# Patient Record
Sex: Female | Born: 1940
Health system: Southern US, Community
[De-identification: ages and names within clinical notes are randomized; demographics above are authoritative.]

## PROBLEM LIST (undated history)

## (undated) DIAGNOSIS — K56609 Unspecified intestinal obstruction, unspecified as to partial versus complete obstruction: Secondary | ICD-10-CM

## (undated) DIAGNOSIS — R51 Headache: Secondary | ICD-10-CM

## (undated) DIAGNOSIS — M545 Low back pain, unspecified: Secondary | ICD-10-CM

## (undated) DIAGNOSIS — E785 Hyperlipidemia, unspecified: Secondary | ICD-10-CM

## (undated) DIAGNOSIS — G8929 Other chronic pain: Secondary | ICD-10-CM

## (undated) DIAGNOSIS — K219 Gastro-esophageal reflux disease without esophagitis: Secondary | ICD-10-CM

## (undated) DIAGNOSIS — M1990 Unspecified osteoarthritis, unspecified site: Secondary | ICD-10-CM

## (undated) DIAGNOSIS — I10 Essential (primary) hypertension: Secondary | ICD-10-CM

## (undated) HISTORY — DX: Gastro-esophageal reflux disease without esophagitis: K21.9

## (undated) HISTORY — PX: INGUINAL HERNIA REPAIR: SUR1180

## (undated) HISTORY — DX: Hyperlipidemia, unspecified: E78.5

## (undated) HISTORY — DX: Essential (primary) hypertension: I10

---

## 1979-06-08 HISTORY — PX: APPENDECTOMY: SHX54

## 1979-06-08 HISTORY — PX: ABDOMINAL HYSTERECTOMY: SHX81

## 1998-10-07 HISTORY — PX: BILATERAL OOPHORECTOMY: SHX1221

## 1999-07-31 ENCOUNTER — Ambulatory Visit: Admission: RE | Admit: 1999-07-31 | Discharge: 1999-07-31 | Payer: Self-pay | Admitting: Gynecology

## 1999-08-01 ENCOUNTER — Inpatient Hospital Stay (HOSPITAL_COMMUNITY): Admission: RE | Admit: 1999-08-01 | Discharge: 1999-08-03 | Payer: Self-pay | Admitting: Gynecology

## 1999-08-01 ENCOUNTER — Encounter (INDEPENDENT_AMBULATORY_CARE_PROVIDER_SITE_OTHER): Payer: Self-pay

## 1999-08-04 ENCOUNTER — Inpatient Hospital Stay (HOSPITAL_COMMUNITY): Admission: EM | Admit: 1999-08-04 | Discharge: 1999-08-07 | Payer: Self-pay | Admitting: Gynecology

## 1999-08-04 ENCOUNTER — Encounter: Payer: Self-pay | Admitting: Gynecology

## 1999-12-03 ENCOUNTER — Emergency Department (HOSPITAL_COMMUNITY): Admission: EM | Admit: 1999-12-03 | Discharge: 1999-12-03 | Payer: Self-pay | Admitting: Internal Medicine

## 2003-10-09 ENCOUNTER — Inpatient Hospital Stay (HOSPITAL_COMMUNITY): Admission: EM | Admit: 2003-10-09 | Discharge: 2003-10-16 | Payer: Self-pay | Admitting: Emergency Medicine

## 2003-10-09 ENCOUNTER — Encounter (INDEPENDENT_AMBULATORY_CARE_PROVIDER_SITE_OTHER): Payer: Self-pay | Admitting: Specialist

## 2003-10-09 HISTORY — PX: COLON SURGERY: SHX602

## 2005-05-15 ENCOUNTER — Ambulatory Visit: Payer: Self-pay | Admitting: Cardiology

## 2005-09-15 ENCOUNTER — Emergency Department (HOSPITAL_COMMUNITY): Admission: EM | Admit: 2005-09-15 | Discharge: 2005-09-16 | Payer: Self-pay | Admitting: Emergency Medicine

## 2005-10-18 ENCOUNTER — Inpatient Hospital Stay (HOSPITAL_COMMUNITY): Admission: EM | Admit: 2005-10-18 | Discharge: 2005-10-23 | Payer: Self-pay | Admitting: Emergency Medicine

## 2008-10-24 ENCOUNTER — Inpatient Hospital Stay (HOSPITAL_COMMUNITY): Admission: EM | Admit: 2008-10-24 | Discharge: 2008-11-02 | Payer: Self-pay | Admitting: Emergency Medicine

## 2008-10-26 ENCOUNTER — Ambulatory Visit: Payer: Self-pay | Admitting: Internal Medicine

## 2010-04-15 ENCOUNTER — Inpatient Hospital Stay (HOSPITAL_COMMUNITY): Admission: EM | Admit: 2010-04-15 | Discharge: 2010-04-21 | Payer: Self-pay | Admitting: Emergency Medicine

## 2010-12-22 LAB — BASIC METABOLIC PANEL
BUN: 9 mg/dL (ref 6–23)
Chloride: 104 mEq/L (ref 96–112)
Creatinine, Ser: 0.82 mg/dL (ref 0.4–1.2)
GFR calc Af Amer: >60 mL/min (ref >60)
Sodium: 141 mEq/L (ref 135–145)

## 2010-12-23 LAB — COMPREHENSIVE METABOLIC PANEL
ALT: 16 U/L (ref 0–35)
ALT: 23 U/L (ref 0–35)
AST: 16 U/L (ref 0–37)
AST: 20 U/L (ref 0–37)
AST: 27 U/L (ref 0–37)
Albumin: 2.6 g/dL — ABNORMAL LOW (ref 3.5–5.2)
Albumin: 3.9 g/dL (ref 3.5–5.2)
Alkaline Phosphatase: 56 U/L (ref 39–117)
Alkaline Phosphatase: 65 U/L (ref 39–117)
Alkaline Phosphatase: 81 U/L (ref 39–117)
CO2: 28 mEq/L (ref 19–32)
CO2: 29 mEq/L (ref 19–32)
Calcium: 8.6 mg/dL (ref 8.4–10.5)
Chloride: 108 mEq/L (ref 96–112)
Creatinine, Ser: 0.69 mg/dL (ref 0.4–1.2)
Creatinine, Ser: 0.95 mg/dL (ref 0.4–1.2)
GFR calc Af Amer: >60 mL/min (ref >60)
GFR calc Af Amer: >60 mL/min (ref >60)
GFR calc non Af Amer: >60 mL/min (ref >60)
GFR calc non Af Amer: >60 mL/min (ref >60)
Potassium: 3.1 mEq/L — ABNORMAL LOW (ref 3.5–5.1)
Potassium: 3.9 mEq/L (ref 3.5–5.1)
Sodium: 141 mEq/L (ref 135–145)
Sodium: 142 mEq/L (ref 135–145)
Total Bilirubin: 0.4 mg/dL (ref 0.3–1.2)
Total Bilirubin: 0.4 mg/dL (ref 0.3–1.2)
Total Bilirubin: 0.5 mg/dL (ref 0.3–1.2)
Total Protein: 6.3 g/dL (ref 6.0–8.3)

## 2010-12-23 LAB — LIPASE, BLOOD: Lipase: 26 U/L (ref 11–59)

## 2010-12-23 LAB — URINALYSIS, ROUTINE W REFLEX MICROSCOPIC
Hgb urine dipstick: NEGATIVE
Ketones, ur: NEGATIVE mg/dL
Nitrite: NEGATIVE
Specific Gravity, Urine: 1.024 (ref 1.005–1.030)
pH: 6 (ref 5.0–8.0)

## 2010-12-23 LAB — CBC
HCT: 36.4 % (ref 36.0–46.0)
HCT: 36.8 % (ref 36.0–46.0)
Hemoglobin: 12.4 g/dL (ref 12.0–15.0)
Hemoglobin: 9.8 g/dL — ABNORMAL LOW (ref 12.0–15.0)
MCH: 30.5 pg (ref 26.0–34.0)
MCHC: 33.8 g/dL (ref 30.0–36.0)
MCHC: 34.2 g/dL (ref 30.0–36.0)
MCV: 89.2 fL (ref 78.0–100.0)
Platelets: 150 10*3/uL (ref 150–400)
Platelets: 169 10*3/uL (ref 150–400)
RBC: 3.27 MIL/uL — ABNORMAL LOW (ref 3.87–5.11)
RBC: 4.07 MIL/uL (ref 3.87–5.11)
RDW: 13.6 % (ref 11.5–15.5)
WBC: 6.9 10*3/uL (ref 4.0–10.5)

## 2010-12-23 LAB — BASIC METABOLIC PANEL
BUN: 11 mg/dL (ref 6–23)
Creatinine, Ser: 0.68 mg/dL (ref 0.4–1.2)
GFR calc non Af Amer: >60 mL/min (ref >60)
Glucose, Bld: 99 mg/dL (ref 70–99)
Potassium: 3.3 mEq/L — ABNORMAL LOW (ref 3.5–5.1)

## 2010-12-23 LAB — DIFFERENTIAL
Basophils Absolute: 0 10*3/uL (ref 0.0–0.1)
Eosinophils Relative: 0 % (ref 0–5)
Neutrophils Relative %: 90 % — ABNORMAL HIGH (ref 43–77)

## 2011-01-21 LAB — PHOSPHORUS: Phosphorus: 1.6 mg/dL — ABNORMAL LOW (ref 2.3–4.6)

## 2011-01-21 LAB — BASIC METABOLIC PANEL
BUN: 13 mg/dL (ref 6–23)
BUN: 2 mg/dL — ABNORMAL LOW (ref 6–23)
BUN: 5 mg/dL — ABNORMAL LOW (ref 6–23)
CO2: 24 mEq/L (ref 19–32)
CO2: 27 mEq/L (ref 19–32)
CO2: 29 mEq/L (ref 19–32)
CO2: 31 mEq/L (ref 19–32)
Calcium: 7.9 mg/dL — ABNORMAL LOW (ref 8.4–10.5)
Calcium: 8.1 mg/dL — ABNORMAL LOW (ref 8.4–10.5)
Calcium: 8.3 mg/dL — ABNORMAL LOW (ref 8.4–10.5)
Chloride: 102 mEq/L (ref 96–112)
Chloride: 106 mEq/L (ref 96–112)
Chloride: 99 mEq/L (ref 96–112)
Creatinine, Ser: 0.68 mg/dL (ref 0.4–1.2)
GFR calc Af Amer: >60 mL/min (ref >60)
GFR calc Af Amer: >60 mL/min (ref >60)
GFR calc Af Amer: >60 mL/min (ref >60)
GFR calc non Af Amer: >60 mL/min (ref >60)
GFR calc non Af Amer: >60 mL/min (ref >60)
GFR calc non Af Amer: >60 mL/min (ref >60)
Glucose, Bld: 115 mg/dL — ABNORMAL HIGH (ref 70–99)
Glucose, Bld: 116 mg/dL — ABNORMAL HIGH (ref 70–99)
Glucose, Bld: 123 mg/dL — ABNORMAL HIGH (ref 70–99)
Glucose, Bld: 126 mg/dL — ABNORMAL HIGH (ref 70–99)
Potassium: 3.2 mEq/L — ABNORMAL LOW (ref 3.5–5.1)
Potassium: 3.3 mEq/L — ABNORMAL LOW (ref 3.5–5.1)
Potassium: 4.1 mEq/L (ref 3.5–5.1)
Sodium: 134 mEq/L — ABNORMAL LOW (ref 135–145)
Sodium: 136 mEq/L (ref 135–145)
Sodium: 137 mEq/L (ref 135–145)
Sodium: 139 mEq/L (ref 135–145)

## 2011-01-21 LAB — URINALYSIS, ROUTINE W REFLEX MICROSCOPIC
Bilirubin Urine: NEGATIVE
Glucose, UA: NEGATIVE mg/dL
Hgb urine dipstick: NEGATIVE
Hgb urine dipstick: NEGATIVE
Ketones, ur: NEGATIVE mg/dL
Protein, ur: NEGATIVE mg/dL
Specific Gravity, Urine: 1.025 (ref 1.005–1.030)
Specific Gravity, Urine: 1.031 — ABNORMAL HIGH (ref 1.005–1.030)
Urobilinogen, UA: 0.2 mg/dL (ref 0.0–1.0)
pH: 7 (ref 5.0–8.0)

## 2011-01-21 LAB — COMPREHENSIVE METABOLIC PANEL
ALT: 20 U/L (ref 0–35)
AST: 25 U/L (ref 0–37)
CO2: 26 mEq/L (ref 19–32)
CO2: 27 mEq/L (ref 19–32)
Calcium: 9.1 mg/dL (ref 8.4–10.5)
Calcium: 9.9 mg/dL (ref 8.4–10.5)
Chloride: 102 mEq/L (ref 96–112)
Creatinine, Ser: 0.76 mg/dL (ref 0.4–1.2)
GFR calc Af Amer: >60 mL/min (ref >60)
GFR calc non Af Amer: 50 mL/min — ABNORMAL LOW (ref >60)
GFR calc non Af Amer: >60 mL/min (ref >60)
Glucose, Bld: 142 mg/dL — ABNORMAL HIGH (ref 70–99)
Sodium: 139 mEq/L (ref 135–145)

## 2011-01-21 LAB — CBC
HCT: 31.3 % — ABNORMAL LOW (ref 36.0–46.0)
HCT: 31.8 % — ABNORMAL LOW (ref 36.0–46.0)
HCT: 32 % — ABNORMAL LOW (ref 36.0–46.0)
Hemoglobin: 10.1 g/dL — ABNORMAL LOW (ref 12.0–15.0)
Hemoglobin: 10.4 g/dL — ABNORMAL LOW (ref 12.0–15.0)
Hemoglobin: 10.8 g/dL — ABNORMAL LOW (ref 12.0–15.0)
Hemoglobin: 10.9 g/dL — ABNORMAL LOW (ref 12.0–15.0)
Hemoglobin: 12.8 g/dL (ref 12.0–15.0)
MCHC: 33.1 g/dL (ref 30.0–36.0)
MCHC: 33.2 g/dL (ref 30.0–36.0)
MCHC: 33.4 g/dL (ref 30.0–36.0)
MCHC: 33.8 g/dL (ref 30.0–36.0)
MCV: 86.8 fL (ref 78.0–100.0)
MCV: 88.1 fL (ref 78.0–100.0)
MCV: 88.4 fL (ref 78.0–100.0)
MCV: 89.1 fL (ref 78.0–100.0)
Platelets: 182 10*3/uL (ref 150–400)
Platelets: 258 10*3/uL (ref 150–400)
RBC: 3.54 MIL/uL — ABNORMAL LOW (ref 3.87–5.11)
RBC: 3.63 MIL/uL — ABNORMAL LOW (ref 3.87–5.11)
RBC: 3.98 MIL/uL (ref 3.87–5.11)
RBC: 4.32 MIL/uL (ref 3.87–5.11)
RDW: 13.1 % (ref 11.5–15.5)
RDW: 13.3 % (ref 11.5–15.5)
RDW: 13.3 % (ref 11.5–15.5)
RDW: 13.5 % (ref 11.5–15.5)
WBC: 7.2 10*3/uL (ref 4.0–10.5)
WBC: 8 10*3/uL (ref 4.0–10.5)

## 2011-01-21 LAB — DIFFERENTIAL
Lymphocytes Relative: 10 % — ABNORMAL LOW (ref 12–46)
Lymphs Abs: 1.1 10*3/uL (ref 0.7–4.0)
Neutro Abs: 10 10*3/uL — ABNORMAL HIGH (ref 1.7–7.7)
Neutrophils Relative %: 87 % — ABNORMAL HIGH (ref 43–77)

## 2011-01-21 LAB — HEPATIC FUNCTION PANEL
Alkaline Phosphatase: 52 U/L (ref 39–117)
Indirect Bilirubin: 0.3 mg/dL (ref 0.3–0.9)
Total Bilirubin: 0.5 mg/dL (ref 0.3–1.2)

## 2011-01-21 LAB — GASTRIC OCCULT BLOOD (1-CARD TO LAB): Occult Blood, Gastric: POSITIVE — AB

## 2011-01-21 LAB — MAGNESIUM: Magnesium: 1.9 mg/dL (ref 1.5–2.5)

## 2011-01-21 LAB — HEMOGLOBIN AND HEMATOCRIT, BLOOD: HCT: 30.6 % — ABNORMAL LOW (ref 36.0–46.0)

## 2011-01-21 LAB — LIPASE, BLOOD: Lipase: 20 U/L (ref 11–59)

## 2011-02-19 NOTE — Consult Note (Signed)
NAMEZULEYKA, Stacey [name] NO.:  [id number]   MEDICAL RECORD NO.:  [id number]          PATIENT TYPE:  INP   LOCATION:  5151                         FACILITY:  [facility]   PHYSICIAN:  Adolph Pollack, M.D.DATE OF BIRTH:  1941/01/11   DATE OF CONSULTATION:  10/26/2008  DATE OF DISCHARGE:                                 CONSULTATION   REQUESTING PHYSICIAN:  Monte Fantasia, MD   CONSULTING SURGEON:  Adolph Pollack, MD   REASON FOR CONSULTATION:  Partial small bowel obstruction.   HISTORY OF PRESENT ILLNESS:  Stacey [name] is a 70 year old white female  who has a history of hypertension as well as an incarcerated ventral  hernia with small bowel obstruction that was repaired via exploratory  laparotomy in 2007.  Since this surgery, she has not had any problems  with her abdomen until this past Sunday.  After eating dinner, she got  very bloated and uncomfortable.  As a night progressed, the patient had  worsening nausea and vomiting.  On Monday morning, the patient presented  to [facility] Emergency Department and at that time, a CT of the abdomen and  pelvis was obtained, which showed mild small bowel ileus versus mild  partial small bowel obstruction.  At this time, the patient was admitted  and an NG tube was placed.  Repeat abdominal x-rays were obtained the  following day, which showed stable changes in the patient's abdomen.  At  this time secondary to the patient's partial small bowel obstruction, we  were consulted.   REVIEW OF SYSTEMS:  Please see HPI.  Otherwise all other systems are  negative.   PAST MEDICAL HISTORY:  Significant for:  1. Small bowel obstruction, status post closure of lower abdominal      wound ventral hernia with a hernia patch by Dr. Ezzard Standing.  2. Hypertension.   PAST SURGICAL HISTORY:  1. Status post closure of lower abdominal ventral hernia in two layers      with a subfascial placement of medium Ventralex Bard hernia patch      and  then primary closure of abdominal fascia.  2. Laparotomy with extensive lysis of adhesions, small bowel      resection, and repair of incisional hernia by Dr. Johna Sheriff.  3. Status post hysterectomy.   SOCIAL HISTORY:  The patient denies alcohol.  However, reports half a  pack of tobacco use daily.   ALLERGIES:  1. CEPHALOSPORINS.  2. DEMEROL.  3. LEVOFLOXACIN.   MEDICATIONS:  The patient does not take any home medications.   PHYSICAL EXAMINATION:  GENERAL:  Stacey [name] is a 70 year old white  female who is currently lying in bed in no acute distress.  VITAL SIGNS:  Temperature 98.5, pulse 86, respirations 18, and blood  pressure is 113/69.  HEENT:  Head is normocephalic and atraumatic.  Sclerae noninjected.  Pupils are equal, round, and reactive to light.  Ears and nose without  any obvious masses or lesions.  No rhinorrhea.  The patient does have an  NG tube in her nose with bilious output.  Otherwise, her mouth is pink  and  moist and throat shows no exudate.  HEART:  Regular rate and rhythm.  Normal S1 and S2.  No murmurs,  gallops, or rubs are noted, +2 carotid, radial, and pedal pulses  bilaterally.  LUNGS:  Clear to auscultation bilaterally with no wheezes, rhonchi, or  rales noted.  RESPIRATORY:  Effort is nonlabored.  ABDOMEN:  Soft, mildly distended with no bowel sounds.  The patient is  tender along her midline incision and somewhat in the right lower  quadrant.  Otherwise, the patient does have a midline incision from her  prior surgeries.  MUSCULOSKELETAL:  All four extremities are symmetrical with no cyanosis,  clubbing, or edema.  PSYCHIATRIC:  The patient is alert and oriented x3 with an appropriate  affect.   LABORATORY DATA:  White blood cell count is 7500, hemoglobin is 10.8,  hematocrit 32, platelet count is 154,000.  Sodium 136, potassium 3.2,  glucose 129, BUN 23, creatinine 0.85.   DIAGNOSTICS:  CT scan of the abdomen and pelvis reveals mild small  bowel  ileus or partial obstruction and minimal free peritoneal fluid.  Acute  abdominal series on October 25, 2008, shows new small left pleural  effusion with persistent bibasilar atelectasis as well as relatively  gasless abdomen, shows scattered short air-fluid levels with a bowel gas  pattern, which has not markedly changed from the day prior.   IMPRESSION:  1. Partial small bowel obstruction versus ileus.  2. Hypertension.  3. Hypokalemia.   PLAN:  At this time, we agree with conservative management and the  patient continuing with an NG tube as she is not passing any flatus or  having bowel movements at this time.  At this time, the patient does not  appear septic or does not appear to have any need for emergent or urgent  surgical intervention.  Many times an NG tube as well as bowel rest and  conservative management will correct this problem.  I agree with further  x-ray of the abdomen tonight and we will continue to follow this patient  along with you and make further recommendations at that time.      Letha Cape, PA      Adolph Pollack, M.D.  Electronically Signed    KEO/MEDQ  D:  10/26/2008  T:  10/27/2008  Job:  [id number]   cc:   Monte Fantasia, MD

## 2011-02-19 NOTE — Discharge Summary (Signed)
Stacey [name], Stacey [name] NO.:  [id number]   MEDICAL RECORD NO.:  [id number]          PATIENT TYPE:  INP   LOCATION:  5152                         FACILITY:  [facility]   PHYSICIAN:  Monte Fantasia, MD  DATE OF BIRTH:  1941/08/26   DATE OF ADMISSION:  10/24/2008  DATE OF DISCHARGE:                               DISCHARGE SUMMARY   PRIMARY CARE PHYSICIAN:  Sandria Bales. Ezzard Standing, MD   INTERIM DIAGNOSES:  1. Partial small bowel obstruction.  2. Hypokalemia, which is resolved.  3. Hypertension.  4. Hemoccult positive stools.   Discharge medications would be dictated finally at the time of final  discharge.   COURSE DURING THE HOSPITAL STAY:  The patient is a 70 year old Caucasian  lady with history of hypertension and history of incarcerated ventral  hernia with small bowel obstruction, repaired with exploratory  laparotomy in 2007, was admitted on October 24, 2008 with complaints of  abdominal pain.  On admission, abdominal CT scan showed small bowel  obstruction with no evidence of hernia or incarceration.  The patient  was evaluated by Surgery and followed closely by Surgery for the same,  was put on conservative line of management with NG tube.  Repeated  abdominal x-rays were obtained and followed very closely and gradually  started on clear liquids, which she tolerated well.  The patient was  advanced to soft to full diet today and has moved her bowels today.  The  patient denies any abdominal complaints at present and if the patient  tolerates regular diet today, would be possible discharge in a.m.   The patient was also found to be hypokalemic and hyperphosphatemic  during the course of admission.  Potassium and phosphorus both were  repleted for the same.  Also GI evaluation was called in for Hemoccult  positive stools, was recommended to give PPI b.i.d. as an outpatient,  colonoscopy for routine screening.  The Hemoccult positive stools were  possibly secondary  to the small bowel obstruction and to monitor H and H  closely.  The patient did not have any further drops in H and H and  there were no further GI recommendations.   RADIOLOGICAL STUDIES DONE DURING THE STAY IN THE HOSPITAL:  1. Abdominal x-ray done on October 24, 2008.  Impression,      nonobstructive bowel gas pattern.  2. CT scan of the abdomen done on October 24, 2008.  Impression, mild      small bowel ileus with partial obstruction, minimal free peritoneal      fluid.  3. CT of the pelvis.  Impression, mild small bowel ileus, a partial      obstruction, small cystocele.  4. Abdominal x-ray done on October 25, 2008.  Impression, new small      left pleural effusion with persistent bibasilar atelectasis.      Relatively gasless abdomen shows only scattered fluid levels, bowel      gas pattern not markedly changed.  5. Abdominal x-ray done on October 26, 2008.  Impression, questionable      thickening of the splenic flexure of the colon.  6. Abdominal x-ray done on October 28, 2008.  Impression, NG tube      removed, small bowel obstruction gas pattern worsened.  7. Abdominal x-ray done on October 29, 2008.  Impression, nonspecific      bowel gas pattern.  There is gas in the small and large bowel at      least one dilated small bowel loop identified.  Findings not      suggestive for a high-grade obstruction, but there may be residual      partial obstruction or ileus, bibasilar lung opacification      concerning for areas of consolidation and pleural fluid.  8. Abdominal x-ray done on October 31, 2008.  Impression, improvement      of small bowel distention.   Labs done on November 01, 2008; total WBC 6.8, hemoglobin 10.9,  hematocrit 31.8, platelets of 258.  Sodium 139, potassium 4.1, chloride  106, bicarb 26, glucose 126, BUN 2, creatinine 0.7, calcium 8.9,  magnesium 1.9, phosphorus 2.4.   ASSESSMENT AND PLAN:  The patient's small bowel obstruction is gradually  resolving  as been advanced with her to soft to regular referral diet  today.  As per surgery recommendations, the patient denies of any  abdominal cramping at present, also abdominal x-rays have improved.  We  will follow up with as per surgery recommendations and may be possible  discharge in a.m.  We will continue with Protonix 40 mg p.o. b.i.d.      Monte Fantasia, MD  Electronically Signed     MP/MEDQ  D:  11/01/2008  T:  11/02/2008  Job:  [id number]

## 2011-02-19 NOTE — H&P (Signed)
Stacey [name], Stacey [name] NO.:  [id number]   MEDICAL RECORD NO.:  [id number]          PATIENT TYPE:  INP   LOCATION:  5151                         FACILITY:  [facility]   PHYSICIAN:  Renee Ramus, MD       DATE OF BIRTH:  Jul 16, 1941   DATE OF ADMISSION:  10/24/2008  DATE OF DISCHARGE:                              HISTORY & PHYSICAL   PRIMARY CARE PHYSICIAN:  Sandria Bales. Ezzard Standing, MD   HISTORY OF PRESENT ILLNESS:  The patient is a 70 year old female with  abdominal pain.  The patient does have a previous history of small-bowel  obstruction secondary to incarcerated hernia.  She is status post  surgical repair for this.  The patient did present with acute nausea,  vomiting, and abdominal pain consistent with a previous symptoms.  The  patient had abdominal CT scan that showed a mild small bowel obstruction  with no evidence of hernia or incarceration.  The patient denies chest  pain, PND, orthopnea, fevers, chills, or night sweats.  The patient now  has an NG tube placed and is resting comfortably.   PAST MEDICAL HISTORY:  1. Small bowel obstruction status post reduction of ventral hernia.  2. Hypertension.   SOCIAL HISTORY:  The patient denies alcohol, but reports one-half pack  per day tobacco usage.   FAMILY HISTORY:  Not available.   REVIEW OF SYSTEMS:  All other comprehensive review of systems are  negative.   Medications currently, the patient has allergies to CEPHALOSPORINS and  DEMEROL.  Per her chart, the patient has allergies to LEVOFLOXACIN, but  takes no home medications.   PHYSICAL EXAMINATION:  GENERAL:  This is a well-developed, well-  nourished white female currently in no apparent distress.  VITAL SIGNS:  Blood pressure 125/87, heart rate 90, respiratory rate 18,  temperature 97.  HEENT:  No jugular venous distention or lymphadenopathy.  Oropharynx is  clear.  Mucous membranes pink and moist.  TMs clear bilaterally.  Pupils  equal and reactive to  light and accommodation.  Extraocular muscles  appear to be intact.  CARDIOVASCULAR:  Regular rate and rhythm, albeit tachy but no murmurs,  rubs, or gallops.  PULMONARY:  Lungs clear to auscultation bilaterally.  ABDOMEN:  Soft, but no appreciable bowel sounds.  She has no rebound.  She has no guarding.  EXTREMITIES:  No clubbing, cyanosis, or edema.  She has good peripheral  pulses, dorsalis pedis and radial arteries.  She is able to move all  extremities.  NEUROLOGIC:  Cranial nerves II-XII are grossly intact.  She has no focal  neurological deficit.   STUDIES:  Abdominal CT scan showing mild small bowel ileus.   LABORATORY DATA:  White count 11.5, H&H 12.8 and 38.5, MCV 89, platelets  190.  Sodium 135, potassium 4.2, chloride 98, bicarb 27, BUN 15,  creatinine 0.76, glucose 142, lipase of 20.  UA showing no evidence of  urinary tract infection, but elevated specific gravity of 1.025.  Occult  blood noted on stool sample.   ASSESSMENT AND PLAN:  1. Small-bowel obstruction.  The patient will be made  n.p.o.  She will      be given pain meds.  She has nasogastric tube placed.  We will      reevaluate in a.m.  We will defer surgical consult at this time      since we are hopeful that this will resolve spontaneously.  2. Tobacco history.  The patient declines patch.  3. Fecal occult blood positive.  The patient has no evidence of anemia      and I do not believe this represents acute gastrointestinal bleed.      This may be secondary to ischemia from the bowel obstruction.  We      will continue to follow and defer treatment at this time.  We will      place the patient on IV Protonix; however, and rehydrate with IV      fluids.   DISPOSITION:  Hopeful for discharge in 2-3 days.  The patient is full  code.  H&P was constructed by reviewing past medical history, conferring  with emergency medical room physician, and reviewing the emergency  medical record.   TIME SPENT:  One  hour.      Renee Ramus, MD  Electronically Signed     JF/MEDQ  D:  10/24/2008  T:  10/25/2008  Job:  [id number]   cc:   Sandria Bales. Ezzard Standing, M.D.

## 2011-02-19 NOTE — Discharge Summary (Signed)
Stacey [name], Stacey [name] NO.:  [id number]   MEDICAL RECORD NO.:  [id number]          PATIENT TYPE:  INP   LOCATION:  5152                         FACILITY:  [facility]   PHYSICIAN:  Marcellus Scott, MD     DATE OF BIRTH:  1940-10-23   DATE OF ADMISSION:  10/24/2008  DATE OF DISCHARGE:  11/02/2008                               DISCHARGE SUMMARY   ADDENDUM:   PRIMARY MEDICAL DOCTOR:  [facility].   This is an addendum to the interim discharge summary that was done by  Dr. Kathryne Hitch on November 01, 2008 and will update events since   DISCHARGE DIAGNOSES:  1. Partial small-bowel obstruction - resolved.  2. Hypertension - controlled off medications.  3. Normocytic anemia and fecal occult blood testing positive.  For      outpatient GI workup.  4. Hypokalemia and hypophosphatemia - resolved.   DISCHARGE MEDICATIONS:  1. Enteric-coated aspirin 81 mg p.o. daily.  2. Prilosec over-the-counter 20 mg p.o. b.i.d.   PROCEDURES SINCE YESTERDAY:  None.   PERTINENT LABORATORIES SINCE YESTERDAY:  None.   Basic metabolic panel on November 01, 2008 unremarkable.  BUN 2,  creatinine 0.71.  CBCs on November 01, 2008 with hemoglobin 10.9,  hematocrit 31.8, white blood cells 6.8, platelets 258.   HOSPITAL COURSE AND PATIENT DISPOSITION:  The patient today indicates  that she feels fine.  She is tolerating an advanced diet.  She has no  nausea, vomiting or abdominal pain.  She has had a small bowel movement  today.  Surgery has evaluated her and cleared her for discharge home to  follow up with her primary care physician.  The patient indicates she  takes a baby aspirin daily for a ? heart condition/palpitations.  The  patient is stable at this  time to discharge home.  She indicates that she would rather buy the  over-the-counter Prilosec rather than fill her Protonix secondary to  expense.  She has been advised to call Dr. Marvell Fuller office for an  appointment  for outpatient GI evaluation.  She is also to call her  primary medical doctor to follow up within a week or two with repeat  CBCs.      Marcellus Scott, MD  Electronically Signed     AH/MEDQ  D:  11/02/2008  T:  11/02/2008  Job:  [id number]   cc:   Ernestina Penna, M.D.  Iva Boop, MD,FACG  Sandria Bales. Ezzard Standing, M.D.

## 2011-02-22 NOTE — Discharge Summary (Signed)
NAME:  Stacey [name], Stacey [name]                      ACCOUNT NO.:  [id number]   MEDICAL RECORD NO.:  [id number]                   PATIENT TYPE:  INP   LOCATION:  5705                                 FACILITY:  [facility]   PHYSICIAN:  Sharlet Salina T. Hoxworth, M.D.          DATE OF BIRTH:  11-Jan-1941   DATE OF ADMISSION:  10/09/2003  DATE OF DISCHARGE:  10/16/2003                                 DISCHARGE SUMMARY   DISCHARGE DIAGNOSES:  Incarcerated incisional hernia with small-bowel  obstruction.   SURGICAL PROCEDURE:  Repair of incisional hernia with lysis of adhesions and  small bowel resection on October 09, 2003.   HISTORY OF PRESENT ILLNESS:  Stacey [name] is a 70 year old white female  with an 18-hour history of mid-abdominal pain and nausea and vomiting, with  the latter starting after arrival in the emergency room.  She has a several-  month history of intermittent abdominal pain and bloating following meals.   PAST MEDICAL HISTORY:  Surgery includes TAH/BSO.  Medical history  significant for hypertension.   MEDICATIONS:  1. Zestril daily.  2. Lasix 40 mg daily.   ALLERGIES:  She is allergic to CEPHALOSPORIN, which she says cause nausea.   SOCIAL HISTORY/FAMILY HISTORY/REVIEW OF SYSTEMS:  See detailed H&P.   PHYSICAL EXAM:  VITAL SIGNS:  She is afebrile, vital signs all within normal  limits.  ABDOMEN:  Pertinent findings were limited to the abdomen which showed mild  distention.  There is a healed low midline incision.  There is tenderness  along the incision but no definite palpable mass.  Bowel sounds are  hypoactive.  There is mild diffuse tenderness.   LABORATORY:  White count elevated at 13,900. hemoglobin 12.  Electrolytes  all within normal limits.  Glucose 146.  Lipase 30.   CT scan of the abdomen and pelvis was obtained through the emergency room,  which shows apparent loop of small bowel incarcerated throughout the low  midline incision with small-bowel obstruction  and fairly marked thickening  in the more proximal small bowel.   HOSPITAL COURSE:  The patient was felt to have small-bowel obstruction  secondary to incarcerated incisional hernia and was taken immediately to the  operating room after administering IV antibiotics.  She underwent repair of  incarcerated hernia with a short-segment small bowel resection and lysis of  adhesions.  Postoperative course was relatively smooth.  She had some  decreased urine output and tachycardia in the first postoperative day that  responded to fluids.  White count was 16,000 immediately postoperatively.  By the third postop day, she had active bowel sounds and white count was  decreased to 13,000.  Her NG tube was discontinued.  She had some mild  nausea on the fourth postoperative day and remained n.p.o.  By the fifth  postoperative day, her nausea had resolved and she was started on clear  liquids.  She tolerated this well and was able to be advanced to a  regular  diet.  Her bowels did move prior to discharge.  The abdomen was soft and  nontender.  Wound was healing well.  She was discharged home on October 16, 2003.   DISCHARGE MEDICATIONS:  Discharge medications were the same as admission  plus Vicodin for pain.   FOLLOWUP:  Followup is to be in my office in 1 to 2 weeks.                                                Lorne Skeens. Hoxworth, M.D.    Stacey [name]  D:  11/23/2003  T:  11/24/2003  Job:  [id number]

## 2011-02-22 NOTE — Op Note (Signed)
[name], [name] NO.:  [id number]   MEDICAL RECORD NO.:  [id number]          PATIENT TYPE:  INP   LOCATION:  5703                         FACILITY:  [facility]   PHYSICIAN:  Sandria Bales. Ezzard Standing, M.D.  DATE OF BIRTH:  [birth year]   DATE OF PROCEDURE:  10/19/2005  DATE OF DISCHARGE:                                 OPERATIVE REPORT   PREOPERATIVE DIAGNOSIS:  Incarcerated lower abdominal wall ventral hernia.   POSTOPERATIVE DIAGNOSIS:  Incarcerated lower abdominal wall ventral hernia  with a loop of small bowel, approximately a 4 cm defect.   PROCEDURE:  Closure of lower abdominal wall ventral hernia in two layers  with a subfascial placement of a medium Ventralex Bard hernia patch (2.5  inches x 2.5 inches), and then primary closure of the abdominal fascia.   SURGEON:  Sandria Bales. Ezzard Standing, M.D.   FIRST ASSISTANT:  None.   ANESTHESIA:  General endotracheal with approximately 30 mL of 0.25%  Marcaine.   COMPLICATIONS:  None.   NEED FOR PROCEDURE:  Stacey [name] is a 70 year old white female, who actually  presented to Dr. Toy Care in January of 2005, with a lower abdominal wall  hernia with incarcerated bowel, and required a small bowel resection by Dr.  Toy Care.  She then represented last night with a partial bowel obstruction  and another incarcerated lower abdominal wall hernia.  She now comes to the  operating room for repair of the hernia.   I discussed the operation with the patient and I reviewed the potential  complications of the procedure.  Particularly, complications include, but  are not limited to bleeding, infection, and the possibility of more bowel  resection and the risk of anothe hernia occuring.   OPERATIVE NOTE:  With the patient in a supine position.  She was given 1  gram of Ancef at the beginning of the procedure.  The abdomen was prepped  with Betadine solution after being shaved and sterilely draped.   A lump was immediately  palpable in  the suprapubic area in the midline.  It  was palpable, maybe 4 to 5 cm in diameter.  I cut down on top of this lump.  She had at least one loop of small bowel trapped in this hernia.  I reduced  the hernia back into the abdominal cavity.  I finger dissected some of the  adhesions down in the low pelvis, but certainly, did not do a generalized  enterolysis of adhesions, but I thought that I had taken care of the  obstructed bowel well.   I freed up around the subfascial space, by 4 to 5 cm in all directions, and  ended up using the medium Ventralex Bard hernia patch.  This was 2.5 x 2.5  inches in diameter.  The hernia defect proved to be about 4 cm in diameter.  So, I placed the mesh in.  I sewed it through the abdominal wall using a #1  Novofil suture in four quadrants and I closed the fascia in a longitudinal  fashion.  There seemed to be less tension closing the fascia longitudinally  than trying to close it transversely.  I closed this with interrupted #1  Novofil sutures.   She tolerated the procedure well.  I do not think I got into the intestines.  Again, I do not do a generalized enterolysis, but did try to do an  enterolysis locally of the adhesions around where this hernia and bowel  protruded out.  I placed the Ventralex and closed it primarily and closed  the subcutaneous tissues with 3-0 Vicryl sutures, the skin with a skin gun,  and sterilely dressed.  Sponge and needle count were correct at the end of  the case.   The patient tolerated the procedure well.  She was transferred to the  recovery room in good condition.      Sandria Bales. Ezzard Standing, M.D.  Electronically Signed     DHN/MEDQ  D:  10/19/2005  T:  10/21/2005  Job:  [id number]   cc:   Colon Flattery, D.O.  He has moved to [place].  This information was given by  his former office.

## 2011-02-22 NOTE — Discharge Summary (Signed)
[name], [name] NO.:  [id number]   MEDICAL RECORD NO.:  [id number]          PATIENT TYPE:  INP   LOCATION:  5703                         FACILITY:  [facility]   PHYSICIAN:  Sandria Bales. Ezzard Standing, M.D.  DATE OF BIRTH:  1941-01-19   DATE OF ADMISSION:  10/18/2005  DATE OF DISCHARGE:  10/23/2005                                 DISCHARGE SUMMARY   DISCHARGING PHYSICIAN:  Currie Paris, M.D.   PRIMARY CARE PHYSICIAN:  None.   CHIEF COMPLAINT AND REASON FOR ADMISSION:  Stacey [name] is a 70 year old  female patient with prior history of hypertension who had a 3-4 month  history of abdominal pain.  She has a prior history of small bowel resection  and abdominal wall hernia repair in 01/05 by Dr. Dillard Essex.  She initially  presented to the ER on 09/16/05 with complaints of abdominal pain and  nothing acute was noted.   On initial exam, the patient was afebrile.  BP was 136/66.  Her abdomen  demonstrated a lower abdominal wall hernia.  There was a question on  clinical exam if this was incarcerated.  Her white count was 14,200.  CT of  the abdomen and pelvis was obtained.  This was reviewed per Dr. Ezzard Standing and  Dr. Lyman Bishop.  The small bowel was noted to be protruding through the lower  abdominal wall hernia with a questionable partial obstruction.   The patient was subsequently admitted with the following diagnoses:  1.  Partial small bowel obstruction with ventral hernia.  2.  Hypertension.  3.  Social issues relating to poor finances, patient is unemployed and      husband has Alzheimer's disease and he is in a nursing home.   HOSPITAL COURSE:  1.  Small bowel obstruction.  Incarcerated lower abdominal wall ventral      hernia.  The patient was taken to the OR on 10/19/05 where she was found      to have an incarcerated lower abdominal wall ventral hernia.  That of      course showed defect.  She subsequently underwent closure of the      abdominal wall hernia with  a medium sized ventralex patch.  She      tolerated the procedure well.  Sent to the floor for recovery.   On postoperative day 1,  the patient was up walking without any complaints,  no flatus.  She was contained on NPO status.   By postoperative day 2 on January 15, she was complaining of hunger.  She  was having flatus again.  She was started on a clear liquid diet, advanced  to full liquids later in the day.  She tolerated.   The patient was noted to have soft lower extremity edema.  The patient  states she has had this in the past but because she is recent abdominal  surgery, ultrasound was obtained and there was no evidence of DVT,  superficial thrombus or Baker's cyst in the leg.   By 10/22/05, the patient was tolerating her diet of full liquids.  She was  advanced to  a soft diet without any trouble.  Abdominal incision were clean  dry and intact.  Staples were intact.  No drainage, no redness.  The patient  was using Vicodin for pain with good pain relief.   The patient carries a diagnosis of hypertension and was on Lisinopril prior  to admission.  The patient's systolic blood pressure has been in the low  90's since admission and Lisinopril has been placed on hold.  This was  continued after discharge.  The patient tells me she no longer has a primary  care physician and has difficulty with finances.  I have recommended that  she follow up in attempt to find a primary care physician. The rest of her  family lives in [place].  She may eventually require a living situation  in [place] with or near her family.  May need to follow up with social  services in [place] regarding any sliding scale rate or frequent  availability.   FINAL DISCHARGE DIAGNOSES:  1.  Status post ventral hernia repair with median ventralex patch secondary      to incarcerated lower abdominal wall hernia.  2.  Hypertension.  Controlled on meds.  3.  Poor social situation.   DISCHARGE  MEDICATIONS:  1.  Vicodin 5/500.  Take one pill every 4-6 hours as needed for pain.  2.  Zestril.  Hold due to low blood pressure.  3.  Aspirin 81 mg daily.   Return to work.  The patient to be out of work at least 6 weeks from date of  initial surgery.  This will be determined by Dr. Ezzard Standing after follow up.   ACTIVITIES:  Increase activity slowly.  May shower.  No lifting greater than  10 pounds for the next 4-6 weeks.   WOUND CARE:  The patient is to return to Dr. Allene Pyo office on Monday,  January 22, to have the staples removed.  The RN will follow with the actual  time of appointment.  We recommend that she also have a blood pressure  checked at the same time since we are holding the Zestril for low blood  pressure.   She has been instructed to attempt to find a primary care physician to  manage her nonsurgical problems. She is destitute and has no insurance, so I  am not sure how successful she will be.      Allison L. Rennis Harding, N.P.      Sandria Bales. Ezzard Standing, M.D.  Electronically Signed    ALE/MEDQ  D:  10/23/2005  T:  10/23/2005  Job:  [id number]   cc:   Sandria Bales. Ezzard Standing, M.D.  [address]  [place]  [place] [address]

## 2011-02-22 NOTE — Op Note (Signed)
NAME:  Stacey [name], Stacey [name]                      ACCOUNT NO.:  [id number]   MEDICAL RECORD NO.:  [id number]                   PATIENT TYPE:  INP   LOCATION:  5705                                 FACILITY:  [facility]   PHYSICIAN:  Sharlet Salina T. Hoxworth, M.D.          DATE OF BIRTH:  23-Feb-1941   DATE OF PROCEDURE:  10/09/2003  DATE OF DISCHARGE:                                 OPERATIVE REPORT   PREOPERATIVE DIAGNOSIS:  Incarcerated incisional hernia with small bowel  obstruction.   POSTOPERATIVE DIAGNOSIS:  Incarcerated incisional hernia with small bowel  obstruction.   PROCEDURE:  Laparotomy with extensive lysis of adhesions, small bowel  resection and repair of incisional hernia.   SURGEON:  Lorne Skeens. Hoxworth, M.D.   ASSISTANT:  Leonie Man, M.D.   ANESTHESIA:  General.   INDICATIONS:  Stacey [name] is a 70 year old white female who presents with  acute abdominal pain, nausea and vomiting.  She has been having episodic  distention and pain for some months.  She has a history of a previous  hysterectomy and then bilateral salpingo-oophorectomy through a low midline  incision.  CT scan has shown evidence of small bowel obstruction and what  appears to be a single loop of small bowel trapped in an incisional hernia  in the lower midline.  Laparotomy has been recommended and accepted. The  nature of the procedure, indications, risks of bleeding and infection,  cardiorespiratory complications were discussed and understood  preoperatively.  She is now brought to the operating room for this  procedure.   DESCRIPTION OF PROCEDURE:  The patient was brought to the operating room and  placed in the supine position on the operating table.  A general  endotracheal anesthesia was induced.  The nasogastric tube had been placed.  Foley catheter was placed. She was received preoperative antibiotics.  The  abdomen was widely sterilely prepped and draped.  The previous low midline  incision was  used and dissection carried down to the subcutaneous tissue in  the midline fascia using cautery.  I initially did not encounter a hernia.  The peritoneum was opened.  There were extensive adhesions of small bowel  and omentum to the anterior abdominal wall that were carefully taken down.  Adhesions were fairly wide spread into the upper abdomen and lateral  abdomen.  I was then able to completely free up the anterior abdominal wall  at this point of feeling the most inferior edge of the incision almost to  the suprapubic area.  There was a loop of small bowel that was trapped up in  a small incisional hernia.  This was completely free and reduced all the  bowel proximal.  This was dilated and there was some decompressed distal  bowel.  There were, however, extensive inner loop adhesions throughout the  small bowel more proximally down to the base of the mesentery.  These were  all completely lysed from the bowel, traced down to  the ileocecal valve and  all adhesions lysed.  At this point, we noted that there was about a 7 cm  area of terminal ileum that had been denuded of mesentery during the  dissection.  It was not obviously nonviable but I felt likely would be  ischemic, and I elected to resect this.  This short segment of terminal  ileum was resected by completing the division of the mesentery between  clamps and tying with 3-0 silk ties. A functional end-to-end anastomosis was  then created with the GI stapler and the common enterotomies closed and the  specimen removed with a single firing of the TA-60 stapler.  The mesenteric  defect was closed with interrupted silks.  Following this, the abdomen was  thoroughly irrigated with saline.  Completed hemostasis was assured and the  small bowel run from the ligament of Treitz to the ileocecal valve one more  time, and it was all completely free.  The viscera was returned to the  anatomic position.  A piece of Seprafilm mesh was then left  over the omentum  and small bowel anteriorly.  The fascia was then closed with running #1 PDS.  The hernia defect in the extreme inferior aspect of the incision was quite  small, and this was closed by freeing the fascial edges from the  subcutaneous tissue, closing with the PDS with an additional couple of  interrupted 0 Prolene sutures which did provide good closure under no  tension.  The subcutaneous tissue was then irrigated.  Skin closed with  staples.  Sponge, needle and instrument counts were correct.  Dry sterile  dressings were applied, and the patient taken to recovery in good condition.                                               Lorne Skeens. Hoxworth, M.D.    Tory Emerald  D:  10/09/2003  T:  10/09/2003  Job:  [id number]

## 2011-02-22 NOTE — H&P (Signed)
Stacey, [name] NO.:  [id number]   MEDICAL RECORD NO.:  [id number]          PATIENT TYPE:  EMS   LOCATION:  MAJO                         FACILITY:  [facility]   PHYSICIAN:  Sandria Bales. Ezzard Standing, M.D.  DATE OF BIRTH:  [birth year]   DATE OF ADMISSION:  10/18/2005  DATE OF DISCHARGE:                                HISTORY & PHYSICAL   HISTORY OF PRESENT ILLNESS:  Stacey [name] is a 70 year old white female who  identifies herself as a patient of Dr. Colon Flattery. However, Dr. Dewaine Conger  left for [place], [place], in the summer of 2006, so she really has no  identified medical doctor. She has seen Dr. Lewayne Bunting in the past who has  had helped her get some medicines, but she has only seen him on like a once  a year basis. She has had a three to four month history of vague abdominal  pain. She actually came to the emergency room on September 16, 2005, but was  sent home after nothing was really identified. Significantly, she had an  incarcerated incisional hernia with small bowel obstruction repaired by Dr.  Wilfrid Lund in January 2002 and that required both a repair of incisional  hernia and a small bowel resection.   Over the last 24 hours she has had increasing abdominal pain, some nausea  and vomiting, and came to the emergency room.   She denies any history of peptic ulcer disease, liver disease, pancreatic  disease, or colon disease. She has never had either an upper endoscopy or  colonoscopy. She kind of has a sad story.  She sounds like she is fairly  destitute. She cannot afford medical care and she has never worked. Her  husband is in a nursing home and has Alzheimer's and maybe Parkinson  disease. She is accompanied by one of her sons, Hedda Crumbley, who is at her  bedside.   PAST MEDICAL HISTORY:  She denies any allergies, but Dr. Odie Sera mentioned  something about a cephalosporin allergy.   CURRENT MEDICATIONS:  Zestril for hypertension.   REVIEW OF SYSTEMS:   NEUROLOGIC:  No history of seizure, loss of  consciousness.  PULMONARY:  No pneumonia or tuberculosis.  CARDIAC: Hypertension that she has had for maybe five years. She has had no  heart disease, heart catheterizations or chest pain.  GASTROINTESTINAL: See history of present illness.  UROLOGIC: History of kidney infections. Again, she has her son, Lorin Picket, at  her bedside.   PRIOR OPERATIONS:  She had a hysterectomy in 1983 and oophorectomy in 2000;  these were both done by Dr. Nicholas Lose, both for benign reasons. Dr. Odie Sera  performed small bowel resection.   PHYSICAL EXAMINATION:  VITAL SIGNS: Temperature 97.1, blood pressure 136/66,  pulse 98, respirations 18, saturations 97%.  GENERAL: She is a well-nourished white female, alert and cooperative.  HEENT: Unremarkable.  NECK: Supple. No masses or thyromegaly.  LUNGS: Clear to auscultation.  HEART: Regular rate and rhythm without murmurs or rubs.  BREASTS: I did not examine her breasts. She has never had a mammogram, and  she really needs to  get that updated.  One of the limits to the examine her is in the hall, I am unable to do this  with her son.  ABDOMEN: Maybe very mildly distended. She actually has positive node  tenderness. She does have a hernia in the lower part of the incision. I am  sure how well I can reduce her hernia, but it is certainly not tender. No  masses or guarding bilaterally.  EXTREMITIES: She has good strength in all four extremities.  NEUROLOGIC: Grossly intact.   White blood cell count 14,200.  I reviewed her CT scan with Dr. Hulan Saas which shows a  hernia in her  lower abdominal incision. So it looks like a partial bowel obstruction.   IMPRESSION:  1.  Partial bowel obstruction secondary to ventral hernia. The patient has      had these symptoms for months. She has been to the ER only on one other      occasion. Probably is better to admit her and then hernia repaired as an      elective basis. I  have talked about going ahead and having it repaired      tomorrow. Since I am on emergency call, if I get tied up and cannot do      it, we will just keep her here until we get it repaired. She understands      the indications  and potential  risks of hernia repair. The potential      risks include but not limited to bleeding, infection, nerve injury,      possible recurrence of hernia.  2.  Hypertension.  3.  The patient is destitute. Again, her husband is in a nursing home with      Alzheimer disease, she is unemployed, and has very little income.      Sandria Bales. Ezzard Standing, M.D.  Electronically Signed     DHN/MEDQ  D:  10/18/2005  T:  10/18/2005  Job:  [id number]   cc:   Colon Flattery, D.O.  He has moved to [place].  This information was given by  his former office.   Learta Codding, M.D. [facility]  [address]  [address]  [place]  [place] [address]   Gretta Cool, M.D.  Fax: [phone]

## 2011-02-22 NOTE — H&P (Signed)
NAME:  Stacey [name]                      ACCOUNT NO.:  [id number]   MEDICAL RECORD NO.:  [id number]                   PATIENT TYPE:  EMS   LOCATION:  MAJO                                 FACILITY:  [facility]   PHYSICIAN:  Sharlet Salina T. Hoxworth, M.D.          DATE OF BIRTH:  [birth year]   DATE OF ADMISSION:  10/09/2003  DATE OF DISCHARGE:                                HISTORY & PHYSICAL   CHIEF COMPLAINT:  Abdominal pain, nausea, and vomiting.   HISTORY OF PRESENT ILLNESS:  Stacey [name] is a 70 year old white female  who about 18 hours prior to this admission developed the onset of crampy  midabdominal pain.  This pain worsened and she presented to the [facility]  Emergency Room.  Since arriving at the hospital, she has had several  episodes of nausea and vomiting.  The patient states that over the last six  months or so she has had numerous episodes of abdominal pain and bloating  following meals that will then gradually improve.  No fever or chills.  No  GI symptoms.  Bowel movements have been normal.   PAST SURGICAL HISTORY:  Significant for hysterectomy and then TAH and BSO,  both through a low midline incision.   PAST MEDICAL HISTORY:  She is treated for hypertension.   MEDICATIONS:  1. Zestril one daily.  2. Lasix 40 mg daily.   ALLERGIES:  She is allergic to a cephalosporin antibiotic.  I am not sure  which one.   SOCIAL HISTORY:  She is married.  She does not smoke cigarettes or drink  alcohol.   FAMILY HISTORY:  Noncontributory.   REVIEW OF SYSTEMS:  GENERAL:  No fever, chills, or weight change.  RESPIRATORY:  No shortness of breath, cough, or wheezing.  CARDIAC:  No  palpitations, chest pain, or history of heart disease.  ENDOCRINE:  No  diabetes or thyroid problems.  HEMATOLOGIC:  No history of bleeding or  abnormal blood clots.   PHYSICAL EXAMINATION:  VITAL SIGNS:  The temperature is 97.3 degrees, pulse  77 and regular, respirations 19, and blood pressure  109/67.  GENERAL APPEARANCE:  She is a well-developed, well-nourished, white female  who appears uncomfortable.  SKIN:  Warm and dry without rash or infection.  HEENT:  No palpable mass or thyromegaly.  Sclerae nonicteric.  Nares and  oropharynx clear.  LUNGS:  Clear to auscultation without increased work of breathing.  CARDIAC:  Regular rate and rhythm without murmurs.  No JVD.  There is 1+  ankle edema.  Peripheral pulses intact.  ABDOMEN:  Mild distention.  Healed low midline incision.  There is quite a  bit of tenderness along the midline incision, but I cannot feel any mass  here.  The abdomen is mildly distended.  Bowel sounds are hypoactive.  There  is mild diffuse tenderness.  No palpable masses or hepatosplenomegaly.  EXTREMITIES:  No joint swelling or deformity.  NEUROLOGIC:  She is alert  and fully oriented.  Motor and sensory exams are  grossly normal.   LABORATORY DATA:  White count elevated at 13,900, hemoglobin 12.  Urinalysis  negative.  Electrolytes and LFTs all within normal limits.  Glucose 146.  Lipase 30.   CT scan of the abdomen and pelvis reviewed.  This shows an apparent loop of  small bowel incarcerated through her low midline incision with small bowel  obstruction and fairly marked thickening of the more proximal small bowel.   ASSESSMENT AND PLAN:  Small bowel obstruction secondary to incarcerated  incisional hernia.  This has been symptomatic for some months.  I have  recommended proceeding to the operating room emergently for repair.  She may  require small bowel resection.  She is being given preoperative antibiotics.                                                Lorne Skeens. Hoxworth, M.D.    Tory Emerald  D:  10/09/2003  T:  10/09/2003  Job:  [id number]

## 2011-09-24 ENCOUNTER — Emergency Department (HOSPITAL_COMMUNITY): Payer: Medicare Other

## 2011-09-24 ENCOUNTER — Encounter: Payer: Self-pay | Admitting: *Deleted

## 2011-09-24 ENCOUNTER — Inpatient Hospital Stay (HOSPITAL_COMMUNITY)
Admission: EM | Admit: 2011-09-24 | Discharge: 2011-09-29 | DRG: 390 | Disposition: A | Discharge status: Home / Self Care | Payer: Medicare Other | Attending: Internal Medicine | Admitting: Internal Medicine

## 2011-09-24 DIAGNOSIS — K219 Gastro-esophageal reflux disease without esophagitis: Secondary | ICD-10-CM | POA: Diagnosis present

## 2011-09-24 DIAGNOSIS — K56609 Unspecified intestinal obstruction, unspecified as to partial versus complete obstruction: Secondary | ICD-10-CM

## 2011-09-24 DIAGNOSIS — Z79899 Other long term (current) drug therapy: Secondary | ICD-10-CM

## 2011-09-24 DIAGNOSIS — K56600 Partial intestinal obstruction, unspecified as to cause: Secondary | ICD-10-CM

## 2011-09-24 DIAGNOSIS — E876 Hypokalemia: Secondary | ICD-10-CM | POA: Diagnosis present

## 2011-09-24 DIAGNOSIS — I10 Essential (primary) hypertension: Secondary | ICD-10-CM | POA: Diagnosis present

## 2011-09-24 HISTORY — DX: Unspecified intestinal obstruction, unspecified as to partial versus complete obstruction: K56.609

## 2011-09-24 HISTORY — DX: Unspecified osteoarthritis, unspecified site: M19.90

## 2011-09-24 LAB — URINE MICROSCOPIC-ADD ON

## 2011-09-24 LAB — BASIC METABOLIC PANEL
BUN: 11 mg/dL (ref 6–23)
Chloride: 100 mEq/L (ref 96–112)
Creatinine, Ser: 0.65 mg/dL (ref 0.50–1.10)
GFR calc Af Amer: >90 mL/min (ref >90)
GFR calc non Af Amer: 89 mL/min — ABNORMAL LOW (ref >90)

## 2011-09-24 LAB — DIFFERENTIAL
Basophils Absolute: 0 10*3/uL (ref 0.0–0.1)
Basophils Relative: 0 % (ref 0–1)
Eosinophils Absolute: 0 10*3/uL (ref 0.0–0.7)
Monocytes Absolute: 0.3 10*3/uL (ref 0.1–1.0)
Monocytes Relative: 3 % (ref 3–12)
Neutro Abs: 9.7 10*3/uL — ABNORMAL HIGH (ref 1.7–7.7)
Neutrophils Relative %: 84 % — ABNORMAL HIGH (ref 43–77)

## 2011-09-24 LAB — CBC
HCT: 35.4 % — ABNORMAL LOW (ref 36.0–46.0)
Hemoglobin: 11.5 g/dL — ABNORMAL LOW (ref 12.0–15.0)
MCH: 29.2 pg (ref 26.0–34.0)
MCHC: 32.5 g/dL (ref 30.0–36.0)
RDW: 13.9 % (ref 11.5–15.5)

## 2011-09-24 LAB — URINALYSIS, ROUTINE W REFLEX MICROSCOPIC
Bilirubin Urine: NEGATIVE
Ketones, ur: NEGATIVE mg/dL
Leukocytes, UA: NEGATIVE
Nitrite: NEGATIVE
Protein, ur: NEGATIVE mg/dL
Urobilinogen, UA: 0.2 mg/dL (ref 0.0–1.0)

## 2011-09-24 LAB — LACTIC ACID, PLASMA: Lactic Acid, Venous: 3.2 mmol/L — ABNORMAL HIGH (ref 0.5–2.2)

## 2011-09-24 LAB — OCCULT BLOOD, POC DEVICE: Fecal Occult Bld: NEGATIVE

## 2011-09-24 LAB — MAGNESIUM: Magnesium: 1.8 mg/dL (ref 1.5–2.5)

## 2011-09-24 MED ORDER — ACETAMINOPHEN 325 MG PO TABS
650.0000 mg | ORAL_TABLET | Freq: Four times a day (QID) | ORAL | Status: DC | PRN
  Administered 2011-09-28 (×2): 650 mg via ORAL
  Filled 2011-09-24: qty 1
  Filled 2011-09-24: qty 2

## 2011-09-24 MED ORDER — ACETAMINOPHEN 650 MG RE SUPP: 650.0000 mg | Freq: Four times a day (QID) | RECTAL | Status: DC | PRN

## 2011-09-24 MED ORDER — MORPHINE SULFATE 4 MG/ML IJ SOLN
4.0000 mg | Freq: Once | INTRAMUSCULAR | Status: AC
  Administered 2011-09-24: 4 mg via INTRAVENOUS
  Filled 2011-09-24: qty 1

## 2011-09-24 MED ORDER — HYDROMORPHONE HCL PF 1 MG/ML IJ SOLN
1.0000 mg | INTRAMUSCULAR | Status: DC | PRN
  Administered 2011-09-24: 1 mg via INTRAVENOUS
  Filled 2011-09-24: qty 1

## 2011-09-24 MED ORDER — ONDANSETRON HCL 4 MG/2ML IJ SOLN
4.0000 mg | Freq: Once | INTRAMUSCULAR | Status: AC
  Administered 2011-09-24: 4 mg via INTRAVENOUS
  Filled 2011-09-24: qty 2

## 2011-09-24 MED ORDER — POTASSIUM CHLORIDE IN NACL 20-0.9 MEQ/L-% IV SOLN
INTRAVENOUS | Status: DC
  Administered 2011-09-24 – 2011-09-28 (×9): via INTRAVENOUS
  Filled 2011-09-24 (×12): qty 1000

## 2011-09-24 MED ORDER — ONDANSETRON HCL 4 MG/2ML IJ SOLN: 4.0000 mg | Freq: Four times a day (QID) | INTRAMUSCULAR | Status: DC | PRN

## 2011-09-24 MED ORDER — ONDANSETRON HCL 4 MG/2ML IJ SOLN
4.0000 mg | Freq: Three times a day (TID) | INTRAMUSCULAR | Status: DC | PRN
  Administered 2011-09-24: 4 mg via INTRAVENOUS
  Filled 2011-09-24: qty 2

## 2011-09-24 MED ORDER — SODIUM CHLORIDE 0.9 % IV SOLN
INTRAVENOUS | Status: DC
  Administered 2011-09-24: 12:00:00 via INTRAVENOUS

## 2011-09-24 MED ORDER — IOHEXOL 300 MG/ML  SOLN
80.0000 mL | Freq: Once | INTRAMUSCULAR | Status: AC | PRN
  Administered 2011-09-24: 80 mL via INTRAVENOUS

## 2011-09-24 MED ORDER — SODIUM CHLORIDE 0.9 % IV SOLN: INTRAVENOUS | Status: DC

## 2011-09-24 MED ORDER — ENOXAPARIN SODIUM 40 MG/0.4ML ~~LOC~~ SOLN
40.0000 mg | SUBCUTANEOUS | Status: DC
  Administered 2011-09-24 – 2011-09-28 (×4): 40 mg via SUBCUTANEOUS
  Filled 2011-09-24 (×7): qty 0.4

## 2011-09-24 MED ORDER — ONDANSETRON HCL 4 MG PO TABS: 4.0000 mg | ORAL_TABLET | Freq: Four times a day (QID) | ORAL | Status: DC | PRN

## 2011-09-24 MED ORDER — HYDROMORPHONE HCL PF 1 MG/ML IJ SOLN
1.0000 mg | INTRAMUSCULAR | Status: DC | PRN
  Administered 2011-09-24 – 2011-09-26 (×7): 1 mg via INTRAVENOUS
  Filled 2011-09-24 (×7): qty 1

## 2011-09-24 NOTE — ED Notes (Signed)
Pt. Transferred to admit. Bed via stretcher with tech, Pt. Alert and oriented, NAD noted

## 2011-09-24 NOTE — ED Provider Notes (Signed)
2:05 PM  I performed a history and physical examination of Stacey [name] and discussed her management with Fayrene Helper PA.  I agree with the history, physical, assessment, and plan of care, with the following exceptions: None  I have evaluated the patient face-to-face. The patient presents with abdominal pain and nausea but no vomiting with a history of small bowel obstructions stating her symptoms feel similar to small bowel obstruction. She reports pain at the epigastrium and the right lower cautery and. On examination, the patient is awake and alert, oriented appropriately, in no apparent distress drinking contrast for her CT scan. Examination of her abdomen reveals hyperactive bowel sounds but normal bowel sounds without any tinkling or squealing sounds to suggest bowel obstruction. She is tender to palpation at the right lower quadrant but her abdomen is soft with no guarding or rebound tenderness. Her lungs are clear to auscultation in all fields and she has normal heart sounds.  I was present for the following procedures: None Time Spent in Critical Care of the patient: None Time spent in discussions with the patient and family: 5 minutes  Stacey [name]    Stacey Bonier, MD 09/24/11 1407

## 2011-09-24 NOTE — ED Notes (Signed)
Family at bedside. 

## 2011-09-24 NOTE — ED Notes (Signed)
`  [id number]

## 2011-09-24 NOTE — ED Provider Notes (Signed)
History    this is a 70 year old female with a history of small bowel obstruction is presenting to the ED with chief complaints of abdominal pain. Patient states to woke up this morning with an acute onset of abdominal pain. Pain is described pain as throbbing, and gnawing involving affecting her right lower quadrant radiating across the abdomen. Pain is unimproved with movement. She denies fever, headache, nausea, vomiting, diarrhea, chest pain, shortness of breath, back pain, dysuria, vaginal discharge.  She did had a small bowel movement this a.m. she denies seeing blood or black stool.  Patient states her pain feels similar to a prior small bowel obstruction. She rates her pain is 10 out of 10. She denies any recent strenuous exercise or recent trauma. She denies recreational drug use or alcohol use. She did have prior abdominal surgery.  CSN: [id number] Arrival date & time: 09/24/2011 11:42 AM   First MD Initiated Contact with Patient 09/24/11 1205      Chief Complaint  Patient presents with  . Abdominal Pain  . Nausea    (Consider location/radiation/quality/duration/timing/severity/associated sxs/prior treatment) HPI  Past Medical History  Diagnosis Date  . Bowel obstruction   . Arthritis     Past Surgical History  Procedure Date  . Partial hysterectomy   . Oophorectomy   . Hernia repair     No family history on file.  History  Substance Use Topics  . Smoking status: Never Smoker   . Smokeless tobacco: Not on file  . Alcohol Use: No    OB History    Grav Para Term Preterm Abortions TAB SAB Ect Mult Living                  Review of Systems  Allergies  Review of patient's allergies indicates no known allergies.  Home Medications   Current Outpatient Rx  Name Route Sig Dispense Refill  . ACETAMINOPHEN 500 MG PO TABS Oral Take 1,000 mg by mouth every 6 (six) hours as needed. For pain      . ADULT MULTIVITAMIN W/MINERALS CH Oral Take 1 tablet by mouth daily.         BP 155/91  Pulse 96  Temp(Src) 96.9 F (36.1 C) (Oral)  Resp 18  SpO2 98%  Physical Exam  Nursing note and vitals reviewed. Constitutional: She appears well-developed and well-nourished. No distress.       Awake, alert, nontoxic appearance  HENT:  Head: Normocephalic and atraumatic.  Eyes: Right eye exhibits no discharge. Left eye exhibits no discharge. No scleral icterus.  Neck: Neck supple.  Cardiovascular: Normal rate and regular rhythm.   Pulmonary/Chest: Effort normal. No respiratory distress. She exhibits no tenderness.  Abdominal: Soft. There is tenderness in the right lower quadrant, periumbilical area and suprapubic area. There is no rigidity, no rebound, no guarding, no tenderness at McBurney's point and negative Murphy's sign.    Musculoskeletal: She exhibits no tenderness.       Baseline ROM, no obvious new focal weakness  Neurological:       Mental status and motor strength appears baseline for patient and situation  Skin: No rash noted.  Psychiatric: She has a normal mood and affect.    ED Course  Procedures (including critical care time)  Labs Reviewed - No data to display No results found.   No diagnosis found.    MDM  Pt has prior SBO presenting with acute abd pain.  Pain felt similar to prior SBO.  Pt has stable VS,  and no obvious evidence of acute abdomen.  Due to her history, will obtain abdominal CT for further evaluation.  Discussed with my attending, who agrees with plan, and will see pt.     3:55 PM abd ct shows partial SBO with dilatation.  WBC 11.5, Neu 84 with lactic acid 3.2.  Will call for admission.    4:07 PM Consult with [facility], who agrees to admit pt to Team 3, Medsurg bed to Dr. Darletta Moll.  Pt currently in NAD, VSS.    Fayrene Helper, PA 09/24/11 [phone]

## 2011-09-24 NOTE — H&P (Signed)
JENINE KRISHER MRN: [id number] DOB/AGE: 04/04/1941 70 y.o. Primary Care Physician:No primary provider on file. Admit date: 09/24/2011  PRIMARY CARE PHYSICIAN:  [facility] in   [place], [place].   Chief Complaint: Nausea vomiting   HPI: 70 year old female with a history of small bowel obstruction is presenting to the ED with chief complaints of abdominal pain. Patient states to woke up this morning with an acute onset of abdominal pain. Pain is described pain as throbbing, and gnawing involving affecting her right lower quadrant radiating across the abdomen. Pain is unimproved with movement. She has not had much nausea or vomiting.The patient has a history of   small bowel resection and 2005 where she had an incarcerated ventral   hernia which required a small-bowel extortion which required exploratory   laparotomy to remove adhesions. In a similar episode in July of 2011 that was treated conservatively. She denies fever, headache, nausea, vomiting, diarrhea, chest pain, shortness of breath, back pain, dysuria, vaginal discharge. She did had a small bowel movement this a.m. she denies seeing blood or black stool. Patient states her pain feels similar to a prior small bowel obstruction. She rates her pain is 10 out of 10. She denies any recent strenuous exercise or recent trauma. She denies recreational drug use or alcohol use. She did have prior abdominal surgery.   Past Medical History  Diagnosis Date  . Bowel obstruction   . Arthritis    1. High-grade small bowel obstruction resolved.   2. Hypertension.   3. History of small bowel obstructions in the past status post lysis       of adhesions and exploratory laparotomy.   4. Status post repair of hernia.   5. Gastroesophageal reflux disease.   6. Status post hysterectomy.   7. Status post small-bowel resection in 2005.   8. Status post exploratory laparotomy 2007 for incarcerated ventral       hernia and  a small bowel obstruction.   Past Surgical History  Procedure Date  . Partial hysterectomy   . Oophorectomy   . Hernia repair     Prior to Admission medications   Medication Sig Start Date End Date Taking? Authorizing Provider  acetaminophen (TYLENOL) 500 MG tablet Take 1,000 mg by mouth every 6 (six) hours as needed. For pain     Yes Historical Provider, MD  Multiple Vitamin (MULITIVITAMIN WITH MINERALS) TABS Take 1 tablet by mouth daily.     Yes Historical Provider, MD    Allergies: No Known Allergies  No family history on file.  Social History:  reports that she has never smoked. She does not have any smokeless tobacco history on file. She reports that she does not drink alcohol or use illicit drugs. Family history reviewed and found to be noncontributory        ROS: A 14 point review of systems was done as documented in history of present illness PHYSICAL EXAM: Blood pressure 124/78, pulse 91, temperature 97.7 F (36.5 C), temperature source Oral, resp. rate 17, SpO2 98.00%. Constitutional: She appears well-developed and well-nourished. No distress.  Awake, alert, nontoxic appearance  HENT:  Head: Normocephalic and atraumatic.  Eyes: Right eye exhibits no discharge. Left eye exhibits no discharge. No scleral icterus.  Neck: Neck supple.  Cardiovascular: Normal rate and regular rhythm.  Pulmonary/Chest: Effort normal. No respiratory distress. She exhibits no tenderness.  Abdominal: Soft. There is tenderness in the right lower quadrant, periumbilical area and suprapubic area. There is no rigidity,  no rebound, no guarding, no tenderness at McBurney's point and negative Murphy's sign. Musculoskeletal: She exhibits no tenderness.  Baseline ROM, no obvious new focal weakness  Neurological:  Mental status and motor strength appears baseline for patient and situation  Skin: No rash noted.  Psychiatric: She has a normal mood and affect.    No results found for this or any  previous visit (from the past 240 hour(s)).   Results for orders placed during the hospital encounter of 09/24/11 (from the past 48 hour(s))  CBC     Status: Abnormal   Collection Time   09/24/11 12:30 PM      Component Value Range Comment   WBC 11.5 (*) 4.0 - 10.5 (K/uL)    RBC 3.94  3.87 - 5.11 (MIL/uL)    Hemoglobin 11.5 (*) 12.0 - 15.0 (g/dL)    HCT 16.1 (*) 09.6 - 46.0 (%)    MCV 89.8  78.0 - 100.0 (fL)    MCH 29.2  26.0 - 34.0 (pg)    MCHC 32.5  30.0 - 36.0 (g/dL)    RDW 04.5  40.9 - 81.1 (%)    Platelets 201  150 - 400 (K/uL)   DIFFERENTIAL     Status: Abnormal   Collection Time   09/24/11 12:30 PM      Component Value Range Comment   Neutrophils Relative 84 (*) 43 - 77 (%)    Neutro Abs 9.7 (*) 1.7 - 7.7 (K/uL)    Lymphocytes Relative 13  12 - 46 (%)    Lymphs Abs 1.4  0.7 - 4.0 (K/uL)    Monocytes Relative 3  3 - 12 (%)    Monocytes Absolute 0.3  0.1 - 1.0 (K/uL)    Eosinophils Relative 0  0 - 5 (%)    Eosinophils Absolute 0.0  0.0 - 0.7 (K/uL)    Basophils Relative 0  0 - 1 (%)    Basophils Absolute 0.0  0.0 - 0.1 (K/uL)   BASIC METABOLIC PANEL     Status: Abnormal   Collection Time   09/24/11 12:30 PM      Component Value Range Comment   Sodium 137  135 - 145 (mEq/L)    Potassium 3.4 (*) 3.5 - 5.1 (mEq/L)    Chloride 100  96 - 112 (mEq/L)    CO2 20  19 - 32 (mEq/L)    Glucose, Bld 115 (*) 70 - 99 (mg/dL)    BUN 11  6 - 23 (mg/dL)    Creatinine, Ser 9.14  0.50 - 1.10 (mg/dL)    Calcium 9.2  8.4 - 10.5 (mg/dL)    GFR calc non Af Amer 89 (*) >90 (mL/min)    GFR calc Af Amer >90  >90 (mL/min)   LACTIC ACID, PLASMA     Status: Abnormal   Collection Time   09/24/11 12:30 PM      Component Value Range Comment   Lactic Acid, Venous 3.2 (*) 0.5 - 2.2 (mmol/L)   OCCULT BLOOD, POC DEVICE     Status: Normal   Collection Time   09/24/11  1:13 PM      Component Value Range Comment   Fecal Occult Bld NEGATIVE     URINALYSIS, ROUTINE W REFLEX MICROSCOPIC     Status:  Abnormal   Collection Time   09/24/11  2:11 PM      Component Value Range Comment   Color, Urine YELLOW  YELLOW     APPearance CLEAR  CLEAR  Specific Gravity, Urine 1.022  1.005 - 1.030     pH 5.5  5.0 - 8.0     Glucose, UA NEGATIVE  NEGATIVE (mg/dL)    Hgb urine dipstick TRACE (*) NEGATIVE     Bilirubin Urine NEGATIVE  NEGATIVE     Ketones, ur NEGATIVE  NEGATIVE (mg/dL)    Protein, ur NEGATIVE  NEGATIVE (mg/dL)    Urobilinogen, UA 0.2  0.0 - 1.0 (mg/dL)    Nitrite NEGATIVE  NEGATIVE     Leukocytes, UA NEGATIVE  NEGATIVE    URINE MICROSCOPIC-ADD ON     Status: Normal   Collection Time   09/24/11  2:11 PM      Component Value Range Comment   Squamous Epithelial / LPF RARE  RARE     RBC / HPF 0-2  <3 (RBC/hpf)    Bacteria, UA RARE  RARE      Ct Abdomen Pelvis W Contrast  09/24/2011  *RADIOLOGY REPORT*     IMPRESSION: Evidence of partial small bowel obstruction with dilatation primarily of the ileum which can be followed to the level of a small bowel surgical anastomosis close to the terminal ileum.  Original Report Authenticated By: Reola Calkins, M.D.   Dg Abd Acute W/chest  09/24/2011  *RADIOLOGY REPORT*  Clinical Data: Abdominal pain.  Nausea.  ACUTE ABDOMEN SERIES (ABDOMEN 2 VIEW & CHEST 1 VIEW)  Comparison: Radiographs dated 04/18/2010 and 10/18/2005  Findings: Heart and lungs are within normal limits.  No free air in the abdomen.  Bowel gas pattern is normal.  No acute osseous abnormality.  IMPRESSION: Benign-appearing abdomen and chest.  Original Report Authenticated By: Gwynn Burly, M.D.    Impression: #1 Small bowel obstruction CC S. has been consulted I have offered an NG tube, however the patient is hesitant to have him placed at this time.  #2 Gastroesophageal reflux disease and new PPI   #3 Hypertension controlled, will use   hydralazine if needed  #4 Hypokalemia replete with IV fluids            Ritu Gagliardo 09/24/2011, 6:46 PM

## 2011-09-24 NOTE — ED Notes (Signed)
Pt drinking CT contrast 

## 2011-09-24 NOTE — ED Notes (Signed)
Pt ambulated to and from restroom. 

## 2011-09-24 NOTE — Consult Note (Signed)
Reason for Consult:SBO Referring Physician: [name] is an 70 y.o. female.  HPI: The patient presents with a 1 day history of diffuse abdominal pain.  Crampy in nature.  7/10 IN NATURE. BM today.  No vomiting.  Some nausea.  Started at 5 am today. Asked to see patient for abdominal pain at the request of DR ABROL.  Past Medical History  Diagnosis Date  . Bowel obstruction   . Arthritis     Past Surgical History  Procedure Date  . Partial hysterectomy   . Oophorectomy   . Hernia repair     No family history on file.  Social History:  reports that she has never smoked. She does not have any smokeless tobacco history on file. She reports that she does not drink alcohol or use illicit drugs.  Allergies: No Known Allergies  Medications: I have reviewed the patient's current medications. No current facility-administered medications on file prior to encounter.   No current outpatient prescriptions on file prior to encounter.   Scheduled Meds:   . sodium chloride   Intravenous STAT  .  morphine injection  4 mg Intravenous Once  .  morphine injection  4 mg Intravenous Once  . ondansetron  4 mg Intravenous Once   Continuous Infusions:   . sodium chloride 125 mL/hr at 09/24/11 1228   PRN Meds:.HYDROmorphone (DILAUDID) injection, iohexol, ondansetron (ZOFRAN) IV Results for orders placed during the hospital encounter of 09/24/11 (from the past 48 hour(s))  CBC     Status: Abnormal   Collection Time   09/24/11 12:30 PM      Component Value Range Comment   WBC 11.5 (*) 4.0 - 10.5 (K/uL)    RBC 3.94  3.87 - 5.11 (MIL/uL)    Hemoglobin 11.5 (*) 12.0 - 15.0 (g/dL)    HCT 45.4 (*) 09.8 - 46.0 (%)    MCV 89.8  78.0 - 100.0 (fL)    MCH 29.2  26.0 - 34.0 (pg)    MCHC 32.5  30.0 - 36.0 (g/dL)    RDW 11.9  14.7 - 82.9 (%)    Platelets 201  150 - 400 (K/uL)   DIFFERENTIAL     Status: Abnormal   Collection Time   09/24/11 12:30 PM      Component Value Range Comment   Neutrophils Relative 84 (*) 43 - 77 (%)    Neutro Abs 9.7 (*) 1.7 - 7.7 (K/uL)    Lymphocytes Relative 13  12 - 46 (%)    Lymphs Abs 1.4  0.7 - 4.0 (K/uL)    Monocytes Relative 3  3 - 12 (%)    Monocytes Absolute 0.3  0.1 - 1.0 (K/uL)    Eosinophils Relative 0  0 - 5 (%)    Eosinophils Absolute 0.0  0.0 - 0.7 (K/uL)    Basophils Relative 0  0 - 1 (%)    Basophils Absolute 0.0  0.0 - 0.1 (K/uL)   BASIC METABOLIC PANEL     Status: Abnormal   Collection Time   09/24/11 12:30 PM      Component Value Range Comment   Sodium 137  135 - 145 (mEq/L)    Potassium 3.4 (*) 3.5 - 5.1 (mEq/L)    Chloride 100  96 - 112 (mEq/L)    CO2 20  19 - 32 (mEq/L)    Glucose, Bld 115 (*) 70 - 99 (mg/dL)    BUN 11  6 - 23 (mg/dL)  Creatinine, Ser 0.65  0.50 - 1.10 (mg/dL)    Calcium 9.2  8.4 - 10.5 (mg/dL)    GFR calc non Af Amer 89 (*) >90 (mL/min)    GFR calc Af Amer >90  >90 (mL/min)   LACTIC ACID, PLASMA     Status: Abnormal   Collection Time   09/24/11 12:30 PM      Component Value Range Comment   Lactic Acid, Venous 3.2 (*) 0.5 - 2.2 (mmol/L)   OCCULT BLOOD, POC DEVICE     Status: Normal   Collection Time   09/24/11  1:13 PM      Component Value Range Comment   Fecal Occult Bld NEGATIVE     URINALYSIS, ROUTINE W REFLEX MICROSCOPIC     Status: Abnormal   Collection Time   09/24/11  2:11 PM      Component Value Range Comment   Color, Urine YELLOW  YELLOW     APPearance CLEAR  CLEAR     Specific Gravity, Urine 1.022  1.005 - 1.030     pH 5.5  5.0 - 8.0     Glucose, UA NEGATIVE  NEGATIVE (mg/dL)    Hgb urine dipstick TRACE (*) NEGATIVE     Bilirubin Urine NEGATIVE  NEGATIVE     Ketones, ur NEGATIVE  NEGATIVE (mg/dL)    Protein, ur NEGATIVE  NEGATIVE (mg/dL)    Urobilinogen, UA 0.2  0.0 - 1.0 (mg/dL)    Nitrite NEGATIVE  NEGATIVE     Leukocytes, UA NEGATIVE  NEGATIVE    URINE MICROSCOPIC-ADD ON     Status: Normal   Collection Time   09/24/11  2:11 PM      Component Value Range Comment    Squamous Epithelial / LPF RARE  RARE     RBC / HPF 0-2  <3 (RBC/hpf)    Bacteria, UA RARE  RARE      Ct Abdomen Pelvis W Contrast  09/24/2011  *RADIOLOGY REPORT*  Clinical Data: Severe abdominal pain and history of small bowel obstruction.  CT ABDOMEN AND PELVIS WITH CONTRAST  Technique:  Multidetector CT imaging of the abdomen and pelvis was performed following the standard protocol during bolus administration of intravenous contrast.  Contrast: 80mL OMNIPAQUE IOHEXOL 300 MG/ML IV SOLN  Comparison: 04/15/2010  Findings: There is evidence of a small bowel obstruction with moderate dilatation of loops of ileum in the pelvis measuring up to roughly 3.3 cm in maximal caliber. Dilated loops contain fecalized contents.  Small bowel dilatation can be followed up to a transition point at the level of the prior partial small bowel resection and visible anastomosis in the posterior midline pelvis at the level of the upper sacrum appreciated on axial images #56- 62. This anastomosis is fairly close to the terminal ileum.  There is no evidence of bowel perforation or abscess.  Mild mesenteric edema noted.  Proximal small bowel loops are fairly normal in caliber and contains ingested oral contrast.  The colon is of normal caliber and contains scattered fecal material and air.  The liver, gallbladder, pancreas, spleen, adrenal glands and kidneys are unremarkable.  Small hiatal hernia present.  No other hernias identified.  The bladder is unremarkable.  Bony structures are unremarkable.  IMPRESSION: Evidence of partial small bowel obstruction with dilatation primarily of the ileum which can be followed to the level of a small bowel surgical anastomosis close to the terminal ileum.  Original Report Authenticated By: Reola Calkins, M.D.   Dg Abd Acute  W/chest  09/24/2011  *RADIOLOGY REPORT*  Clinical Data: Abdominal pain.  Nausea.  ACUTE ABDOMEN SERIES (ABDOMEN 2 VIEW & CHEST 1 VIEW)  Comparison: Radiographs dated  04/18/2010 and 10/18/2005  Findings: Heart and lungs are within normal limits.  No free air in the abdomen.  Bowel gas pattern is normal.  No acute osseous abnormality.  IMPRESSION: Benign-appearing abdomen and chest.  Original Report Authenticated By: Gwynn Burly, M.D.    Review of Systems  Constitutional: Negative.   HENT: Negative.   Eyes: Negative.   Respiratory: Negative.   Cardiovascular: Negative.   Gastrointestinal: Positive for nausea and abdominal pain. Negative for vomiting, diarrhea and constipation.  Genitourinary: Negative.   Musculoskeletal: Negative.   Skin: Negative.   Neurological: Negative.   Endo/Heme/Allergies: Negative.   Psychiatric/Behavioral: Negative.    Blood pressure 124/78, pulse 79, temperature 97.7 F (36.5 C), temperature source Oral, resp. rate 17, SpO2 98.00%. Physical Exam  Constitutional: She is oriented to person, place, and time. She appears well-developed and well-nourished.  HENT:  Head: Normocephalic and atraumatic.  Eyes: EOM are normal. Pupils are equal, round, and reactive to light.  Neck: Normal range of motion. Neck supple.  Cardiovascular: Normal rate and regular rhythm.   No murmur heard. Respiratory: Effort normal and breath sounds normal. No respiratory distress. She has no wheezes.  GI: Soft. Bowel sounds are normal. She exhibits no distension and no mass. There is tenderness. There is no rebound and no guarding.  Musculoskeletal: Normal range of motion.  Neurological: She is alert and oriented to person, place, and time.  Skin: Skin is warm and dry.  Psychiatric: She has a normal mood and affect. Her behavior is normal. Judgment and thought content normal.    Assessment/Plan: pSBO  Continue NPO. NG if she vomits. Plan abd X ray in AM.  Otilio Groleau A. 09/24/2011, 6:59 PM

## 2011-09-24 NOTE — ED Notes (Signed)
Patient transported to CT 

## 2011-09-24 NOTE — ED Notes (Signed)
Patient has hx of bowel obstructions.  She developed onset of abd pain and nausea today.  Last bm reported to be today  But small.

## 2011-09-24 NOTE — ED Notes (Signed)
MD at bedside. 

## 2011-09-25 ENCOUNTER — Encounter (HOSPITAL_COMMUNITY): Payer: Self-pay | Admitting: *Deleted

## 2011-09-25 ENCOUNTER — Inpatient Hospital Stay (HOSPITAL_COMMUNITY): Payer: Medicare Other

## 2011-09-25 LAB — COMPREHENSIVE METABOLIC PANEL
BUN: 12 mg/dL (ref 6–23)
CO2: 29 mEq/L (ref 19–32)
Calcium: 8.5 mg/dL (ref 8.4–10.5)
Chloride: 104 mEq/L (ref 96–112)
Creatinine, Ser: 0.75 mg/dL (ref 0.50–1.10)
GFR calc Af Amer: >90 mL/min (ref >90)
GFR calc non Af Amer: 84 mL/min — ABNORMAL LOW (ref >90)
Total Bilirubin: 0.2 mg/dL — ABNORMAL LOW (ref 0.3–1.2)

## 2011-09-25 LAB — URINE CULTURE
Colony Count: NO GROWTH
Culture  Setup Time: 201212182258
Culture: NO GROWTH

## 2011-09-25 LAB — CBC
Hemoglobin: 10.9 g/dL — ABNORMAL LOW (ref 12.0–15.0)
Platelets: 211 10*3/uL (ref 150–400)
RBC: 3.83 MIL/uL — ABNORMAL LOW (ref 3.87–5.11)
WBC: 11.5 10*3/uL — ABNORMAL HIGH (ref 4.0–10.5)

## 2011-09-25 LAB — TSH: TSH: 1.126 u[IU]/mL (ref 0.350–4.500)

## 2011-09-25 MED ORDER — WHITE PETROLATUM GEL
Status: AC
  Administered 2011-09-25: 21:00:00
  Filled 2011-09-25: qty 5

## 2011-09-25 NOTE — Progress Notes (Signed)
Patient  Informed of plan for a nasogastric tube placement,patient states" the Doctor said if I keep on vomiting,then they will put the tube,I don't think I need it now." Will check again. Tamara Monteith [employer]

## 2011-09-25 NOTE — Progress Notes (Signed)
Subjective: Pt feels ok. Some better. No flatus ot BM, but no further N/V. She would prefer to keep NG out, I explained benefits of bowel decompression/rest. She denies pain  Objective: Vital signs in last 24 hours: Temp:  [96.9 F (36.1 C)-99.4 F (37.4 C)] 99.4 F (37.4 C) (12/19 0427) Pulse Rate:  [79-96] 80  (12/19 0427) Resp:  [17-20] 18  (12/19 0427) BP: (93-155)/(63-91) 93/63 mmHg (12/19 0427) SpO2:  [92 %-99 %] 92 % (12/19 0427) Weight:  [86.592 kg (190 lb 14.4 oz)] 190 lb 14.4 oz (86.592 kg) (12/18 2110) Last BM Date: 09/24/11  Intake/Output this shift:    Physical Exam: BP 93/63  Pulse 80  Temp(Src) 99.4 F (37.4 C) (Oral)  Resp 18  Ht 5\' 3"  (1.6 m)  Wt 86.592 kg (190 lb 14.4 oz)  BMI 33.82 kg/m2  SpO2 92% Abdomen: soft, ND, active BS, NT, no masses  Labs: CBC  Basename 09/25/11 0550 09/24/11 1230  WBC 11.5* 11.5*  HGB 10.9* 11.5*  HCT 35.1* 35.4*  PLT 211 201   BMET  Basename 09/24/11 1230  NA 137  K 3.4*  CL 100  CO2 20  GLUCOSE 115*  BUN 11  CREATININE 0.65  CALCIUM 9.2   LFT No results found for this basename: PROT,ALBUMIN,AST,ALT,ALKPHOS,BILITOT,BILIDIR,IBILI,LIPASE in the last 72 hours PT/INR No results found for this basename: LABPROT:2,INR:2 in the last 72 hours ABG No results found for this basename: PHART:2,PCO2:2,PO2:2,HCO3:2 in the last 72 hours  Studies/Results: Ct Abdomen Pelvis W Contrast  09/24/2011  *RADIOLOGY REPORT*  Clinical Data: Severe abdominal pain and history of small bowel obstruction.  CT ABDOMEN AND PELVIS WITH CONTRAST  Technique:  Multidetector CT imaging of the abdomen and pelvis was performed following the standard protocol during bolus administration of intravenous contrast.  Contrast: 80mL OMNIPAQUE IOHEXOL 300 MG/ML IV SOLN  Comparison: 04/15/2010  Findings: There is evidence of a small bowel obstruction with moderate dilatation of loops of ileum in the pelvis measuring up to roughly 3.3 cm in maximal  caliber. Dilated loops contain fecalized contents.  Small bowel dilatation can be followed up to a transition point at the level of the prior partial small bowel resection and visible anastomosis in the posterior midline pelvis at the level of the upper sacrum appreciated on axial images #56- 62. This anastomosis is fairly close to the terminal ileum.  There is no evidence of bowel perforation or abscess.  Mild mesenteric edema noted.  Proximal small bowel loops are fairly normal in caliber and contains ingested oral contrast.  The colon is of normal caliber and contains scattered fecal material and air.  The liver, gallbladder, pancreas, spleen, adrenal glands and kidneys are unremarkable.  Small hiatal hernia present.  No other hernias identified.  The bladder is unremarkable.  Bony structures are unremarkable.  IMPRESSION: Evidence of partial small bowel obstruction with dilatation primarily of the ileum which can be followed to the level of a small bowel surgical anastomosis close to the terminal ileum.  Original Report Authenticated By: Reola Calkins, M.D.   Dg Abd Acute W/chest  09/24/2011  *RADIOLOGY REPORT*  Clinical Data: Abdominal pain.  Nausea.  ACUTE ABDOMEN SERIES (ABDOMEN 2 VIEW & CHEST 1 VIEW)  Comparison: Radiographs dated 04/18/2010 and 10/18/2005  Findings: Heart and lungs are within normal limits.  No free air in the abdomen.  Bowel gas pattern is normal.  No acute osseous abnormality.  IMPRESSION: Benign-appearing abdomen and chest.  Original Report Authenticated By: Gwynn Burly,  M.D.    Assessment: Principal Problem:  *Gastroesophageal reflux disease Active Problems:  Small bowel obstruction  Hypertension  Hypokalemia  Plan: Await x-rays this am, not done yet. NG if x-rays worse or recurrent nausea. Encouraged OOB/walking  LOS: 1 day    Stacey [name] 09/25/2011

## 2011-09-25 NOTE — Progress Notes (Signed)
Patient still refusing to have nasogastric tube be placed,no complaints of nausea/vomiting. Stacey [name] 09/25/2011

## 2011-09-25 NOTE — Progress Notes (Signed)
09/25/2011 Stacey [name] [facility] Case Management Note [phone]  Utilization review completed.  

## 2011-09-25 NOTE — Progress Notes (Signed)
Subjective: No vomiting overnight, still has abdominal discomfort, no passage of flatus or stool.  Objective: Vital signs in last 24 hours: Temp:  [97.1 F (36.2 C)-99.4 F (37.4 C)] 97.7 F (36.5 C) (12/19 1000) Pulse Rate:  [79-92] 86  (12/19 1000) Resp:  [17-20] 20  (12/19 1000) BP: (93-137)/(63-78) 120/72 mmHg (12/19 1000) SpO2:  [92 %-99 %] 94 % (12/19 1000) Weight:  [86.592 kg (190 lb 14.4 oz)] 190 lb 14.4 oz (86.592 kg) (12/18 2110) Weight change:  Last BM Date: 09/24/11  Intake/Output from previous day: 12/18 0701 - 12/19 0700 In: 850 [I.V.:850] Out: 226 [Urine:225; Stool:1] Total I/O In: 0  Out: 1 [Urine:1]   Physical Exam: General: Comfortable, alert, communicative, fully oriented, not short of breath at rest.  HEENT:  Mild clinical pallor, no jaundice, no conjunctival injection or discharge. Hydration is fair. NECK:  Supple, JVP not seen, no carotid bruits, no palpable lymphadenopathy, no palpable goiter. CHEST:  Clinically clear to auscultation, no wheezes, no crackles. HEART:  Sounds 1 and 2 heard, normal, regular, no murmurs. ABDOMEN:  Full, soft, no scars, non-tender, no palpable organomegaly, no palpable masses, normal bowel sounds. GENITALIA:  Not examined. LOWER EXTREMITIES:  No pitting edema, palpable peripheral pulses. MUSCULOSKELETAL SYSTEM:  Generalized osteoarthritic changes, otherwise, normal. CENTRAL NERVOUS SYSTEM:  No focal neurologic deficit on gross examination.  Lab Results:  Basename 09/25/11 0550 09/24/11 1230  WBC 11.5* 11.5*  HGB 10.9* 11.5*  HCT 35.1* 35.4*  PLT 211 201    Basename 09/25/11 0550 09/24/11 1230  NA 140 137  K 3.9 3.4*  CL 104 100  CO2 29 20  GLUCOSE 123* 115*  BUN 12 11  CREATININE 0.75 0.65  CALCIUM 8.5 9.2   No results found for this or any previous visit (from the past 240 hour(s)).   Studies/Results: Ct Abdomen Pelvis W Contrast  09/24/2011  *RADIOLOGY REPORT*  Clinical Data: Severe abdominal pain and  history of small bowel obstruction.  CT ABDOMEN AND PELVIS WITH CONTRAST  Technique:  Multidetector CT imaging of the abdomen and pelvis was performed following the standard protocol during bolus administration of intravenous contrast.  Contrast: 80mL OMNIPAQUE IOHEXOL 300 MG/ML IV SOLN  Comparison: 04/15/2010  Findings: There is evidence of a small bowel obstruction with moderate dilatation of loops of ileum in the pelvis measuring up to roughly 3.3 cm in maximal caliber. Dilated loops contain fecalized contents.  Small bowel dilatation can be followed up to a transition point at the level of the prior partial small bowel resection and visible anastomosis in the posterior midline pelvis at the level of the upper sacrum appreciated on axial images #56- 62. This anastomosis is fairly close to the terminal ileum.  There is no evidence of bowel perforation or abscess.  Mild mesenteric edema noted.  Proximal small bowel loops are fairly normal in caliber and contains ingested oral contrast.  The colon is of normal caliber and contains scattered fecal material and air.  The liver, gallbladder, pancreas, spleen, adrenal glands and kidneys are unremarkable.  Small hiatal hernia present.  No other hernias identified.  The bladder is unremarkable.  Bony structures are unremarkable.  IMPRESSION: Evidence of partial small bowel obstruction with dilatation primarily of the ileum which can be followed to the level of a small bowel surgical anastomosis close to the terminal ileum.  Original Report Authenticated By: Reola Calkins, M.D.   Acute Abdominal Series  09/25/2011  *RADIOLOGY REPORT*  Clinical Data: Mid to lower right  sided abdominal pain, some nausea and vomiting  ACUTE ABDOMEN SERIES (ABDOMEN 2 VIEW & CHEST 1 VIEW)  Comparison: CT abdomen pelvis of 09/24/2011 and chest with acute abdomen of 09/24/2011  Findings: There is mild bibasilar linear atelectasis present.  No focal infiltrate or effusion is seen.  The  heart is mildly enlarged and stable.  There are dilated small bowel loops with differential air-fluid levels consistent with partial small bowel obstruction.  No free air is seen.  No distention of the colon is noted.  Contrast is noted in the urinary bladder from recent CT of the abdomen and pelvis.  No bony abnormality is seen other than diffuse osteopenia.  IMPRESSION:  1.  Partial small bowel obstruction.  No free air. 2.  Mild bibasilar atelectasis.  Original Report Authenticated By: Juline Patch, M.D.   Dg Abd Acute W/chest  09/24/2011  *RADIOLOGY REPORT*  Clinical Data: Abdominal pain.  Nausea.  ACUTE ABDOMEN SERIES (ABDOMEN 2 VIEW & CHEST 1 VIEW)  Comparison: Radiographs dated 04/18/2010 and 10/18/2005  Findings: Heart and lungs are within normal limits.  No free air in the abdomen.  Bowel gas pattern is normal.  No acute osseous abnormality.  IMPRESSION: Benign-appearing abdomen and chest.  Original Report Authenticated By: Gwynn Burly, M.D.    Medications: Scheduled Meds:   . enoxaparin  40 mg Subcutaneous Q24H  .  morphine injection  4 mg Intravenous Once  . DISCONTD: sodium chloride   Intravenous STAT   Continuous Infusions:   . 0.9 % NaCl with KCl 20 mEq / L 100 mL/hr at 09/25/11 0858  . DISCONTD: sodium chloride 125 mL/hr at 09/24/11 1228   PRN Meds:.acetaminophen, acetaminophen, HYDROmorphone, iohexol, ondansetron (ZOFRAN) IV, ondansetron, DISCONTD:  HYDROmorphone (DILAUDID) injection, DISCONTD: ondansetron (ZOFRAN) IV  Assessment/Plan:  Principal Problem:  *Gastroesophageal reflux disease: Not problematic. Active Problems:  1. Small bowel obstruction: Stable. AXR of 09/25/11, is unchanged. Continue bowel rest, iv fluids. Otherwise, managed per surgical recommendations.  2. Hypertension: Controlled.  3. Hypokalemia: Repleted as indicated.  Comment: Abdominal X-Ray in AM 09/26/11.  LOS: 1 day   Xavius Spadafore,CHRISTOPHER 09/25/2011, 1:23 PM

## 2011-09-26 ENCOUNTER — Inpatient Hospital Stay (HOSPITAL_COMMUNITY): Payer: Medicare Other

## 2011-09-26 LAB — BASIC METABOLIC PANEL
CO2: 27 mEq/L (ref 19–32)
Calcium: 8.4 mg/dL (ref 8.4–10.5)
Chloride: 106 mEq/L (ref 96–112)
Creatinine, Ser: 0.71 mg/dL (ref 0.50–1.10)
Glucose, Bld: 105 mg/dL — ABNORMAL HIGH (ref 70–99)

## 2011-09-26 LAB — CBC
HCT: 33.5 % — ABNORMAL LOW (ref 36.0–46.0)
Hemoglobin: 10.5 g/dL — ABNORMAL LOW (ref 12.0–15.0)
MCH: 29 pg (ref 26.0–34.0)
MCV: 92.5 fL (ref 78.0–100.0)
RBC: 3.62 MIL/uL — ABNORMAL LOW (ref 3.87–5.11)
WBC: 6.8 10*3/uL (ref 4.0–10.5)

## 2011-09-26 NOTE — Progress Notes (Signed)
Patient npo 

## 2011-09-26 NOTE — Progress Notes (Signed)
Subjective: Vomited yesterday PM, requiring NG-T. Passed flatus x 3 today.   Objective: Vital signs in last 24 hours: Temp:  [97.4 F (36.3 C)-98.5 F (36.9 C)] 97.4 F (36.3 C) (12/20 0917) Pulse Rate:  [78-88] 82  (12/20 0917) Resp:  [18-20] 20  (12/20 0917) BP: (111-130)/(68-78) 130/77 mmHg (12/20 0917) SpO2:  [91 %-98 %] 98 % (12/20 0917) Weight:  [87.363 kg (192 lb 9.6 oz)] 192 lb 9.6 oz (87.363 kg) (12/19 2157) Weight change: 0.771 kg (1 lb 11.2 oz) Last BM Date: 09/24/11  Intake/Output from previous day: 12/19 0701 - 12/20 0700 In: 1978.3 [I.V.:1978.3] Out: 305 [Urine:203; Emesis/NG output:102]     Physical Exam: General: Comfortable, alert, communicative, fully oriented, not short of breath at rest. NG-T in situ, with minimal aspirate. HEENT:  Mild clinical pallor, no jaundice, no conjunctival injection or discharge. Hydration is fair. NECK:  Supple, JVP not seen, no carotid bruits, no palpable lymphadenopathy, no palpable goiter. CHEST:  Clinically clear to auscultation, no wheezes, no crackles. HEART:  Sounds 1 and 2 heard, normal, regular, no murmurs. ABDOMEN:  Full, soft, non-tender, no palpable organomegaly, no palpable masses, normal bowel sounds. GENITALIA:  Not examined. LOWER EXTREMITIES:  No pitting edema, palpable peripheral pulses. MUSCULOSKELETAL SYSTEM:  Generalized osteoarthritic changes, otherwise, normal. CENTRAL NERVOUS SYSTEM:  No focal neurologic deficit on gross examination.  Lab Results:  [facility] 09/26/11 0520 09/25/11 0550  WBC 6.8 11.5*  HGB 10.5* 10.9*  HCT 33.5* 35.1*  PLT 188 211    Basename 09/26/11 0520 09/25/11 0550  NA 140 140  K 3.5 3.9  CL 106 104  CO2 27 29  GLUCOSE 105* 123*  BUN 16 12  CREATININE 0.71 0.75  CALCIUM 8.4 8.5   Recent Results (from the past 240 hour(s))  URINE CULTURE     Status: Normal   Collection Time   09/24/11  2:11 PM      Component Value Range Status Comment   Specimen Description URINE,  CLEAN CATCH   Final    Special Requests NONE   Final    Setup Time [id number]   Final    Colony Count NO GROWTH   Final    Culture NO GROWTH   Final    Report Status 09/25/2011 FINAL   Final      Studies/Results: Dg Abd 1 View  09/26/2011  *RADIOLOGY REPORT*  Clinical Data: Partial small bowel obstruction.  Nausea.  ABDOMEN - 1 VIEW  Comparison: 09/25/2011.  Findings: Oral contrast is seen in the colon.  There are a few mildly dilated loops of small bowel in the left abdomen.  IMPRESSION: Mild residual partial small bowel obstruction with interval passage of contrast into the colon.  Original Report Authenticated By: Reyes Ivan, M.D.   Ct Abdomen Pelvis W Contrast  09/24/2011  *RADIOLOGY REPORT*  Clinical Data: Severe abdominal pain and history of small bowel obstruction.  CT ABDOMEN AND PELVIS WITH CONTRAST  Technique:  Multidetector CT imaging of the abdomen and pelvis was performed following the standard protocol during bolus administration of intravenous contrast.  Contrast: 80mL OMNIPAQUE IOHEXOL 300 MG/ML IV SOLN  Comparison: 04/15/2010  Findings: There is evidence of a small bowel obstruction with moderate dilatation of loops of ileum in the pelvis measuring up to roughly 3.3 cm in maximal caliber. Dilated loops contain fecalized contents.  Small bowel dilatation can be followed up to a transition point at the level of the prior partial small bowel resection and visible anastomosis in  the posterior midline pelvis at the level of the upper sacrum appreciated on axial images #56- 62. This anastomosis is fairly close to the terminal ileum.  There is no evidence of bowel perforation or abscess.  Mild mesenteric edema noted.  Proximal small bowel loops are fairly normal in caliber and contains ingested oral contrast.  The colon is of normal caliber and contains scattered fecal material and air.  The liver, gallbladder, pancreas, spleen, adrenal glands and kidneys are unremarkable.  Small  hiatal hernia present.  No other hernias identified.  The bladder is unremarkable.  Bony structures are unremarkable.  IMPRESSION: Evidence of partial small bowel obstruction with dilatation primarily of the ileum which can be followed to the level of a small bowel surgical anastomosis close to the terminal ileum.  Original Report Authenticated By: Reola Calkins, M.D.   Acute Abdominal Series  09/25/2011  *RADIOLOGY REPORT*  Clinical Data: Mid to lower right sided abdominal pain, some nausea and vomiting  ACUTE ABDOMEN SERIES (ABDOMEN 2 VIEW & CHEST 1 VIEW)  Comparison: CT abdomen pelvis of 09/24/2011 and chest with acute abdomen of 09/24/2011  Findings: There is mild bibasilar linear atelectasis present.  No focal infiltrate or effusion is seen.  The heart is mildly enlarged and stable.  There are dilated small bowel loops with differential air-fluid levels consistent with partial small bowel obstruction.  No free air is seen.  No distention of the colon is noted.  Contrast is noted in the urinary bladder from recent CT of the abdomen and pelvis.  No bony abnormality is seen other than diffuse osteopenia.  IMPRESSION:  1.  Partial small bowel obstruction.  No free air. 2.  Mild bibasilar atelectasis.  Original Report Authenticated By: Juline Patch, M.D.   Dg Abd Acute W/chest  09/24/2011  *RADIOLOGY REPORT*  Clinical Data: Abdominal pain.  Nausea.  ACUTE ABDOMEN SERIES (ABDOMEN 2 VIEW & CHEST 1 VIEW)  Comparison: Radiographs dated 04/18/2010 and 10/18/2005  Findings: Heart and lungs are within normal limits.  No free air in the abdomen.  Bowel gas pattern is normal.  No acute osseous abnormality.  IMPRESSION: Benign-appearing abdomen and chest.  Original Report Authenticated By: Gwynn Burly, M.D.    Medications: Scheduled Meds:    . enoxaparin  40 mg Subcutaneous Q24H  . white petrolatum       Continuous Infusions:    . 0.9 % NaCl with KCl 20 mEq / L 100 mL/hr at 09/26/11 0445   PRN  Meds:.acetaminophen, acetaminophen, HYDROmorphone, ondansetron (ZOFRAN) IV, ondansetron  Assessment/Plan:  Principal Problem:  *Gastroesophageal reflux disease: Not problematic. Active Problems:  1. Small bowel obstruction: Stable. AXR of 09/26/11, is improved.  Continue bowel rest, iv fluids. Surgical team clamping NG-T today. Manage per surgical recommendations. Partial SBO appears to be resolving.  2. Hypertension: Controlled.  3. Hypokalemia: Repleted as indicated.  Comment: Abdominal X-Ray in AM 09/27/11.  Disp: Per surgeons.  LOS: 2 days   Genelle Economou,CHRISTOPHER 09/26/2011, 12:18 PM

## 2011-09-26 NOTE — Progress Notes (Signed)
  Subjective: Pt ok, NG now in after N/V yesterday afternoon. Feeling better thias am. 2 BMs already, NG output is scant.  Objective: Vital signs in last 24 hours: Temp:  [97.7 F (36.5 C)-98.5 F (36.9 C)] 97.8 F (36.6 C) (12/20 0527) Pulse Rate:  [78-88] 82  (12/20 0527) Resp:  [18-20] 19  (12/20 0527) BP: (111-125)/(68-78) 111/71 mmHg (12/20 0527) SpO2:  [91 %-96 %] 96 % (12/20 0527) Weight:  [87.363 kg (192 lb 9.6 oz)] 192 lb 9.6 oz (87.363 kg) (12/19 2157) Last BM Date: 09/24/11  Intake/Output this shift:    Physical Exam: BP 111/71  Pulse 82  Temp(Src) 97.8 F (36.6 C) (Oral)  Resp 19  Ht 5\' 3"  (1.6 m)  Wt 87.363 kg (192 lb 9.6 oz)  BMI 34.12 kg/m2  SpO2 96% Abdomen: soft, ND, NT Few BS  Labs: CBC  Basename 09/26/11 0520 09/25/11 0550  WBC 6.8 11.5*  HGB 10.5* 10.9*  HCT 33.5* 35.1*  PLT 188 211   BMET  Basename 09/26/11 0520 09/25/11 0550  NA 140 140  K 3.5 3.9  CL 106 104  CO2 27 29  GLUCOSE 105* 123*  BUN 16 12  CREATININE 0.71 0.75  CALCIUM 8.4 8.5   LFT  Basename 09/25/11 0550  PROT 6.9  ALBUMIN 3.1*  AST 16  ALT 15  ALKPHOS 61  BILITOT 0.2*  BILIDIR --  IBILI --  LIPASE --   PT/INR No results found for this basename: LABPROT:2,INR:2 in the last 72 hours ABG No results found for this basename: PHART:2,PCO2:2,PO2:2,HCO3:2 in the last 72 hours  Studies/Results: X-rays: Await report but looks better, contrast into distal colon.   Assessment: Principal Problem:  *Gastroesophageal reflux disease Active Problems:  Small bowel obstruction  Hypertension  Hypokalemia     Plan: Clamping cycle of NG, but will not remove today as it was just placed last night. OOB If continues bowel activity, dc NG tomorrow and start diet.  LOS: 2 days    Marianna Fuss 09/26/2011

## 2011-09-26 NOTE — Progress Notes (Signed)
Gastric tube - today pt has been clamped for 3 hours, then LIWS for 1 hour, cycle repeated twice - Pt has tolerated well with no vomiting, nausea or abdominal pain.

## 2011-09-27 ENCOUNTER — Inpatient Hospital Stay (HOSPITAL_COMMUNITY): Payer: Medicare Other

## 2011-09-27 LAB — CBC
HCT: 34.4 % — ABNORMAL LOW (ref 36.0–46.0)
MCH: 28.8 pg (ref 26.0–34.0)
MCV: 91.7 fL (ref 78.0–100.0)
Platelets: 204 10*3/uL (ref 150–400)
RDW: 14.3 % (ref 11.5–15.5)

## 2011-09-27 LAB — BASIC METABOLIC PANEL
BUN: 10 mg/dL (ref 6–23)
CO2: 24 mEq/L (ref 19–32)
Calcium: 9.2 mg/dL (ref 8.4–10.5)
Chloride: 102 mEq/L (ref 96–112)
Creatinine, Ser: 0.67 mg/dL (ref 0.50–1.10)
GFR calc Af Amer: >90 mL/min (ref >90)

## 2011-09-27 NOTE — Progress Notes (Signed)
Subjective: NG-T discontinued by surgical team. Patient tolerating clears and passed stools today. No abdominal pain or vomiting.   Objective: Vital signs in last 24 hours: Temp:  [97.9 F (36.6 C)-98.9 F (37.2 C)] 98.1 F (36.7 C) (12/21 1405) Pulse Rate:  [80-94] 82  (12/21 1405) Resp:  [18-20] 18  (12/21 1405) BP: (130-142)/(81-90) 140/85 mmHg (12/21 1405) SpO2:  [92 %-96 %] 96 % (12/21 1405) Weight:  [87.6 kg (193 lb 2 oz)] 193 lb 2 oz (87.6 kg) (12/20 2004) Weight change: 0.237 kg (8.4 oz) Last BM Date: 09/25/11  Intake/Output from previous day: 12/20 0701 - 12/21 0700 In: 2328.3 [I.V.:2328.3] Out: -  Total I/O In: 240 [P.O.:240] Out: -    Physical Exam: General: Comfortable, alert, communicative, fully oriented, not short of breath at rest. HEENT:  Mild clinical pallor, no jaundice, no conjunctival injection or discharge. Hydration is fair. NECK:  Supple, JVP not seen, no carotid bruits, no palpable lymphadenopathy, no palpable goiter. CHEST:  Clinically clear to auscultation, no wheezes, no crackles. HEART:  Sounds 1 and 2 heard, normal, regular, no murmurs. ABDOMEN:  Full, soft, non-tender, no palpable organomegaly, no palpable masses, normal bowel sounds. GENITALIA:  Not examined. LOWER EXTREMITIES:  No pitting edema, palpable peripheral pulses. MUSCULOSKELETAL SYSTEM:  Generalized osteoarthritic changes, otherwise, normal. CENTRAL NERVOUS SYSTEM:  No focal neurologic deficit on gross examination.  Lab Results:  [facility] 09/27/11 0535 09/26/11 0520  WBC 8.3 6.8  HGB 10.8* 10.5*  HCT 34.4* 33.5*  PLT 204 188    Basename 09/27/11 1145 09/26/11 0520  NA 137 140  K 3.8 3.5  CL 102 106  CO2 24 27  GLUCOSE 106* 105*  BUN 10 16  CREATININE 0.67 0.71  CALCIUM 9.2 8.4   Recent Results (from the past 240 hour(s))  URINE CULTURE     Status: Normal   Collection Time   09/24/11  2:11 PM      Component Value Range Status Comment   Specimen Description URINE,  CLEAN CATCH   Final    Special Requests NONE   Final    Setup Time [id number]   Final    Colony Count NO GROWTH   Final    Culture NO GROWTH   Final    Report Status 09/25/2011 FINAL   Final      Studies/Results: Dg Abd 1 View  09/26/2011  *RADIOLOGY REPORT*  Clinical Data: Partial small bowel obstruction.  Nausea.  ABDOMEN - 1 VIEW  Comparison: 09/25/2011.  Findings: Oral contrast is seen in the colon.  There are a few mildly dilated loops of small bowel in the left abdomen.  IMPRESSION: Mild residual partial small bowel obstruction with interval passage of contrast into the colon.  Original Report Authenticated By: Reyes Ivan, M.D.   Dg Abd 2 Views  09/27/2011  *RADIOLOGY REPORT*  Clinical Data: Abdominal pain, small bowel obstruction.  ABDOMEN - 2 VIEW  Comparison: 09/26/2011  Findings: NG tube coils in the lower chest, likely in a small hiatal hernia.  The tip is near the GE junction.  Continued mild central small bowel prominence, slightly improved.  Oral contrast material and gas seen within the colon.  No free air.  IMPRESSION: Feeding tube likely coils in a hiatal hernia.  The tip is near the GE junction.  Slight decreased prominence of mid abdominal small bowel loops.  Original Report Authenticated By: Cyndie Chime, M.D.    Medications: Scheduled Meds:    . enoxaparin  40  mg Subcutaneous Q24H   Continuous Infusions:    . 0.9 % NaCl with KCl 20 mEq / L 100 mL/hr at 09/27/11 1210   PRN Meds:.acetaminophen, acetaminophen, HYDROmorphone, ondansetron (ZOFRAN) IV, ondansetron  Assessment/Plan:  Principal Problem:  *Gastroesophageal reflux disease: Not problematic. Active Problems:  1. Small bowel obstruction: Stable. AXR of 09/27/11, shows continued stablity/improvement. NG-T now out, per surgical team, and tolerating clears. Continue maintenance  iv fluids for now. Manage per surgical recommendations. Partial SBO appears to be resolving.  2. Hypertension:  Controlled.  3. Hypokalemia: Repleted as indicated.  Comment: Nearing DC, clinically.  Disp: Per surgeons.  LOS: 3 days   Kacee Koren,CHRISTOPHER 09/27/2011, 5:11 PM

## 2011-09-27 NOTE — ED Provider Notes (Signed)
Evaluation and management procedures were performed by the PA/NP under my supervision/collaboration.  I evaluated this patient face-to-face at the time of encounter.  Please see my note dated at that time.   Felisa Bonier, MD 09/27/11 [phone]

## 2011-09-27 NOTE — Progress Notes (Signed)
  Subjective: Pt ok. Several more BMs yesterday. Tol NG clamping cycle well  Objective: Vital signs in last 24 hours: Temp:  [96.9 F (36.1 C)-98.2 F (36.8 C)] 98.2 F (36.8 C) (12/21 0626) Pulse Rate:  [75-84] 80  (12/21 0626) Resp:  [18-20] 18  (12/21 0626) BP: (130-135)/(77-91) 135/85 mmHg (12/21 0626) SpO2:  [93 %-98 %] 95 % (12/21 0626) Weight:  [87.6 kg (193 lb 2 oz)] 193 lb 2 oz (87.6 kg) (12/20 2004) Last BM Date: 09/25/11  Intake/Output this shift:    Physical Exam: BP 135/85  Pulse 80  Temp(Src) 98.2 F (36.8 C) (Oral)  Resp 18  Ht 5\' 3"  (1.6 m)  Wt 87.6 kg (193 lb 2 oz)  BMI 34.21 kg/m2  SpO2 95% Abdomen; soft, ND, NT Active BS  Labs: CBC  Basename 09/27/11 0535 09/26/11 0520  WBC 8.3 6.8  HGB 10.8* 10.5*  HCT 34.4* 33.5*  PLT 204 188   BMET  Basename 09/26/11 0520 09/25/11 0550  NA 140 140  K 3.5 3.9  CL 106 104  CO2 27 29  GLUCOSE 105* 123*  BUN 16 12  CREATININE 0.71 0.75  CALCIUM 8.4 8.5   LFT  Basename 09/25/11 0550  PROT 6.9  ALBUMIN 3.1*  AST 16  ALT 15  ALKPHOS 61  BILITOT 0.2*  BILIDIR --  IBILI --  LIPASE --   PT/INR No results found for this basename: LABPROT:2,INR:2 in the last 72 hours ABG No results found for this basename: PHART:2,PCO2:2,PO2:2,HCO3:2 in the last 72 hours  Studies/Results: Dg Abd 1 View  09/26/2011  *RADIOLOGY REPORT*  Clinical Data: Partial small bowel obstruction.  Nausea.  ABDOMEN - 1 VIEW  Comparison: 09/25/2011.  Findings: Oral contrast is seen in the colon.  There are a few mildly dilated loops of small bowel in the left abdomen.  IMPRESSION: Mild residual partial small bowel obstruction with interval passage of contrast into the colon.  Original Report Authenticated By: Reyes Ivan, M.D.   Dg Abd 2 Views  09/27/2011  *RADIOLOGY REPORT*  Clinical Data: Abdominal pain, small bowel obstruction.  ABDOMEN - 2 VIEW  Comparison: 09/26/2011  Findings: NG tube coils in the lower chest,  likely in a small hiatal hernia.  The tip is near the GE junction.  Continued mild central small bowel prominence, slightly improved.  Oral contrast material and gas seen within the colon.  No free air.  IMPRESSION: Feeding tube likely coils in a hiatal hernia.  The tip is near the GE junction.  Slight decreased prominence of mid abdominal small bowel loops.  Original Report Authenticated By: Cyndie Chime, M.D.    Assessment: Principal Problem:  *Gastroesophageal reflux disease Active Problems:  Small bowel obstruction  Hypertension  Hypokalemia     Plan: X-ray looks better, contrast moving through colon and SB distention decreased. Will DC NG tube and begin diet.  LOS: 3 days    Marianna Fuss 09/27/2011

## 2011-09-27 NOTE — Progress Notes (Signed)
Afternoon rounds Pt doing very well. Tolerated clears diet, no N/V/bloating. Continues to pass flatus and small BM. Ok to advance diet, prob ok for Dc tomorrow. Call if regression, signing off.

## 2011-09-28 LAB — BASIC METABOLIC PANEL
CO2: 26 mEq/L (ref 19–32)
Calcium: 8.9 mg/dL (ref 8.4–10.5)
Creatinine, Ser: 0.7 mg/dL (ref 0.50–1.10)
GFR calc non Af Amer: 86 mL/min — ABNORMAL LOW (ref >90)
Glucose, Bld: 93 mg/dL (ref 70–99)
Sodium: 139 mEq/L (ref 135–145)

## 2011-09-28 MED ORDER — SODIUM CHLORIDE 0.9 % IJ SOLN
10.0000 mL | Freq: Two times a day (BID) | INTRAMUSCULAR | Status: DC
  Administered 2011-09-28 – 2011-09-29 (×3): 10 mL via INTRAVENOUS

## 2011-09-28 NOTE — Progress Notes (Signed)
Subjective: Still tolerating clears and moving bowels, ambulating, no abdominal pain or vomiting.   Objective: Vital signs in last 24 hours: Temp:  [97.4 F (36.3 C)-98 F (36.7 C)] 97.4 F (36.3 C) (12/22 1315) Pulse Rate:  [72-87] 83  (12/22 1315) Resp:  [19-20] 20  (12/22 1315) BP: (115-152)/(76-93) 152/83 mmHg (12/22 1315) SpO2:  [91 %-96 %] 95 % (12/22 1315) Weight:  [86.7 kg (191 lb 2.2 oz)] 191 lb 2.2 oz (86.7 kg) 2023/10/14 2225) Weight change: -0.9 kg (-1 lb 15.8 oz) Last BM Date: 09/28/11  Intake/Output from previous day: 10/14/2023 0701 - 12/22 0700 In: 2347.1 [P.O.:240; I.V.:2107.1] Out: -  Total I/O In: 720 [P.O.:720] Out: -    Physical Exam: General: Comfortable, alert, communicative, fully oriented, not short of breath at rest. HEENT:  Mild clinical pallor, no jaundice, no conjunctival injection or discharge. Hydration is fair. NECK:  Supple, JVP not seen, no carotid bruits, no palpable lymphadenopathy, no palpable goiter. CHEST:  Clinically clear to auscultation, no wheezes, no crackles. HEART:  Sounds 1 and 2 heard, normal, regular, no murmurs. ABDOMEN:  Full, soft, non-tender, no palpable organomegaly, no palpable masses, normal bowel sounds. GENITALIA:  Not examined. LOWER EXTREMITIES:  No pitting edema, palpable peripheral pulses. MUSCULOSKELETAL SYSTEM:  Generalized osteoarthritic changes, otherwise, normal. CENTRAL NERVOUS SYSTEM:  No focal neurologic deficit on gross examination.  Lab Results:  [facility] 10-14-2011 0535 09/26/11 0520  WBC 8.3 6.8  HGB 10.8* 10.5*  HCT 34.4* 33.5*  PLT 204 188    Basename 09/28/11 0600 2011-10-14 1145  NA 139 137  K 3.8 3.8  CL 105 102  CO2 26 24  GLUCOSE 93 106*  BUN 8 10  CREATININE 0.70 0.67  CALCIUM 8.9 9.2   Recent Results (from the past 240 hour(s))  URINE CULTURE     Status: Normal   Collection Time   09/24/11  2:11 PM      Component Value Range Status Comment   Specimen Description URINE, CLEAN CATCH    Final    Special Requests NONE   Final    Setup Time [id number]   Final    Colony Count NO GROWTH   Final    Culture NO GROWTH   Final    Report Status 09/25/2011 FINAL   Final      Studies/Results: Dg Abd 2 Views  October 14, 2011  *RADIOLOGY REPORT*  Clinical Data: Abdominal pain, small bowel obstruction.  ABDOMEN - 2 VIEW  Comparison: 09/26/2011  Findings: NG tube coils in the lower chest, likely in a small hiatal hernia.  The tip is near the GE junction.  Continued mild central small bowel prominence, slightly improved.  Oral contrast material and gas seen within the colon.  No free air.  IMPRESSION: Feeding tube likely coils in a hiatal hernia.  The tip is near the GE junction.  Slight decreased prominence of mid abdominal small bowel loops.  Original Report Authenticated By: Cyndie Chime, M.D.    Medications: Scheduled Meds:    . enoxaparin  40 mg Subcutaneous Q24H   Continuous Infusions:    . 0.9 % NaCl with KCl 20 mEq / L 75 mL/hr at 09/28/11 1429   PRN Meds:.acetaminophen, acetaminophen, HYDROmorphone, ondansetron (ZOFRAN) IV, ondansetron  Assessment/Plan:  Principal Problem:  *Gastroesophageal reflux disease: Not problematic. Active Problems:  1. Small bowel obstruction: Stable. AXR of Oct 14, 2011, shows continued stablity/improvement. NG-T out on 09/28/11, per surgical team, and tolerating clears. Hydration is fair. Will discontinue iv fluids. Manage per  surgical recommendations. Partial SBO appears to be practically resolved, clinically.  2. Hypertension: Controlled.  3. Hypokalemia: Repleted as indicated.  Comment: Nearing DC.  Disp: Per surgeons.  LOS: 4 days   Aariel Ems,CHRISTOPHER 09/28/2011, 2:46 PM

## 2011-09-28 NOTE — Progress Notes (Signed)
  Subjective: Pt doing well. Order to advance diet as tolerated was put i yesterday, though she still got liquids. 2 BMs this am. NO N/V No new c/o  Objective: Vital signs in last 24 hours: Temp:  [97.4 F (36.3 C)-98 F (36.7 C)] 97.4 F (36.3 C) (12/22 1315) Pulse Rate:  [72-87] 83  (12/22 1315) Resp:  [19-20] 20  (12/22 1315) BP: (115-152)/(76-93) 152/83 mmHg (12/22 1315) SpO2:  [91 %-96 %] 95 % (12/22 1315) Weight:  [86.7 kg (191 lb 2.2 oz)] 191 lb 2.2 oz (86.7 kg) 10-05-23 2225) Last BM Date: 09/28/11  Intake/Output this shift: Total I/O In: 720 [P.O.:720] Out: -   Physical Exam: BP 152/83  Pulse 83  Temp(Src) 97.4 F (36.3 C) (Oral)  Resp 20  Ht 5\' 3"  (1.6 m)  Wt 86.7 kg (191 lb 2.2 oz)  BMI 33.86 kg/m2  SpO2 95% Abdomen: soft, ND, NT  Labs: CBC  Basename 05-Oct-2011 0535 09/26/11 0520  WBC 8.3 6.8  HGB 10.8* 10.5*  HCT 34.4* 33.5*  PLT 204 188   BMET  Basename 09/28/11 0600 2011/10/05 1145  NA 139 137  K 3.8 3.8  CL 105 102  CO2 26 24  GLUCOSE 93 106*  BUN 8 10  CREATININE 0.70 0.67  CALCIUM 8.9 9.2   LFT No results found for this basename: PROT,ALBUMIN,AST,ALT,ALKPHOS,BILITOT,BILIDIR,IBILI,LIPASE in the last 72 hours PT/INR No results found for this basename: LABPROT:2,INR:2 in the last 72 hours ABG No results found for this basename: PHART:2,PCO2:2,PO2:2,HCO3:2 in the last 72 hours  Studies/Results: Dg Abd 2 Views  10/05/2011  *RADIOLOGY REPORT*  Clinical Data: Abdominal pain, small bowel obstruction.  ABDOMEN - 2 VIEW  Comparison: 09/26/2011  Findings: NG tube coils in the lower chest, likely in a small hiatal hernia.  The tip is near the GE junction.  Continued mild central small bowel prominence, slightly improved.  Oral contrast material and gas seen within the colon.  No free air.  IMPRESSION: Feeding tube likely coils in a hiatal hernia.  The tip is near the GE junction.  Slight decreased prominence of mid abdominal small bowel loops.   Original Report Authenticated By: Cyndie Chime, M.D.    Assessment: Principal Problem:  *Gastroesophageal reflux disease Active Problems:  Small bowel obstruction  Hypertension  Hypokalemia     Plan: Regular diet DC per primary team  LOS: 4 days    Marianna Fuss 09/28/2011

## 2011-09-29 LAB — BASIC METABOLIC PANEL
BUN: 13 mg/dL (ref 6–23)
Calcium: 9.2 mg/dL (ref 8.4–10.5)
GFR calc Af Amer: >90 mL/min (ref >90)
GFR calc non Af Amer: 84 mL/min — ABNORMAL LOW (ref >90)
Glucose, Bld: 108 mg/dL — ABNORMAL HIGH (ref 70–99)
Potassium: 3.7 mEq/L (ref 3.5–5.1)

## 2011-09-29 MED ORDER — PANTOPRAZOLE SODIUM 40 MG PO TBEC: 40.0000 mg | DELAYED_RELEASE_TABLET | Freq: Every day | ORAL | Status: DC

## 2011-09-29 MED ORDER — ONDANSETRON HCL 4 MG PO TABS: 4.0000 mg | ORAL_TABLET | Freq: Four times a day (QID) | ORAL | Status: AC | PRN

## 2011-09-29 MED ORDER — PANTOPRAZOLE SODIUM 40 MG PO TBEC
40.0000 mg | DELAYED_RELEASE_TABLET | Freq: Every day | ORAL | Status: DC
  Administered 2011-09-29: 40 mg via ORAL
  Filled 2011-09-29: qty 1

## 2011-09-29 NOTE — Progress Notes (Signed)
Agree Sarabelle Genson E  

## 2011-09-29 NOTE — Discharge Summary (Signed)
Discharge Summary  Stacey [name] MR#: [id number]  DOB:12-03-1940  Date of Admission: 09/24/2011 Date of Discharge: 09/29/2011  Patient's PCP: No primary provider on file.  Attending Physician:Addalyne Vandehei  Consults:   General surgery: Dr. Harriette Bouillon 09/24/2011  Discharge Diagnoses: Partial Small bowel obstruction Present on Admission:  .Small bowel obstruction .Hypertension .Hypokalemia .Gastroesophageal reflux disease  Brief Admitting History and Physical 70 year old female with a history of small bowel obstruction is presenting to the ED with chief complaints of abdominal pain. Patient states to woke up this morning with an acute onset of abdominal pain. Pain is described pain as throbbing, and gnawing involving affecting her right lower quadrant radiating across the abdomen. Pain is unimproved with movement. She has not had much nausea or vomiting.The patient has a history of  small bowel resection and 2005 where she had an incarcerated ventral  hernia which required a small-bowel extortion which required exploratory  laparotomy to remove adhesions. In a similar episode in July of 2011 that was treated conservatively. She denies fever, headache, nausea, vomiting, diarrhea, chest pain, shortness of breath, back pain, dysuria, vaginal discharge. She did had a small bowel movement this a.m. she denies seeing blood or black stool. Patient states her pain feels similar to a prior small bowel obstruction. She rates her pain is 10 out of 10. She denies any recent strenuous exercise or recent trauma. She denies recreational drug use or alcohol use. She did have prior abdominal surgery. For the rest of the admission history and physical please see H&P dictated by Dr. Susie Cassette   Discharge Medications Medication List  As of 09/29/2011  1:48 PM   START taking these medications         ondansetron 4 MG tablet   Commonly known as: ZOFRAN   Take 1 tablet (4 mg total) by mouth every 6  (six) hours as needed for nausea.      pantoprazole 40 MG tablet   Commonly known as: PROTONIX   Take 1 tablet (40 mg total) by mouth daily at 6 (six) AM.         CONTINUE taking these medications         acetaminophen 500 MG tablet   Commonly known as: TYLENOL      mulitivitamin with minerals Tabs          Where to get your medications    These are the prescriptions that you need to pick up.   You may get these medications from any pharmacy.         ondansetron 4 MG tablet   pantoprazole 40 MG tablet           Hospital Course: #1 Partial small bowel obstruction Patient was admitted with a diagnosis of partial small bowel obstruction. Patient was initially made n.p.o. placed on IV fluids and supportive care. Serial abdominal films were also obtained. Patient was seen in consultation by general surgery Dr. Luisa Hart who saw patient on 09/24/2011 agree to conservative therapy initially. It was also recommended that if patient continued to have emesis and NG tube needed to be placed. During the hospitalization on hospital day #2 patient did have some nausea and emesis and a such NG tube needed to be placed. Patient's abdominal cramping improved with the NG tube in place and patient started to improve clinically. Patient subsequently started to have bowel movements, with scant in NG output. NG tube was subsequently clamped for 3 hours, patient tolerated this well did not have any further emesis  nausea or worsening abdominal pain. Patient continued to improve clinically serial x-rays also improved with contrast moving through her: And decrease in the small bowel distention. NG tube was subsequently discontinued and patient started on clear liquids. Patient continued to tolerate clears, continued to pass gas and have bowel movements, abdominal pain also improved and a such diet was advanced. Patient was able to tolerate a regular diet with no abdominal pain, no nausea, no emesis, and continued  to have regular bowel movements. Patient will be discharged home with resolution of her partial small bowel obstruction will need to followup with her primary care physician one week post discharge. #2 gastroesophageal reflux disease Patient will be discharged home on Protonix and will need to followup with PCP as outpatient The rest of patient's chronic medical issues remained stable throughout the hospitalization and patient be discharged in stable and improved condition.   Present on Admission:  . partial Small bowel obstruction .Hypertension .Hypokalemia .Gastroesophageal reflux disease   Day of Discharge BP 124/83  Pulse 82  Temp(Src) 98 F (36.7 C) (Oral)  Resp 17  Ht 5\' 3"  (1.6 m)  Wt 85 kg (187 lb 6.3 oz)  BMI 33.19 kg/m2  SpO2 96% Subjective: Patient denies any abdominal pain. Patient tolerated oral intake. No nausea no vomiting. Patient had 2 bowel movements today. General: Alert, awake, oriented x3, in no acute distress Heart: Regular rate and rhythm, without murmurs, rubs, gallops. Lungs: Clear to auscultation bilaterally. Abdomen: Soft, minimal abdominal discomfort, nondistended, positive bowel sounds. Extremities: No clubbing cyanosis or edema with positive pedal pulses. Neuro: Grossly intact, nonfocal.    Results for orders placed during the hospital encounter of 09/24/11 (from the past 48 hour(s))  BASIC METABOLIC PANEL     Status: Abnormal   Collection Time   09/28/11  6:00 AM      Component Value Range Comment   Sodium 139  135 - 145 (mEq/L)    Potassium 3.8  3.5 - 5.1 (mEq/L)    Chloride 105  96 - 112 (mEq/L)    CO2 26  19 - 32 (mEq/L)    Glucose, Bld 93  70 - 99 (mg/dL)    BUN 8  6 - 23 (mg/dL)    Creatinine, Ser 4.09  0.50 - 1.10 (mg/dL)    Calcium 8.9  8.4 - 10.5 (mg/dL)    GFR calc non Af Amer 86 (*) >90 (mL/min)    GFR calc Af Amer >90  >90 (mL/min)   BASIC METABOLIC PANEL     Status: Abnormal   Collection Time   09/29/11  7:10 AM       Component Value Range Comment   Sodium 139  135 - 145 (mEq/L)    Potassium 3.7  3.5 - 5.1 (mEq/L)    Chloride 104  96 - 112 (mEq/L)    CO2 27  19 - 32 (mEq/L)    Glucose, Bld 108 (*) 70 - 99 (mg/dL)    BUN 13  6 - 23 (mg/dL)    Creatinine, Ser 8.11  0.50 - 1.10 (mg/dL)    Calcium 9.2  8.4 - 10.5 (mg/dL)    GFR calc non Af Amer 84 (*) >90 (mL/min)    GFR calc Af Amer >90  >90 (mL/min)     Dg Abd 1 View  09/26/2011  *RADIOLOGY REPORT*  Clinical Data: Partial small bowel obstruction.  Nausea.  ABDOMEN - 1 VIEW  Comparison: 09/25/2011.  Findings: Oral contrast is seen in the colon.  There are a few mildly dilated loops of small bowel in the left abdomen.  IMPRESSION: Mild residual partial small bowel obstruction with interval passage of contrast into the colon.  Original Report Authenticated By: Reyes Ivan, M.D.   Ct Abdomen Pelvis W Contrast  09/24/2011  *RADIOLOGY REPORT*  Clinical Data: Severe abdominal pain and history of small bowel obstruction.  CT ABDOMEN AND PELVIS WITH CONTRAST  Technique:  Multidetector CT imaging of the abdomen and pelvis was performed following the standard protocol during bolus administration of intravenous contrast.  Contrast: 80mL OMNIPAQUE IOHEXOL 300 MG/ML IV SOLN  Comparison: 04/15/2010  Findings: There is evidence of a small bowel obstruction with moderate dilatation of loops of ileum in the pelvis measuring up to roughly 3.3 cm in maximal caliber. Dilated loops contain fecalized contents.  Small bowel dilatation can be followed up to a transition point at the level of the prior partial small bowel resection and visible anastomosis in the posterior midline pelvis at the level of the upper sacrum appreciated on axial images #56- 62. This anastomosis is fairly close to the terminal ileum.  There is no evidence of bowel perforation or abscess.  Mild mesenteric edema noted.  Proximal small bowel loops are fairly normal in caliber and contains ingested oral  contrast.  The colon is of normal caliber and contains scattered fecal material and air.  The liver, gallbladder, pancreas, spleen, adrenal glands and kidneys are unremarkable.  Small hiatal hernia present.  No other hernias identified.  The bladder is unremarkable.  Bony structures are unremarkable.  IMPRESSION: Evidence of partial small bowel obstruction with dilatation primarily of the ileum which can be followed to the level of a small bowel surgical anastomosis close to the terminal ileum.  Original Report Authenticated By: Reola Calkins, M.D.   Dg Abd 2 Views  09/27/2011  *RADIOLOGY REPORT*  Clinical Data: Abdominal pain, small bowel obstruction.  ABDOMEN - 2 VIEW  Comparison: 09/26/2011  Findings: NG tube coils in the lower chest, likely in a small hiatal hernia.  The tip is near the GE junction.  Continued mild central small bowel prominence, slightly improved.  Oral contrast material and gas seen within the colon.  No free air.  IMPRESSION: Feeding tube likely coils in a hiatal hernia.  The tip is near the GE junction.  Slight decreased prominence of mid abdominal small bowel loops.  Original Report Authenticated By: Cyndie Chime, M.D.   Acute Abdominal Series  09/25/2011  *RADIOLOGY REPORT*  Clinical Data: Mid to lower right sided abdominal pain, some nausea and vomiting  ACUTE ABDOMEN SERIES (ABDOMEN 2 VIEW & CHEST 1 VIEW)  Comparison: CT abdomen pelvis of 09/24/2011 and chest with acute abdomen of 09/24/2011  Findings: There is mild bibasilar linear atelectasis present.  No focal infiltrate or effusion is seen.  The heart is mildly enlarged and stable.  There are dilated small bowel loops with differential air-fluid levels consistent with partial small bowel obstruction.  No free air is seen.  No distention of the colon is noted.  Contrast is noted in the urinary bladder from recent CT of the abdomen and pelvis.  No bony abnormality is seen other than diffuse osteopenia.  IMPRESSION:  1.   Partial small bowel obstruction.  No free air. 2.  Mild bibasilar atelectasis.  Original Report Authenticated By: Juline Patch, M.D.   Dg Abd Acute W/chest  09/24/2011  *RADIOLOGY REPORT*  Clinical Data: Abdominal pain.  Nausea.  ACUTE ABDOMEN SERIES (  ABDOMEN 2 VIEW & CHEST 1 VIEW)  Comparison: Radiographs dated 04/18/2010 and 10/18/2005  Findings: Heart and lungs are within normal limits.  No free air in the abdomen.  Bowel gas pattern is normal.  No acute osseous abnormality.  IMPRESSION: Benign-appearing abdomen and chest.  Original Report Authenticated By: Gwynn Burly, M.D.     Disposition: Home  Diet: Regular  Activity: Increase activity slowly   Follow-up Appts: Discharge Orders    Future Orders Please Complete By Expires   Diet general      Increase activity slowly      Discharge instructions      Comments:   Follow up with Dr Jocelyn Lamer [facility] in 1 week..       TESTS THAT NEED FOLLOW-UP BMET  Time spent on discharge, talking to the patient, and coordinating care: 60 mins.   SignedRamiro Harvest 09/29/2011, 1:48 PM

## 2011-09-29 NOTE — Discharge Instructions (Signed)
Small Bowel Obstruction     A small bowel obstruction is a blockage (obstruction) of the small intestine (small bowel). The small bowel is a long, slender tube that connects the stomach to the colon. Its job is to absorb nutrients from the fluids and foods you consume into the bloodstream.   CAUSES   There are many causes of intestinal blockage. The most common ones include:  · Hernias. This is a more common cause in children than adults.   · Inflammatory bowel disease (enteritis and colitis).   · Twisting of the bowel (volvulus).   · Tumors.   · Scar tissue (adhesions) from previous surgery or radiation treatment.   · Recent surgery. This may cause an acute small bowel obstruction called an ileus.   SYMPTOMS   · Abdominal pain. This may be dull cramps or sharp pain. It may occur in one area or may be present in the entire abdomen. Pain can range from mild to severe, depending on the degree of obstruction.   · Nausea and vomiting. Vomit may be greenish or yellow bile color.   · Distended or swollen stomach. Abdominal bloating is a common symptom.   · Constipation.   · Lack of passing gas.   · Frequent belching.   · Diarrhea. This may occur if runny stool is able to leak around the obstruction.   DIAGNOSIS   Your caregiver can usually diagnose small bowel obstruction by taking a history, doing a physical exam, and taking X-rays. If the cause is unclear, a CT scan (computerized tomography) of your abdomen and pelvis may be needed.  TREATMENT   Treatment of the blockage depends on the cause and how bad the problem is.   · Sometimes, the obstruction improves with bed rest and intravenous (IV) fluids.   · Resting the bowel is very important. This means following a simple diet. Sometimes, a clear liquid diet may be required for several days.   · Sometimes, a small tube (nasogastric tube) is placed into the stomach to decompress the bowel. When the bowel is blocked, it usually swells up like a balloon filled with air and  fluids. Decompression means that the air and fluids are removed by suction through that tube. This can help with pain, discomfort, and nausea. It can also help the obstruction resolve faster.   · Surgery may be required if other treatments do not work. Bowel obstruction from a hernia may require early surgery and can be an emergency procedure. Adhesions that cause frequent or severe obstructions may also require surgery.   HOME CARE INSTRUCTIONS  If your bowel obstruction is only partial or incomplete, you may be allowed to go home.  · Get plenty of rest.   · Follow your diet as directed by your caregiver.   · Only consume clear liquids until your condition improves.   · Avoid solid foods as instructed.   SEEK IMMEDIATE MEDICAL CARE IF:  · You have increased pain or cramping.   · You vomit blood.   · You have uncontrolled vomiting or nausea.   · You cannot drink fluids due to vomiting or pain.   · You develop confusion.   · You begin feeling very dry or thirsty (dehydrated).   · You have severe bloating.   · You have chills.   · You have a fever.   · You feel extremely weak or you faint.   MAKE SURE YOU:  · Understand these instructions.   · Will 

## 2012-05-08 ENCOUNTER — Encounter (HOSPITAL_COMMUNITY): Payer: Self-pay | Admitting: Emergency Medicine

## 2012-05-08 DIAGNOSIS — L02219 Cutaneous abscess of trunk, unspecified: Secondary | ICD-10-CM | POA: Insufficient documentation

## 2012-05-08 DIAGNOSIS — M129 Arthropathy, unspecified: Secondary | ICD-10-CM | POA: Insufficient documentation

## 2012-05-08 DIAGNOSIS — L03319 Cellulitis of trunk, unspecified: Secondary | ICD-10-CM | POA: Diagnosis not present

## 2012-05-08 NOTE — ED Notes (Signed)
Noticed 2 spots, one on each hip --unsure of when noticed---; pt has redness and welling to sites; with R one with open part

## 2012-05-09 ENCOUNTER — Emergency Department (HOSPITAL_COMMUNITY)
Admission: EM | Admit: 2012-05-09 | Discharge: 2012-05-09 | Disposition: A | Discharge status: Home / Self Care | Payer: Medicare Other | Attending: Emergency Medicine | Admitting: Emergency Medicine

## 2012-05-09 DIAGNOSIS — M129 Arthropathy, unspecified: Secondary | ICD-10-CM | POA: Diagnosis not present

## 2012-05-09 DIAGNOSIS — IMO0002 Reserved for concepts with insufficient information to code with codable children: Secondary | ICD-10-CM

## 2012-05-09 DIAGNOSIS — L02219 Cutaneous abscess of trunk, unspecified: Secondary | ICD-10-CM | POA: Diagnosis not present

## 2012-05-09 MED ORDER — CLINDAMYCIN HCL 300 MG PO CAPS: 300.0000 mg | ORAL_CAPSULE | Freq: Four times a day (QID) | ORAL | Status: AC

## 2012-05-09 MED ORDER — SULFAMETHOXAZOLE-TRIMETHOPRIM 800-160 MG PO TABS: 1.0000 | ORAL_TABLET | Freq: Two times a day (BID) | ORAL | Status: AC

## 2012-05-09 MED ORDER — FLUCONAZOLE 200 MG PO TABS: 200.0000 mg | ORAL_TABLET | Freq: Every day | ORAL | Status: AC

## 2012-05-09 MED ORDER — CEPHALEXIN 500 MG PO CAPS: 500.0000 mg | ORAL_CAPSULE | Freq: Four times a day (QID) | ORAL | Status: AC

## 2012-05-09 MED ORDER — HYDROCODONE-ACETAMINOPHEN 5-325 MG PO TABS: 1.0000 | ORAL_TABLET | ORAL | Status: AC | PRN

## 2012-05-09 NOTE — ED Provider Notes (Signed)
History     CSN: [id number]  Arrival date & time 05/08/12  2239   First MD Initiated Contact with Patient 05/09/12 0118      Chief Complaint  Patient presents with  . Abscess   HPI  History provided by the patient. Patient is a 71 year old female who presents with complaints of redness and swelling of skin to bilateral flanks. Patient reports having small areas of redness and swelling to the skin on the flanks that began one week ago. She states that she noticed a small pimple-like area with small amounts of pus. She did squeeze these areas and caused some drainage. Since that time there has been increased swelling and redness. Patient reports having only mild tenderness to the right side but has significant tenderness and pain to the left side. She denies any erythematous streaks. She denies any fever, chills or sweats. She denies having similar symptoms previously.    Past Medical History  Diagnosis Date  . Bowel obstruction   . Arthritis     Past Surgical History  Procedure Date  . Partial hysterectomy   . Oophorectomy   . Hernia repair     History reviewed. No pertinent family history.  History  Substance Use Topics  . Smoking status: Never Smoker   . Smokeless tobacco: Not on file  . Alcohol Use: No    OB History    Grav Para Term Preterm Abortions TAB SAB Ect Mult Living                  Review of Systems  Constitutional: Negative for fever and chills.  Gastrointestinal: Negative for nausea, vomiting and abdominal pain.  Skin: Positive for rash.       Redness and swelling to bilateral flanks    Allergies  Review of patient's allergies indicates no known allergies.  Home Medications   Current Outpatient Rx  Name Route Sig Dispense Refill  . OMEGA-3 FATTY ACIDS 1000 MG PO CAPS Oral Take 1 g by mouth 2 (two) times daily.    . ADULT MULTIVITAMIN W/MINERALS CH Oral Take 1 tablet by mouth daily.      Marland Kitchen OMEPRAZOLE MAGNESIUM 20 MG PO TBEC Oral Take 20 mg by  mouth daily.      BP 146/76  Pulse 97  Temp 97.6 F (36.4 C) (Oral)  Resp 18  SpO2 96%  Physical Exam  Nursing note and vitals reviewed. Constitutional: She is oriented to person, place, and time. She appears well-developed and well-nourished. No distress.  HENT:  Head: Normocephalic.  Cardiovascular: Normal rate and regular rhythm.   Pulmonary/Chest: Effort normal and breath sounds normal.  Abdominal: Soft. There is no tenderness.  Neurological: She is alert and oriented to person, place, and time.  Skin: Skin is warm and dry. No rash noted.       2 cm nodular area of induration to the right flank with surrounding erythema. There is a small amount of purulent drainage. The area with very minimal tenderness.   Similar to 3 cm nodular area of left flank with induration and tenderness. There is small amounts of purulent drainage and surrounding erythema.  Psychiatric: She has a normal mood and affect. Her behavior is normal.    ED Course  Procedures   INCISION AND DRAINAGE Performed by: Angus Seller Consent: Verbal consent obtained. Risks and benefits: risks, benefits and alternatives were discussed Type: abscess  Body area: Right flank  Anesthesia: local infiltration  Local anesthetic: lidocaine 2% epinephrine  Anesthetic total: 3 ml  Complexity: complex Blunt dissection to break up loculations  Drainage: purulent  Drainage amount: Small   Packing material: None   Patient tolerance: Patient tolerated the procedure well with no immediate complications.     INCISION AND DRAINAGE Performed by: Angus Seller Consent: Verbal consent obtained. Risks and benefits: risks, benefits and alternatives were discussed Type: abscess  Body area: Left flank  Anesthesia: local infiltration  Local anesthetic: lidocaine 2% with epinephrine  Anesthetic total: 3 ml  Complexity: complex Blunt dissection to break up loculations  Drainage: purulent  Drainage  amount: Small   Packing material: None   Patient tolerance: Patient tolerated the procedure well with no immediate complications.         1. Abscess or cellulitis of abdominal wall       MDM  Patient seen and evaluated. I&D is performed with small amounts of drainage. Patient discharged with prescription for Bactrim and Keflex as she reports allergy to Cleocin.        Angus Seller, [place] 05/10/12 2136

## 2012-05-11 NOTE — ED Provider Notes (Signed)
Medical screening examination/treatment/procedure(s) were performed by non-physician practitioner and as supervising physician I was immediately available for consultation/collaboration.    Vida Roller, MD 05/11/12 [phone]

## 2012-05-18 DIAGNOSIS — R5381 Other malaise: Secondary | ICD-10-CM | POA: Diagnosis not present

## 2012-05-18 DIAGNOSIS — E785 Hyperlipidemia, unspecified: Secondary | ICD-10-CM | POA: Diagnosis not present

## 2012-05-18 DIAGNOSIS — L0291 Cutaneous abscess, unspecified: Secondary | ICD-10-CM | POA: Diagnosis not present

## 2012-05-18 DIAGNOSIS — L0231 Cutaneous abscess of buttock: Secondary | ICD-10-CM | POA: Diagnosis not present

## 2012-05-18 DIAGNOSIS — L03317 Cellulitis of buttock: Secondary | ICD-10-CM | POA: Diagnosis not present

## 2012-07-17 DIAGNOSIS — L0293 Carbuncle, unspecified: Secondary | ICD-10-CM | POA: Diagnosis not present

## 2012-07-21 DIAGNOSIS — L0390 Cellulitis, unspecified: Secondary | ICD-10-CM | POA: Diagnosis not present

## 2012-07-21 DIAGNOSIS — L0291 Cutaneous abscess, unspecified: Secondary | ICD-10-CM | POA: Diagnosis not present

## 2012-07-23 DIAGNOSIS — L0291 Cutaneous abscess, unspecified: Secondary | ICD-10-CM | POA: Diagnosis not present

## 2012-07-23 DIAGNOSIS — L0390 Cellulitis, unspecified: Secondary | ICD-10-CM | POA: Diagnosis not present

## 2012-07-30 DIAGNOSIS — I10 Essential (primary) hypertension: Secondary | ICD-10-CM | POA: Diagnosis not present

## 2012-08-10 DIAGNOSIS — L0390 Cellulitis, unspecified: Secondary | ICD-10-CM | POA: Diagnosis not present

## 2012-08-10 DIAGNOSIS — L0291 Cutaneous abscess, unspecified: Secondary | ICD-10-CM | POA: Diagnosis not present

## 2012-08-12 DIAGNOSIS — Z23 Encounter for immunization: Secondary | ICD-10-CM | POA: Diagnosis not present

## 2013-01-10 ENCOUNTER — Encounter (HOSPITAL_COMMUNITY): Payer: Self-pay | Admitting: *Deleted

## 2013-01-10 ENCOUNTER — Emergency Department (HOSPITAL_COMMUNITY)
Admission: EM | Admit: 2013-01-10 | Discharge: 2013-01-10 | Disposition: A | Discharge status: Home / Self Care | Payer: Medicare Other | Attending: Emergency Medicine | Admitting: Emergency Medicine

## 2013-01-10 ENCOUNTER — Emergency Department (HOSPITAL_COMMUNITY): Payer: Medicare Other

## 2013-01-10 DIAGNOSIS — M129 Arthropathy, unspecified: Secondary | ICD-10-CM | POA: Insufficient documentation

## 2013-01-10 DIAGNOSIS — R300 Dysuria: Secondary | ICD-10-CM | POA: Diagnosis not present

## 2013-01-10 DIAGNOSIS — Z79899 Other long term (current) drug therapy: Secondary | ICD-10-CM | POA: Diagnosis not present

## 2013-01-10 DIAGNOSIS — Z8719 Personal history of other diseases of the digestive system: Secondary | ICD-10-CM | POA: Insufficient documentation

## 2013-01-10 DIAGNOSIS — R109 Unspecified abdominal pain: Secondary | ICD-10-CM | POA: Diagnosis not present

## 2013-01-10 DIAGNOSIS — R1013 Epigastric pain: Secondary | ICD-10-CM | POA: Diagnosis not present

## 2013-01-10 LAB — COMPREHENSIVE METABOLIC PANEL
ALT: 19 U/L (ref 0–35)
AST: 21 U/L (ref 0–37)
Alkaline Phosphatase: 63 U/L (ref 39–117)
CO2: 28 mEq/L (ref 19–32)
Calcium: 9.6 mg/dL (ref 8.4–10.5)
Chloride: 105 mEq/L (ref 96–112)
GFR calc Af Amer: >90 mL/min (ref >90)
GFR calc non Af Amer: 84 mL/min — ABNORMAL LOW (ref >90)
Glucose, Bld: 113 mg/dL — ABNORMAL HIGH (ref 70–99)
Potassium: 4 mEq/L (ref 3.5–5.1)
Sodium: 140 mEq/L (ref 135–145)

## 2013-01-10 LAB — URINALYSIS, ROUTINE W REFLEX MICROSCOPIC
Hgb urine dipstick: NEGATIVE
Leukocytes, UA: NEGATIVE
Nitrite: NEGATIVE
Protein, ur: NEGATIVE mg/dL
Specific Gravity, Urine: 1.013 (ref 1.005–1.030)
Urobilinogen, UA: 0.2 mg/dL (ref 0.0–1.0)

## 2013-01-10 LAB — CBC WITH DIFFERENTIAL/PLATELET
Basophils Absolute: 0 10*3/uL (ref 0.0–0.1)
Lymphocytes Relative: 23 % (ref 12–46)
Lymphs Abs: 1.9 10*3/uL (ref 0.7–4.0)
MCV: 88.3 fL (ref 78.0–100.0)
Neutro Abs: 5.8 10*3/uL (ref 1.7–7.7)
Neutrophils Relative %: 70 % (ref 43–77)
Platelets: 215 10*3/uL (ref 150–400)
RBC: 4.12 MIL/uL (ref 3.87–5.11)
RDW: 14.3 % (ref 11.5–15.5)
WBC: 8.2 10*3/uL (ref 4.0–10.5)

## 2013-01-10 MED ORDER — MORPHINE SULFATE 4 MG/ML IJ SOLN
4.0000 mg | Freq: Once | INTRAMUSCULAR | Status: AC
  Administered 2013-01-10: 4 mg via INTRAVENOUS
  Filled 2013-01-10: qty 1

## 2013-01-10 MED ORDER — IOHEXOL 300 MG/ML  SOLN
100.0000 mL | Freq: Once | INTRAMUSCULAR | Status: AC | PRN
  Administered 2013-01-10: 100 mL via INTRAVENOUS

## 2013-01-10 MED ORDER — ONDANSETRON HCL 4 MG PO TABS: 4.0000 mg | ORAL_TABLET | Freq: Four times a day (QID) | ORAL | Status: DC

## 2013-01-10 MED ORDER — IOHEXOL 300 MG/ML  SOLN
50.0000 mL | Freq: Once | INTRAMUSCULAR | Status: AC | PRN
  Administered 2013-01-10: 50 mL via ORAL

## 2013-01-10 MED ORDER — ONDANSETRON HCL 4 MG/2ML IJ SOLN
4.0000 mg | Freq: Once | INTRAMUSCULAR | Status: AC
  Administered 2013-01-10: 4 mg via INTRAVENOUS
  Filled 2013-01-10: qty 2

## 2013-01-10 MED ORDER — HYDROCODONE-ACETAMINOPHEN 5-325 MG PO TABS: 1.0000 | ORAL_TABLET | Freq: Four times a day (QID) | ORAL | Status: DC | PRN

## 2013-01-10 NOTE — ED Provider Notes (Signed)
  Physical Exam  BP 148/81  Pulse 83  Temp(Src) 98 F (36.7 C) (Oral)  Resp 18  SpO2 96%  Physical Exam Patient comfortable, asked to review CT scan, and cystoscopy accordingly ED Course  Procedures  MDM  Reviewed results of CT scan, which does not reveal any bowel irritation, inflammation, or kidney stones.  Patient will be discharged home with a prescription for Norco and Zofran with instructions to follow up with her primary care physician tomorrow     Arman Filter, NP 01/10/13 2218

## 2013-01-10 NOTE — ED Provider Notes (Signed)
History     CSN: [id number]  Arrival date & time 01/10/13  1724   First MD Initiated Contact with Patient 01/10/13 1728      Chief Complaint  Patient presents with  . Abdominal Pain    (Consider location/radiation/quality/duration/timing/severity/associated sxs/prior treatment) HPI Comments: Patient is a 72 y/o F with PMHx of arthritis and SBO presenting to the ED with abdominal pain that started this morning. Patient reported that pain is mainly localized to the epigastric region and RLQ/right inguinal region, described as mild, intermittent gnawing pain that lasts a couple of minutes. Stated that when she urinates she has a burning sensation that has been going on for at least 1 week. Reported that at times there is a sharp shooting pain in the groin when urinating. Patient reported that she has had three bowel movements today - no blood or mucus noted. Patient reported feeling fine yesterday, went out to eat with the family for dinner. Reported not taking anything for the discomfort, reported that pain improves when walking. Denied fever, chills, dysphagia, odynphagia, neck pain, chest pain, shortness of breathe, difficulty breathing, nausea, vomiting, diarrhea, constipation, pelvic pain, leg pain, history of blood clots.   Reviewed patient's charts - patient has history of SBO that occurred in 2012.    The history is provided by the patient. No language interpreter was used.    Past Medical History  Diagnosis Date  . Bowel obstruction   . Arthritis     Past Surgical History  Procedure Laterality Date  . Partial hysterectomy    . Oophorectomy    . Hernia repair      History reviewed. No pertinent family history.  History  Substance Use Topics  . Smoking status: Never Smoker   . Smokeless tobacco: Not on file  . Alcohol Use: No    OB History   Grav Para Term Preterm Abortions TAB SAB Ect Mult Living                  Review of Systems  Constitutional: Negative for  fever and chills.  HENT: Negative for sore throat, trouble swallowing, neck pain and neck stiffness.   Eyes: Negative for photophobia and visual disturbance.  Respiratory: Negative for chest tightness and shortness of breath.   Cardiovascular: Negative for chest pain.  Gastrointestinal: Positive for abdominal pain. Negative for nausea, vomiting, diarrhea, constipation and blood in stool.  Genitourinary: Positive for dysuria. Negative for decreased urine volume and difficulty urinating.  Musculoskeletal: Negative for back pain.  Skin: Negative for rash.  Neurological: Negative for dizziness, weakness, light-headedness, numbness and headaches.  All other systems reviewed and are negative.    Allergies  Review of patient's allergies indicates no known allergies.  Home Medications   Current Outpatient Rx  Name  Route  Sig  Dispense  Refill  . fish oil-omega-3 fatty acids 1000 MG capsule   Oral   Take 1 g by mouth 2 (two) times daily.         . Multiple Vitamin (MULITIVITAMIN WITH MINERALS) TABS   Oral   Take 1 tablet by mouth daily.           Marland Kitchen omeprazole (PRILOSEC OTC) 20 MG tablet   Oral   Take 20 mg by mouth daily.           BP 179/91  Pulse 92  Temp(Src) 98 F (36.7 C) (Oral)  Resp 18  SpO2 100%  Physical Exam  Nursing note and vitals reviewed. Constitutional:  She is oriented to person, place, and time. She appears well-developed and well-nourished. No distress.  HENT:  Head: Normocephalic and atraumatic.  Nose: Nose normal.  Mouth/Throat: Oropharynx is clear and moist. No oropharyngeal exudate.  Eyes: Conjunctivae and EOM are normal. Pupils are equal, round, and reactive to light. Right eye exhibits no discharge. Left eye exhibits no discharge.  Neck: Normal range of motion. Neck supple. No tracheal deviation present. No thyromegaly present.  Negative lymphadenopathy  Cardiovascular: Normal rate, regular rhythm, normal heart sounds and intact distal pulses.   Exam reveals no friction rub.   No murmur heard. Pulmonary/Chest: Effort normal and breath sounds normal. No respiratory distress. She has no wheezes. She has no rales.  Abdominal: Soft. Bowel sounds are normal. She exhibits no distension and no mass. There is tenderness. There is no rebound and no guarding.  Mild tenderness to epigastric region and right inguinal region Negative Murphy's sign Negative Obtruator sign Negative Psoas sign Negative McBurney's point  Lymphadenopathy:    She has no cervical adenopathy.  Neurological: She is alert and oriented to person, place, and time. No cranial nerve deficit. She exhibits normal muscle tone. Coordination normal.  Skin: Skin is warm and dry. No rash noted. No erythema.  Psychiatric: She has a normal mood and affect. Her behavior is normal. Thought content normal.    ED Course  Procedures (including critical care time)  Labs Reviewed - No data to display No results found.  Results for orders placed during the hospital encounter of 01/10/13  URINALYSIS, ROUTINE W REFLEX MICROSCOPIC      Result Value Range   Color, Urine YELLOW  YELLOW   APPearance HAZY (*) CLEAR   Specific Gravity, Urine 1.013  1.005 - 1.030   pH 6.5  5.0 - 8.0   Glucose, UA NEGATIVE  NEGATIVE mg/dL   Hgb urine dipstick NEGATIVE  NEGATIVE   Bilirubin Urine NEGATIVE  NEGATIVE   Ketones, ur NEGATIVE  NEGATIVE mg/dL   Protein, ur NEGATIVE  NEGATIVE mg/dL   Urobilinogen, UA 0.2  0.0 - 1.0 mg/dL   Nitrite NEGATIVE  NEGATIVE   Leukocytes, UA NEGATIVE  NEGATIVE  CBC WITH DIFFERENTIAL      Result Value Range   WBC 8.2  4.0 - 10.5 K/uL   RBC 4.12  3.87 - 5.11 MIL/uL   Hemoglobin 12.2  12.0 - 15.0 g/dL   HCT 40.9  81.1 - 91.4 %   MCV 88.3  78.0 - 100.0 fL   MCH 29.6  26.0 - 34.0 pg   MCHC 33.5  30.0 - 36.0 g/dL   RDW 78.2  95.6 - 21.3 %   Platelets 215  150 - 400 K/uL   Neutrophils Relative 70  43 - 77 %   Neutro Abs 5.8  1.7 - 7.7 K/uL   Lymphocytes Relative 23   12 - 46 %   Lymphs Abs 1.9  0.7 - 4.0 K/uL   Monocytes Relative 5  3 - 12 %   Monocytes Absolute 0.4  0.1 - 1.0 K/uL   Eosinophils Relative 1  0 - 5 %   Eosinophils Absolute 0.1  0.0 - 0.7 K/uL   Basophils Relative 0  0 - 1 %   Basophils Absolute 0.0  0.0 - 0.1 K/uL  COMPREHENSIVE METABOLIC PANEL      Result Value Range   Sodium 140  135 - 145 mEq/L   Potassium 4.0  3.5 - 5.1 mEq/L   Chloride 105  96 -  112 mEq/L   CO2 28  19 - 32 mEq/L   Glucose, Bld 113 (*) 70 - 99 mg/dL   BUN 11  6 - 23 mg/dL   Creatinine, Ser 1.61  0.50 - 1.10 mg/dL   Calcium 9.6  8.4 - 09.6 mg/dL   Total Protein 7.4  6.0 - 8.3 g/dL   Albumin 3.6  3.5 - 5.2 g/dL   AST 21  0 - 37 U/L   ALT 19  0 - 35 U/L   Alkaline Phosphatase 63  39 - 117 U/L   Total Bilirubin 0.2 (*) 0.3 - 1.2 mg/dL   GFR calc non Af Amer 84 (*) >90 mL/min   GFR calc Af Amer >90  >90 mL/min  LIPASE, BLOOD      Result Value Range   Lipase 29  11 - 59 U/L   Dg Abd 2 Views  01/10/2013  *RADIOLOGY REPORT*  Clinical Data: Abdominal pain.  ABDOMEN - 2 VIEW  Comparison: Two-view abdomen 09/27/1011.  Findings: The heart is enlarged.  There is no definite failure.  Supine and upright views the abdomen demonstrate nonspecific bowel gas pattern.  There is no evidence for obstruction or free air.  IMPRESSION:  1.  No acute abnormality the abdomen. 2.  Cardiomegaly without failure.   Original Report Authenticated By: Marin Roberts, M.D.      No diagnosis found.    MDM  I personally evaluated and examined the patient. No sign of acute abdomen or peritoneal irritation. Patient afebrile, normotensive, non-tachycardic, alert and oriented.  Patient denied pain medications. CMP, Lipase, CBC, UA negative findings Abdominal xray negative findings for acute abdominal abnormalities Patient re-evaluated and pain has increased - morphine and zofran IV given for pain relief.  Patient is to get CT abdomen pelvis with contrast to rule out possible  inflammation and kidney stones.  Discussed case with Earley Favor, NP. Transfer of care to Earley Favor at 9:00pm.       Raymon Mutton, PA-C 01/10/13 2147

## 2013-01-10 NOTE — ED Notes (Signed)
Reports intermittent mid abd pain that started today. Denies any n/v/d. Reports hx of bowel obstruction.

## 2013-01-11 NOTE — ED Provider Notes (Signed)
Medical screening examination/treatment/procedure(s) were conducted as a shared visit with non-physician practitioner(s) and myself.  I personally evaluated the patient during the encounter  Patient with some right-sided abdominal tenderness on exam.  CT scan pending at this time.  Pain treated.  History of small bowel obstruction.  Plain films are normal.  Will evaluate further with CT scan.  Lyanne Co, MD 01/11/13 [phone]

## 2013-01-11 NOTE — ED Provider Notes (Signed)
Medical screening examination/treatment/procedure(s) were conducted as a shared visit with non-physician practitioner(s) and myself.  I personally evaluated the patient during the encounter.  Please see my prior note  Lyanne Co, MD 01/11/13 [phone]

## 2013-04-13 ENCOUNTER — Telehealth: Payer: Self-pay | Admitting: Family Medicine

## 2013-04-13 ENCOUNTER — Encounter: Payer: Self-pay | Admitting: Family Medicine

## 2013-04-13 ENCOUNTER — Ambulatory Visit (INDEPENDENT_AMBULATORY_CARE_PROVIDER_SITE_OTHER): Payer: Medicare Other | Admitting: Family Medicine

## 2013-04-13 VITALS — BP 171/98 | HR 89 | Temp 97.2°F | Ht 63.0 in | Wt 195.0 lb

## 2013-04-13 DIAGNOSIS — R609 Edema, unspecified: Secondary | ICD-10-CM | POA: Diagnosis not present

## 2013-04-13 DIAGNOSIS — I10 Essential (primary) hypertension: Secondary | ICD-10-CM | POA: Diagnosis not present

## 2013-04-13 DIAGNOSIS — R42 Dizziness and giddiness: Secondary | ICD-10-CM

## 2013-04-13 DIAGNOSIS — R0602 Shortness of breath: Secondary | ICD-10-CM | POA: Diagnosis not present

## 2013-04-13 LAB — COMPREHENSIVE METABOLIC PANEL
AST: 24 U/L (ref 0–37)
Albumin: 4.2 g/dL (ref 3.5–5.2)
Alkaline Phosphatase: 78 U/L (ref 39–117)
Calcium: 9.9 mg/dL (ref 8.4–10.5)
Chloride: 102 mEq/L (ref 96–112)
Glucose, Bld: 108 mg/dL — ABNORMAL HIGH (ref 70–99)
Potassium: 4.8 mEq/L (ref 3.5–5.3)
Sodium: 139 mEq/L (ref 135–145)
Total Protein: 7.3 g/dL (ref 6.0–8.3)

## 2013-04-13 LAB — POCT CBC
Granulocyte percent: 72.8 %G (ref 37–80)
HCT, POC: 36.5 % — AB (ref 37.7–47.9)
MCV: 87.7 fL (ref 80–97)
POC Granulocyte: 5.5 (ref 2–6.9)
Platelet Count, POC: 229 10*3/uL (ref 142–424)
RBC: 4.2 M/uL (ref 4.04–5.48)
RDW, POC: 14.1 %
WBC: 7.5 10*3/uL (ref 4.6–10.2)

## 2013-04-13 MED ORDER — TRIAMTERENE-HCTZ 37.5-25 MG PO TABS: 1.0000 | ORAL_TABLET | Freq: Every day | ORAL | Status: DC

## 2013-04-13 NOTE — Telephone Encounter (Signed)
APPT MADE

## 2013-04-13 NOTE — Progress Notes (Signed)
HPI:  Patient presents today for followup of edema. Patient is noted to have been treated for hypertension secondary been about one year ago. Patient was started on HCTZ at that time. Patient states she's been on this medicine for over the past 3 months and this had recurrence of her lotion the edema. Patient does report eating salty food including hotdogs and fried chicken. Patient denies any chest pains or shortness of breath. No orthopnea. Patient did have one episode of dizziness earlier today that lasted approximately 15 minutes. No loss of consciousness. Symptoms self resolved. No hemiparesis or confusion associated with incident. No loss of vision.   Patient Active Problem List   Diagnosis Date Noted  . Small bowel obstruction 09/24/2011  . Hypertension 09/24/2011  . Hypokalemia 09/24/2011  . Gastroesophageal reflux disease 09/24/2011   Past Medical History: Past Medical History  Diagnosis Date  . Bowel obstruction   . Arthritis   . GERD (gastroesophageal reflux disease)   . Hypertension     Past Surgical History: Past Surgical History  Procedure Laterality Date  . Partial hysterectomy    . Oophorectomy    . Hernia repair      Social History: History   Social History  . Marital Status: Widowed    Spouse Name: N/A    Number of Children: N/A  . Years of Education: N/A   Social History Main Topics  . Smoking status: Never Smoker   . Smokeless tobacco: Never Used  . Alcohol Use: No  . Drug Use: No  . Sexually Active: None   Other Topics Concern  . None   Social History Narrative  . None    Family History: Family History  Problem Relation Age of Onset  . Heart disease Mother   . Stroke Father   . Hypertension Sister   . Hypertension Brother   . Hypertension Sister   . Hypertension Sister   . Hypertension Brother   . Hypertension Brother     Allergies: No Known Allergies  Current Outpatient Prescriptions  Medication Sig Dispense Refill   . fish oil-omega-3 fatty acids 1000 MG capsule Take 1 g by mouth 2 (two) times daily.      . Multiple Vitamin (MULITIVITAMIN WITH MINERALS) TABS Take 1 tablet by mouth daily.        Marland Kitchen omeprazole (PRILOSEC OTC) 20 MG tablet Take 20 mg by mouth daily.      Marland Kitchen HYDROcodone-acetaminophen (NORCO/VICODIN) 5-325 MG per tablet Take 1 tablet by mouth every 6 (six) hours as needed for pain.  30 tablet  0   No current facility-administered medications for this visit.   Review Of Systems: 12 point ROS negative except as noted above in HPI  Physical Exam: Filed Vitals:   04/13/13 1244  BP: 172/97  Pulse: 98  Temp: 97.2 F (36.2 C)   General: alert HEENT: PERRLA and extra ocular movement intact Heart: S1, S2 normal, no murmur, rub or gallop, regular rate and rhythm Lungs: clear to auscultation Abdomen: abdomen is soft without significant tenderness, masses, organomegaly or guarding Extremities: extremities normal, atraumatic, no cyanosis or edema Skin:no rashes Neurology: normal without focal findings  Labs and Imaging: Lab Results  Component Value Date/Time   NA 140 01/10/2013  6:44 PM   K 4.0 01/10/2013  6:44 PM   CL 105 01/10/2013  6:44 PM   CO2 28 01/10/2013  6:44 PM   BUN 11 01/10/2013  6:44 PM   CREATININE 0.74 01/10/2013  6:44 PM  GLUCOSE 113* 01/10/2013  6:44 PM   Lab Results  Component Value Date   WBC 7.5 04/13/2013   HGB 12.5 04/13/2013   HCT 36.5* 04/13/2013   MCV 87.7 04/13/2013   PLT 215 01/10/2013   EKG: NSR   Assessment and Plan: HTN (hypertension) - Plan: EKG 12-Lead, POCT CBC, Comprehensive metabolic panel, POCT glycosylated hemoglobin (Hb A1C), TSH, LDL Cholesterol, Direct, Pro b natriuretic peptide, triamterene-hydrochlorothiazide (MAXZIDE-25) 37.5-25 MG per tablet  Dizziness - Plan: Glucose (CBG)  Edema - Plan: Pro b natriuretic peptide, Compression stockings Suspect dizziness is likely secondary to symptomatic HTN. Asymptomatic on exam. 1 solitary episode that self resolved  this am.  EKG, orthostatics, CBG, CBC, physical exam all WNL.  Will start pt on maxzide for HTN management.  Start low salt diet.  TED hose for edema.  Risk stratification labs including A1c, TSH, direct ldl Also check pro-BNP (no CHF sxs currently) Discussed that if dizziness worsens to go to ER for further evaluation.  Pt expressed understanding.

## 2013-04-20 NOTE — Progress Notes (Signed)
Pt aware all labs normal

## 2013-04-27 ENCOUNTER — Encounter: Payer: Self-pay | Admitting: Family Medicine

## 2013-04-27 ENCOUNTER — Ambulatory Visit (INDEPENDENT_AMBULATORY_CARE_PROVIDER_SITE_OTHER): Payer: Medicare Other | Admitting: Family Medicine

## 2013-04-27 VITALS — BP 144/92 | HR 95 | Temp 98.1°F | Ht 63.0 in | Wt 190.0 lb

## 2013-04-27 DIAGNOSIS — I10 Essential (primary) hypertension: Secondary | ICD-10-CM

## 2013-04-27 LAB — BASIC METABOLIC PANEL
BUN: 21 mg/dL (ref 6–23)
Chloride: 99 mEq/L (ref 96–112)
Creat: 1.08 mg/dL (ref 0.50–1.10)
Glucose, Bld: 134 mg/dL — ABNORMAL HIGH (ref 70–99)
Potassium: 4.1 mEq/L (ref 3.5–5.3)

## 2013-04-27 NOTE — Progress Notes (Signed)
  Subjective:    Patient ID: Stacey [name], female    DOB: 11-06-1940, 72 y.o.   MRN: [id number]  HPI  Pt here for HTN and edema follow up  Pt was recently started on maxzide in setting of LE edema and elevated BP.  Had one solitary episode of dizziness at home that lasted minutes and self resolved prior to last visit.  No episode since.  Edema and BP much improved with medication.  Trying to eat low salt diet.  No HA, CP, SOB, dizziness  Review of Systems  All other systems reviewed and are negative.       Objective:   Physical Exam  Constitutional: She appears well-developed and well-nourished.  HENT:  Head: Normocephalic and atraumatic.  Eyes: Conjunctivae are normal. Pupils are equal, round, and reactive to light.  Neck: Normal range of motion.  Cardiovascular: Normal rate and regular rhythm.   Pulmonary/Chest: Effort normal and breath sounds normal.  Abdominal: Soft.  Musculoskeletal: Normal range of motion.  Neurological: She is alert.  Skin: Skin is warm.  Trace edema in LEs           Assessment & Plan:  HTN (hypertension) - Plan: Basic Metabolic Panel  Continue with medication regimen.  Check BPs daily.  BP and edema much improved from last pt visit.  COntinue to work on low salt diet.  Discussed general and CV red flags.  Follow up in 3 months.    Marland Kitchen

## 2013-05-06 ENCOUNTER — Ambulatory Visit (INDEPENDENT_AMBULATORY_CARE_PROVIDER_SITE_OTHER): Payer: Medicare Other | Admitting: General Practice

## 2013-05-06 ENCOUNTER — Encounter: Payer: Self-pay | Admitting: General Practice

## 2013-05-06 VITALS — BP 138/90 | HR 89 | Temp 97.8°F | Ht 63.0 in | Wt 191.0 lb

## 2013-05-06 DIAGNOSIS — I10 Essential (primary) hypertension: Secondary | ICD-10-CM

## 2013-05-06 DIAGNOSIS — R002 Palpitations: Secondary | ICD-10-CM

## 2013-05-06 DIAGNOSIS — J322 Chronic ethmoidal sinusitis: Secondary | ICD-10-CM | POA: Diagnosis not present

## 2013-05-06 MED ORDER — AZITHROMYCIN 250 MG PO TABS: ORAL_TABLET | ORAL | Status: DC

## 2013-05-06 NOTE — Patient Instructions (Addendum)

## 2013-05-06 NOTE — Progress Notes (Signed)
  Subjective:    Patient ID: Stacey [name], female    DOB: [birth year], 72 y.o.   MRN: [id number]  HPI Patient presents today for feeling jittery and sinus pressure. She reports being started on new medication for blood pressure on 04/13/13 and that was onset of feeling jittery. She was taking same medication in Oct. 2013, but no side effects or similar symptoms, although she discontinued taking medication shortly after starting. She reports stopping maxzide on Saturday. Reports having two episodes of heart palpitations She reports being seen by a cardiologist 30 years ago for irregular heart rhythm and she denies treatment being given.      Review of Systems  Constitutional: Negative for fever and chills.  HENT: Positive for ear pain, congestion, postnasal drip and sinus pressure. Negative for neck pain and neck stiffness.   Respiratory: Negative for chest tightness and shortness of breath.   Cardiovascular: Negative for chest pain and palpitations.  Genitourinary: Negative for difficulty urinating.  Neurological: Negative for dizziness, weakness and headaches.       Objective:   Physical Exam  Constitutional: She is oriented to person, place, and time. She appears well-developed and well-nourished.  Neck: Normal range of motion. Neck supple. No thyromegaly present.  Cardiovascular: Normal rate, regular rhythm and normal heart sounds.   Pulmonary/Chest: Effort normal and breath sounds normal.  Lymphadenopathy:    She has no cervical adenopathy.  Neurological: She is alert and oriented to person, place, and time.  Skin: Skin is warm and dry.  Psychiatric: She has a normal mood and affect.          Assessment & Plan:  1. Ethmoid sinusitis - azithromycin (ZITHROMAX) 250 MG tablet; Take as directed  Dispense: 6 tablet; Refill: 0 -take medications as pescribed -RTO if symptoms worsen  2. Essential hypertension, benign   3. Heart palpitations - Ambulatory referral to  Cardiology -keep diary and bring to office on next visit -discussed low sodium diet -RTO if symptoms worsen or seek emergency medical attention -Patient verbalized understanding -Coralie Keens, FNP-C

## 2013-05-20 ENCOUNTER — Encounter: Payer: Self-pay | Admitting: General Practice

## 2013-05-20 ENCOUNTER — Ambulatory Visit (INDEPENDENT_AMBULATORY_CARE_PROVIDER_SITE_OTHER): Payer: Medicare Other | Admitting: General Practice

## 2013-05-20 VITALS — BP 147/87 | HR 82 | Temp 98.1°F | Ht 63.0 in | Wt 192.5 lb

## 2013-05-20 DIAGNOSIS — R609 Edema, unspecified: Secondary | ICD-10-CM

## 2013-05-20 DIAGNOSIS — R600 Localized edema: Secondary | ICD-10-CM

## 2013-05-20 DIAGNOSIS — I10 Essential (primary) hypertension: Secondary | ICD-10-CM | POA: Diagnosis not present

## 2013-05-20 MED ORDER — HYDROCHLOROTHIAZIDE 25 MG PO TABS: 25.0000 mg | ORAL_TABLET | Freq: Every day | ORAL | Status: DC

## 2013-05-20 NOTE — Progress Notes (Signed)
  Subjective:    Patient ID: Stacey [name], female    DOB: June 24, 1941, 72 y.o.   MRN: [id number]  HPI Patient presents today for follow up of blood pressure. She reports discontinuing blood pressure medication (Maxzide) several weeks ago due to heart palpitations and once she discontinued, the palpitations stopped. She reports having some swelling in her bilateral lower extremities. Reports taking lasix in the past. Reports blood pressure reading at home ranged 120's-140's/70's-80's. Reports taking HCTZ in the past and tolerated well.     Review of Systems  Unable to perform ROS Constitutional: Negative for fever and chills.  Respiratory: Negative for chest tightness and shortness of breath.   Cardiovascular: Positive for leg swelling. Negative for chest pain and palpitations.       Lower extremities  Genitourinary: Negative for difficulty urinating.  Neurological: Negative for dizziness, weakness and headaches.       Objective:   Physical Exam  Constitutional: She is oriented to person, place, and time. She appears well-developed and well-nourished.  Cardiovascular: Normal rate, regular rhythm, normal heart sounds and intact distal pulses.   +1 non pitting edema noted to bilateral lower extremities  Pulmonary/Chest: Effort normal and breath sounds normal.  Neurological: She is alert and oriented to person, place, and time.  Skin: Skin is warm and dry.  Psychiatric: She has a normal mood and affect.          Assessment & Plan:  1. Hypertension and 2. Lower leg edema - hydrochlorothiazide (HYDRODIURIL) 25 MG tablet; Take 1 tablet (25 mg total) by mouth daily.  Dispense: 30 tablet; Refill: 0 -discussed side effects of medication -discussed possible increase or change of medication pending effectiveness -continue maintaining blood pressure diary and bring to office -RTO in 2 weeks for recheck and sooner if symptoms worsen -Coralie Keens, FNP-C Patient verbalized  understanding

## 2013-05-20 NOTE — Patient Instructions (Signed)
Edema  Edema is an abnormal build-up of fluids in tissues. Because this is partly dependent on gravity (water flows to the lowest place), it is more common in the legs and thighs (lower extremities). It is also common in the looser tissues, like around the eyes. Painless swelling of the feet and ankles is common and increases as a person ages. It may affect both legs and may include the calves or even thighs. When squeezed, the fluid may move out of the affected area and may leave a dent for a few moments.  CAUSES    Prolonged standing or sitting in one place for extended periods of time. Movement helps pump tissue fluid into the veins, and absence of movement prevents this, resulting in edema.   Varicose veins. The valves in the veins do not work as well as they should. This causes fluid to leak into the tissues.   Fluid and salt overload.   Injury, burn, or surgery to the leg, ankle, or foot, may damage veins and allow fluid to leak out.   Sunburn damages vessels. Leaky vessels allow fluid to go out into the sunburned tissues.   Allergies (from insect bites or stings, medications or chemicals) cause swelling by allowing vessels to become leaky.   Protein in the blood helps keep fluid in your vessels. Low protein, as in malnutrition, allows fluid to leak out.   Hormonal changes, including pregnancy and menstruation, cause fluid retention. This fluid may leak out of vessels and cause edema.   Medications that cause fluid retention. Examples are sex hormones, blood pressure medications, steroid treatment, or anti-depressants.   Some illnesses cause edema, especially heart failure, kidney disease, or liver disease.   Surgery that cuts veins or lymph nodes, such as surgery done for the heart or for breast cancer, may result in edema.  DIAGNOSIS   Your caregiver is usually easily able to determine what is causing your swelling (edema) by simply asking what is wrong (getting a history) and examining you (doing  a physical). Sometimes x-rays, EKG (electrocardiogram or heart tracing), and blood work may be done to evaluate for underlying medical illness.  TREATMENT   General treatment includes:   Leg elevation (or elevation of the affected body part).   Restriction of fluid intake.   Prevention of fluid overload.   Compression of the affected body part. Compression with elastic bandages or support stockings squeezes the tissues, preventing fluid from entering and forcing it back into the blood vessels.   Diuretics (also called water pills or fluid pills) pull fluid out of your body in the form of increased urination. These are effective in reducing the swelling, but can have side effects and must be used only under your caregiver's supervision. Diuretics are appropriate only for some types of edema.  The specific treatment can be directed at any underlying causes discovered. Heart, liver, or kidney disease should be treated appropriately.  HOME CARE INSTRUCTIONS    Elevate the legs (or affected body part) above the level of the heart, while lying down.   Avoid sitting or standing still for prolonged periods of time.   Avoid putting anything directly under the knees when lying down, and do not wear constricting clothing or garters on the upper legs.   Exercising the legs causes the fluid to work back into the veins and lymphatic channels. This may help the swelling go down.   The pressure applied by elastic bandages or support stockings can help reduce ankle swelling.     A low-salt diet may help reduce fluid retention and decrease the ankle swelling.   Take any medications exactly as prescribed.  SEEK MEDICAL CARE IF:   Your edema is not responding to recommended treatments.  SEEK IMMEDIATE MEDICAL CARE IF:    You develop shortness of breath or chest pain.   You cannot breathe when you lay down; or if, while lying down, you have to get up and go to the window to get your breath.   You are having increasing  swelling without relief from treatment.   You develop a fever over 102 F (38.9 C).   You develop pain or redness in the areas that are swollen.   Tell your caregiver right away if you have gained 3 lb/1.4 kg in 1 day or 5 lb/2.3 kg in a week.  MAKE SURE YOU:    Understand these instructions.   Will watch your condition.   Will get help right away if you are not doing well or get worse.  Document Released: 09/23/2005 Document Revised: 03/24/2012 Document Reviewed: 05/11/2008  Exit[employer] Patient Information 2014 [employer].

## 2013-05-27 ENCOUNTER — Emergency Department (HOSPITAL_COMMUNITY): Payer: Medicare Other

## 2013-05-27 ENCOUNTER — Observation Stay (HOSPITAL_COMMUNITY)
Admission: EM | Admit: 2013-05-27 | Discharge: 2013-05-29 | Disposition: A | Discharge status: Home / Self Care | Payer: Medicare Other | Attending: Internal Medicine | Admitting: Internal Medicine

## 2013-05-27 ENCOUNTER — Encounter (HOSPITAL_COMMUNITY): Payer: Self-pay | Admitting: *Deleted

## 2013-05-27 DIAGNOSIS — Z79899 Other long term (current) drug therapy: Secondary | ICD-10-CM | POA: Diagnosis not present

## 2013-05-27 DIAGNOSIS — R609 Edema, unspecified: Secondary | ICD-10-CM | POA: Insufficient documentation

## 2013-05-27 DIAGNOSIS — E876 Hypokalemia: Secondary | ICD-10-CM | POA: Diagnosis not present

## 2013-05-27 DIAGNOSIS — R55 Syncope and collapse: Secondary | ICD-10-CM | POA: Diagnosis not present

## 2013-05-27 DIAGNOSIS — R002 Palpitations: Secondary | ICD-10-CM | POA: Diagnosis not present

## 2013-05-27 DIAGNOSIS — R42 Dizziness and giddiness: Secondary | ICD-10-CM | POA: Diagnosis not present

## 2013-05-27 DIAGNOSIS — I10 Essential (primary) hypertension: Secondary | ICD-10-CM | POA: Diagnosis present

## 2013-05-27 DIAGNOSIS — R0789 Other chest pain: Secondary | ICD-10-CM | POA: Diagnosis not present

## 2013-05-27 DIAGNOSIS — J9819 Other pulmonary collapse: Secondary | ICD-10-CM | POA: Diagnosis not present

## 2013-05-27 DIAGNOSIS — K219 Gastro-esophageal reflux disease without esophagitis: Secondary | ICD-10-CM | POA: Insufficient documentation

## 2013-05-27 DIAGNOSIS — R079 Chest pain, unspecified: Secondary | ICD-10-CM | POA: Insufficient documentation

## 2013-05-27 HISTORY — DX: Headache: R51

## 2013-05-27 HISTORY — DX: Other chronic pain: G89.29

## 2013-05-27 HISTORY — DX: Low back pain: M54.5

## 2013-05-27 HISTORY — DX: Low back pain, unspecified: M54.50

## 2013-05-27 LAB — CBC WITH DIFFERENTIAL/PLATELET
Eosinophils Relative: 0 % (ref 0–5)
HCT: 36.4 % (ref 36.0–46.0)
Hemoglobin: 12.2 g/dL (ref 12.0–15.0)
Lymphocytes Relative: 21 % (ref 12–46)
Lymphs Abs: 2.1 10*3/uL (ref 0.7–4.0)
MCV: 88.8 fL (ref 78.0–100.0)
Monocytes Absolute: 0.4 10*3/uL (ref 0.1–1.0)
Neutro Abs: 7.6 10*3/uL (ref 1.7–7.7)
RBC: 4.1 MIL/uL (ref 3.87–5.11)
WBC: 10.1 10*3/uL (ref 4.0–10.5)

## 2013-05-27 LAB — POCT I-STAT TROPONIN I

## 2013-05-27 LAB — BASIC METABOLIC PANEL
CO2: 27 mEq/L (ref 19–32)
Calcium: 9.4 mg/dL (ref 8.4–10.5)
Chloride: 99 mEq/L (ref 96–112)
Creatinine, Ser: 0.87 mg/dL (ref 0.50–1.10)
Glucose, Bld: 113 mg/dL — ABNORMAL HIGH (ref 70–99)

## 2013-05-27 LAB — TROPONIN I
Troponin I: <0.3 ng/mL (ref <0.30)
Troponin I: <0.3 ng/mL (ref <0.30)

## 2013-05-27 LAB — PRO B NATRIURETIC PEPTIDE: Pro B Natriuretic peptide (BNP): 77.3 pg/mL (ref 0–125)

## 2013-05-27 MED ORDER — POTASSIUM CHLORIDE CRYS ER 20 MEQ PO TBCR
40.0000 meq | EXTENDED_RELEASE_TABLET | Freq: Once | ORAL | Status: AC
  Administered 2013-05-27: 40 meq via ORAL
  Filled 2013-05-27: qty 2

## 2013-05-27 MED ORDER — ASPIRIN 81 MG PO CHEW
324.0000 mg | CHEWABLE_TABLET | Freq: Once | ORAL | Status: AC
  Administered 2013-05-27: 324 mg via ORAL
  Filled 2013-05-27: qty 4

## 2013-05-27 MED ORDER — HEPARIN SODIUM (PORCINE) 5000 UNIT/ML IJ SOLN
5000.0000 [IU] | Freq: Three times a day (TID) | INTRAMUSCULAR | Status: DC
  Administered 2013-05-27 – 2013-05-29 (×6): 5000 [IU] via SUBCUTANEOUS
  Filled 2013-05-27 (×9): qty 1

## 2013-05-27 MED ORDER — ACETAMINOPHEN 325 MG PO TABS: 650.0000 mg | ORAL_TABLET | Freq: Four times a day (QID) | ORAL | Status: DC | PRN

## 2013-05-27 MED ORDER — ATORVASTATIN CALCIUM 40 MG PO TABS
40.0000 mg | ORAL_TABLET | Freq: Once | ORAL | Status: DC
  Filled 2013-05-27: qty 1

## 2013-05-27 MED ORDER — NITROGLYCERIN 0.4 MG SL SUBL: 0.4000 mg | SUBLINGUAL_TABLET | SUBLINGUAL | Status: DC | PRN

## 2013-05-27 MED ORDER — SODIUM CHLORIDE 0.9 % IJ SOLN
3.0000 mL | Freq: Two times a day (BID) | INTRAMUSCULAR | Status: DC
  Administered 2013-05-27 – 2013-05-28 (×3): 3 mL via INTRAVENOUS

## 2013-05-27 MED ORDER — ONDANSETRON HCL 4 MG/2ML IJ SOLN: 4.0000 mg | Freq: Four times a day (QID) | INTRAMUSCULAR | Status: DC | PRN

## 2013-05-27 MED ORDER — OMEPRAZOLE MAGNESIUM 20 MG PO TBEC: 20.0000 mg | DELAYED_RELEASE_TABLET | Freq: Every day | ORAL | Status: DC

## 2013-05-27 MED ORDER — PANTOPRAZOLE SODIUM 40 MG PO TBEC
40.0000 mg | DELAYED_RELEASE_TABLET | Freq: Every day | ORAL | Status: DC
  Administered 2013-05-27 – 2013-05-29 (×3): 40 mg via ORAL
  Filled 2013-05-27 (×3): qty 1

## 2013-05-27 MED ORDER — ONDANSETRON HCL 4 MG/2ML IJ SOLN: 4.0000 mg | Freq: Once | INTRAMUSCULAR | Status: DC

## 2013-05-27 MED ORDER — ONDANSETRON HCL 4 MG PO TABS: 4.0000 mg | ORAL_TABLET | Freq: Four times a day (QID) | ORAL | Status: DC | PRN

## 2013-05-27 MED ORDER — ACETAMINOPHEN 650 MG RE SUPP: 650.0000 mg | Freq: Four times a day (QID) | RECTAL | Status: DC | PRN

## 2013-05-27 NOTE — H&P (Signed)
Date: 05/27/2013               Patient Name:  Stacey [name] MRN: [id number]  DOB: 1941-01-29 Age / Sex: 72 y.o., female   PCP: Ernestina Penna, MD         Medical Service: Internal Medicine Teaching Service         Attending Physician: Dr. Margarito Liner    First Contact: Dr. Andrey Campanile Pager: [phone]  Second Contact: Dr. Clyde Lundborg Pager: [phone]       After Hours (After 5p/  First Contact Pager: [phone]  weekends / holidays): Second Contact Pager: [phone]   Chief Complaint: palpitations  History of Present Illness:  The patient is a 72 yo woman, history of HTN, presenting with palpitations.  The patient awoke at 4am on the morning of admission, with symptoms of her "heart beating funny", which lasted for about 30 minutes, and resolved spontaneously.  She notes associated lightheadedness and diaphoresis.  During this time, she experienced chest pressure, described as a mid-sternal pressure sensation, 5/10 in severity, without radiation, with no associated nausea or SOB, occurring at rest, unchanged by walking to the bathroom, and has persisted to the present.  The patient notes a recent sinus infection, treated with azithromycin 7/31, from which she states she is still recovering, with poor PO intake due to low appetite.  No fevers, chills, SOB, nausea/vomiting, diarrhea/constipation.  The patient was recently started on Triampterene-HCTZ 7/8 for LE edema, but, due to episodes of lightheadedness and palpitations, this was changed to just HCTZ on 8/14.  She was referred to Cardiology for her palpitations, and has an appointment 10/8.  She notes that a doctor told her she had "an irregular heart beat" in the past, but doesn't know any specific diagnosis.  She reports undergoing a cardiac stress test 30 years ago, which was normal.  Meds: Current Outpatient Prescriptions  Medication Sig Dispense Refill  . hydrochlorothiazide (HYDRODIURIL) 25 MG tablet Take 1 tablet (25 mg total) by mouth daily.  30  tablet  0  . Multiple Vitamin (MULITIVITAMIN WITH MINERALS) TABS Take 1 tablet by mouth daily.        Marland Kitchen omeprazole (PRILOSEC OTC) 20 MG tablet Take 20 mg by mouth daily as needed (reflux).       . fish oil-omega-3 fatty acids 1000 MG capsule Take 1 g by mouth 2 (two) times daily.        Allergies: Allergies as of 05/27/2013 - Review Complete 05/27/2013  Allergen Reaction Noted  . Clindamycin/lincomycin Mouth swelling 04/27/2013  . Other  04/27/2013  . Phenergan [promethazine hcl] Swelling 05/27/2013   Past Medical History  Diagnosis Date  . Bowel obstruction   . Arthritis   . GERD (gastroesophageal reflux disease)   . Hypertension    Past Surgical History  Procedure Laterality Date  . Partial hysterectomy    . Oophorectomy    . Hernia repair     Family History  Problem Relation Age of Onset  . Heart disease Mother   . Stroke Father   . Hypertension Sister   . Hypertension Brother   . Hypertension Sister   . Hypertension Sister   . Hypertension Brother   . Hypertension Brother    History   Social History  . Marital Status: Widowed    Spouse Name: N/A    Number of Children: N/A  . Years of Education: N/A   Occupational History  . Not on file.   Social History Main Topics  .  Smoking status: Never Smoker   . Smokeless tobacco: Never Used  . Alcohol Use: No  . Drug Use: No  . Sexual Activity: Not on file   Other Topics Concern  . Not on file   Social History Narrative   Lives at home with her son.  Manages her own medications.    Review of Systems: General: no fevers, chills, changes in weight, changes in appetite Skin: no rash HEENT: +recent sinus infection, continued sinus drainage Pulm: no dyspnea, coughing, wheezing CV: no chest pain, shortness of breath Abd: no abdominal pain, nausea/vomiting, diarrhea/constipation GU: no dysuria, hematuria, polyuria Ext: no arthralgias, myalgias Neuro: +bilateral arm "tingling", present for the last 2  months  Physical Exam: Blood pressure 118/70, pulse 80, temperature 97.6 F (36.4 C), temperature source Oral, resp. rate 18, height 5\' 3"  (1.6 m), weight 190 lb (86.183 kg), SpO2 98.00%. General: alert, cooperative, and in no apparent distress HEENT: pupils equal round and reactive to light, vision grossly intact, oropharynx clear and non-erythematous  Neck: supple, no lymphadenopathy, JVD, or carotid bruits Lungs: clear to ascultation bilaterally, normal work of respiration, no wheezes, rales, ronchi Heart: regular rate and rhythm, no murmurs, gallops, or rubs Abdomen: soft, non-tender, non-distended, normal bowel sounds Extremities: 2+ bilateral pitting edema to the level of the thighs Neurologic: alert & oriented X3, cranial nerves II-XII intact, strength grossly intact, sensation intact to light touch  Lab results: Basic Metabolic Panel:  Recent Labs  16/10/96 0855  NA 138  K 3.3*  CL 99  CO2 27  GLUCOSE 113*  BUN 20  CREATININE 0.87  CALCIUM 9.4   CBC:  Recent Labs  05/27/13 0855  WBC 10.1  NEUTROABS 7.6  HGB 12.2  HCT 36.4  MCV 88.8  PLT 229   Cardiac Enzymes: Troponin: 0.02  BNP:  Recent Labs  05/27/13 0855  PROBNP 77.3   Thyroid Function Tests: TSH: 0.91  (04/13/13)  Imaging results:  Dg Chest 2 View  05/27/2013   *RADIOLOGY REPORT*  Clinical Data: Heart palpitations  CHEST - 2 VIEW  Comparison: 10/18/2005  Findings: Mild bibasilar atelectasis.  Negative for heart failure or effusion.  No definite pneumonia.  IMPRESSION: Mild bibasilar atelectasis.   Original Report Authenticated By: Janeece Riggers, M.D.    Other results: EKG: NSR, no ST abnormalities  Assessment & Plan by Problem: The patient is a 72 yo woman, history of HTN, presenting for recurrent palpitations.  # Palpitations - The patient experienced palpitations this morning, with a few similar episodes in the last 6 weeks, and a reported history of an "irregular heart beat".  Differential  includes arrythmia (such as paroxysmal afib ,perhaps exacerbated by hypokalemia, 2/2 diuretic use), vs anxiety.  No evidence for anemia (Hb 12.2) vs hypoglycemia (CBG = 113, on no anti-hyperglycemic meds) vs hyperthyroidism (TSH normal last month) vs infection (afebrile, no leukocytosis). -monitor on telemetry -pt may need outpatient holter monitor; pt already has cardiology appointment for 10/8 -replace potassium -stop HCTZ  # Lightheadedness - Etiology of lightheadedness includes palpitations (see above) vs mild volume depletion (given HCTZ usage and poor PO intake, though orthostatics negative).  No evidence of vertigo (no true dizziness). -stop HCTZ  # Atypical Chest Pressure - pt notes chest pressure at rest, 5/10 severity, unrelieved by rest.  Differential includes GERD (history of same) vs anxiety vs MSK.  Will r/o ACS; pain characteristics are atypical, pt has risk factors of HTN and FH, with a TIMI score of 1 (for age; low risk). -  aspirin, atorvastatin -cycle troponin x3 -tylenol prn for pain  # Hypertension - chronic, stable.  BP well-controlled at present -hold HCTZ  Dispo: Disposition is deferred at this time, awaiting improvement of current medical problems. Anticipated discharge in approximately 1-2 day(s).   The patient does have a current PCP Ernestina Penna, MD) and does not need an [facility] hospital follow-up appointment after discharge.  Signed: Linward Headland, MD 05/27/2013, 11:39 AM

## 2013-05-27 NOTE — ED Notes (Signed)
Pt states she woke up feeling weak (4 am).  She went to sit in living room and began feeling like "her heart wasn't beating right" and she began experiencing tingling in both arms and felt like she had to have a bowel movement.  States have been having tingling in both arms since she began taking HCTZ/triamterine in July.  States her MD took her off the dual med and placed her only on HCTZ.  HR nsr presently.

## 2013-05-27 NOTE — ED Notes (Signed)
Pt made aware of need of urine specimen; pt will let us know when she has to go

## 2013-05-27 NOTE — ED Notes (Signed)
Pt has returned from being out of the department; pt placed back on monitor, continuous pulse oximetry and blood pressure cuff; family at bedside 

## 2013-05-27 NOTE — ED Notes (Signed)
Patient transported to X-ray 

## 2013-05-27 NOTE — ED Notes (Signed)
Pt undressed, in gown, on monitor, continuous pulse oximetry and blood pressure cuff; family at bedside 

## 2013-05-27 NOTE — ED Provider Notes (Signed)
Medical screening examination/treatment/procedure(s) were conducted as a shared visit with non-physician practitioner(s) and myself.  I personally evaluated the patient during the encounter   Will admit for further near-syncope/chest pain workup.   Audree Camel, MD 05/27/13 1236

## 2013-05-27 NOTE — ED Provider Notes (Signed)
CSN: [id number]     Arrival date & time 05/27/13  4098 History     First MD Initiated Contact with Patient 05/27/13 [phone]     Chief Complaint  Patient presents with  . Near Syncope   (Consider location/radiation/quality/duration/timing/severity/associated sxs/prior Treatment) HPI  72 year old female with history of hypertension, GERD, remote hx of bowel obstruction presents for evaluations of lightheadedness.  Patient reports she woke up feeling weak this morning. Patient felt lightheadedness, and having heart arrythmia and chest pressure that lasted for more than an hour. she reports having tingling sensation to both arms for the same duration. States has been urged having bowel movement, had a successful bowel movement but symptoms did not improve. Patient felt that she is not herself, having mild diaphoresis, which concerns her and prompted her to come to the ER. Did report R ear pain for several weeks, but has this pain before "i think it's related to my sinus problem".  No complaints of fever, headache, vision changes, dizziness, chest pain or shortness of breath, abdominal pain, back pain, dysuria, abnormal bleeding, numbness or weakness. She has been eating and drinking as normal. Report that she had bilateral lower extremity swelling for many months, was seen and evaluated by her doctor initially it was started on HCTZ/Triamterine which she took for 2 weeks, but was developing heart palpitation.  Her medication was switched to HCTZ a week ago, which she has been taking as prescribed.  Has cardiac stress test more than 30 yrs ago.    Past Medical History  Diagnosis Date  . Bowel obstruction   . Arthritis   . GERD (gastroesophageal reflux disease)   . Hypertension    Past Surgical History  Procedure Laterality Date  . Partial hysterectomy    . Oophorectomy    . Hernia repair     Family History  Problem Relation Age of Onset  . Heart disease Mother   . Stroke Father   .  Hypertension Sister   . Hypertension Brother   . Hypertension Sister   . Hypertension Sister   . Hypertension Brother   . Hypertension Brother    History  Substance Use Topics  . Smoking status: Never Smoker   . Smokeless tobacco: Never Used  . Alcohol Use: No   OB History   Grav Para Term Preterm Abortions TAB SAB Ect Mult Living                 Review of Systems  All other systems reviewed and are negative.    Allergies  Clindamycin/lincomycin; Other; and Phenergan  Home Medications   Current Outpatient Rx  Name  Route  Sig  Dispense  Refill  . hydrochlorothiazide (HYDRODIURIL) 25 MG tablet   Oral   Take 1 tablet (25 mg total) by mouth daily.   30 tablet   0   . Multiple Vitamin (MULITIVITAMIN WITH MINERALS) TABS   Oral   Take 1 tablet by mouth daily.           Marland Kitchen omeprazole (PRILOSEC OTC) 20 MG tablet   Oral   Take 20 mg by mouth daily as needed (reflux).          . fish oil-omega-3 fatty acids 1000 MG capsule   Oral   Take 1 g by mouth 2 (two) times daily.          BP 141/79  Pulse 78  Temp(Src) 97.6 F (36.4 C) (Oral)  Resp 18  Ht 5\' 3"  (1.6 m)  Wt 190 lb (86.183 kg)  BMI 33.67 kg/m2  SpO2 98% Physical Exam  Nursing note and vitals reviewed. Constitutional: She is oriented to person, place, and time. She appears well-developed and well-nourished. No distress.  Awake, alert, nontoxic appearance  HENT:  Head: Atraumatic.  Right Ear: External ear normal.  Left Ear: External ear normal.  Mouth/Throat: Oropharynx is clear and moist.  Eyes: Conjunctivae and EOM are normal. Pupils are equal, round, and reactive to light. Right eye exhibits no discharge. Left eye exhibits no discharge.  Neck: Neck supple. No JVD present.  Cardiovascular: Normal rate and regular rhythm.  Exam reveals no gallop and no friction rub.   No murmur heard. Pulmonary/Chest: Effort normal. No respiratory distress. She exhibits no tenderness.  Abdominal: Soft. There is  no tenderness. There is no rebound.  Musculoskeletal: She exhibits edema (1+ pedal edema bilaterally.  intact distal pulses.). She exhibits no tenderness.  ROM appears intact, no obvious focal weakness  Neurological: She is alert and oriented to person, place, and time.  Mental status and motor strength appears intact  Skin: No rash noted.  Psychiatric: She has a normal mood and affect.    ED Course   Procedures (including critical care time)   Date: 05/27/2013  Rate: 78  Rhythm: normal sinus rhythm  QRS Axis: normal  Intervals: normal  ST/T Wave abnormalities: normal  Conduction Disutrbances:none  Narrative Interpretation:   Old EKG Reviewed: unchanged    8:51 AM In patient with vague symptoms of lightheadedness and but no vertiginous symptoms. No active chest pain or shortness of breath. However endorsed chest pressure earlier.  No cardiac arrhythmia appreciated on exam. Patient recently had medication changes, hydrochlorothiazide. Unsure if the symptom is related to her medication change. Workup initiated. Care discussed with attending.  10:09 AM Patient with near-syncope and chest tightness. Aspirin and sublingual nitroglycerin given. She has normal orthostatic vital sign. EKG shows no ischemic changes, troponin is normal, labs are reassuring, chest x-ray without acute finding. Plan to have patient admitted for obs due to near-syncope and chest tightness  10:31 AM I have consulted Internal Medicine for unassigned pt, who agrees to see pt in ER and will admits for further care. Pt is aware of plan.  Pt also sts her sxs has improved while in ER.  She is currently hemodynamically stable.    Labs Reviewed  BASIC METABOLIC PANEL - Abnormal; Notable for the following:    Potassium 3.3 (*)    Glucose, Bld 113 (*)    GFR calc non Af Amer 66 (*)    GFR calc Af Amer 76 (*)    All other components within normal limits  CBC WITH DIFFERENTIAL  PRO B NATRIURETIC PEPTIDE  URINALYSIS,  ROUTINE W REFLEX MICROSCOPIC  POCT I-STAT TROPONIN I   Dg Chest 2 View  05/27/2013   *RADIOLOGY REPORT*  Clinical Data: Heart palpitations  CHEST - 2 VIEW  Comparison: 10/18/2005  Findings: Mild bibasilar atelectasis.  Negative for heart failure or effusion.  No definite pneumonia.  IMPRESSION: Mild bibasilar atelectasis.   Original Report Authenticated By: Janeece Riggers, M.D.   1. Near syncope   2. Chest tightness or pressure     MDM  BP 118/70  Pulse 80  Temp(Src) 97.6 F (36.4 C) (Oral)  Resp 18  Ht 5\' 3"  (1.6 m)  Wt 190 lb (86.183 kg)  BMI 33.67 kg/m2  SpO2 98%  I have reviewed nursing notes and vital signs. I personally reviewed the imaging tests  through PACS system  I reviewed available ER/hospitalization records thought the EMR     Fayrene Helper, [place] 05/27/13 1032

## 2013-05-27 NOTE — Progress Notes (Signed)
Utilization review completed.  

## 2013-05-28 DIAGNOSIS — R0789 Other chest pain: Secondary | ICD-10-CM | POA: Diagnosis not present

## 2013-05-28 DIAGNOSIS — I10 Essential (primary) hypertension: Secondary | ICD-10-CM | POA: Diagnosis not present

## 2013-05-28 DIAGNOSIS — R002 Palpitations: Secondary | ICD-10-CM | POA: Diagnosis not present

## 2013-05-28 LAB — CBC WITH DIFFERENTIAL/PLATELET
Eosinophils Relative: 2 % (ref 0–5)
HCT: 34.7 % — ABNORMAL LOW (ref 36.0–46.0)
Hemoglobin: 11.2 g/dL — ABNORMAL LOW (ref 12.0–15.0)
Lymphocytes Relative: 44 % (ref 12–46)
Lymphs Abs: 2.8 10*3/uL (ref 0.7–4.0)
MCH: 29.2 pg (ref 26.0–34.0)
MCV: 90.6 fL (ref 78.0–100.0)
Monocytes Absolute: 0.5 10*3/uL (ref 0.1–1.0)
Monocytes Relative: 7 % (ref 3–12)
RBC: 3.83 MIL/uL — ABNORMAL LOW (ref 3.87–5.11)
WBC: 6.3 10*3/uL (ref 4.0–10.5)

## 2013-05-28 LAB — URINALYSIS, ROUTINE W REFLEX MICROSCOPIC
Glucose, UA: NEGATIVE mg/dL
Hgb urine dipstick: NEGATIVE
Leukocytes, UA: NEGATIVE
Protein, ur: NEGATIVE mg/dL
pH: 5.5 (ref 5.0–8.0)

## 2013-05-28 LAB — BASIC METABOLIC PANEL
CO2: 26 mEq/L (ref 19–32)
Chloride: 102 mEq/L (ref 96–112)
Glucose, Bld: 103 mg/dL — ABNORMAL HIGH (ref 70–99)
Sodium: 138 mEq/L (ref 135–145)

## 2013-05-28 LAB — TROPONIN I: Troponin I: <0.3 ng/mL (ref <0.30)

## 2013-05-28 NOTE — H&P (Signed)
Internal Medicine Attending Admission Note Date: 05/28/2013  Patient name: Stacey [name] Medical record number: [id number] Date of birth: 13-Jul-1941 Age: 72 y.o. Gender: female  I saw and evaluated the patient. I reviewed the resident's note and I agree with the resident's findings and plan as documented in the resident's note, with the following additional comments.  Chief Complaint(s): Presyncope; palpitations; substernal chest pressure  History - key components related to admission: Patient is a 72 year old woman with history of hypertension, GERD, SBO, lower extremity edema, and palpitations admitted with complaint of an episode of presyncope, palpitations, and diaphoresis which lasted for about 30 minutes.  Patient has had intermittent palpitations lasting for a few minutes over the past month.  She denies any history of heart disease.  Physical Exam - key components related to admission:  Filed Vitals:   05/27/13 1300 05/27/13 2225 05/27/13 2242 05/28/13 0653  BP: 121/72 156/85 125/73 126/73  Pulse: 74 82  72  Temp: 97.4 F (36.3 C) 97.3 F (36.3 C)  97.5 F (36.4 C)  TempSrc:  Oral  Oral  Resp: 17 18  18   Height: 5\' 3"  (1.6 m)     Weight: 189 lb 12.8 oz (86.093 kg)     SpO2: 98% 94%  98%   General: Alert, no distress Neck: No bruits Lungs: Clear Heart: Regular; no S3, no S4, no murmurs Abdomen: Bowel sounds present, soft, nontender Extremities: 1+ bilateral pitting ankle edema   Lab results:   Basic Metabolic Panel:  Recent Labs  40/98/11 0855 05/28/13 0228  NA 138 138  K 3.3* 3.8  CL 99 102  CO2 27 26  GLUCOSE 113* 103*  BUN 20 17  CREATININE 0.87 0.78  CALCIUM 9.4 8.6     CBC:  Recent Labs  05/27/13 0855 05/28/13 0228  WBC 10.1 6.3  HGB 12.2 11.2*  HCT 36.4 34.7*  MCV 88.8 90.6  PLT 229 208    Recent Labs  05/27/13 0855 05/28/13 0228  NEUTROABS 7.6 2.8  LYMPHSABS 2.1 2.8  MONOABS 0.4 0.5  EOSABS 0.0 0.2  BASOSABS 0.0 0.0     Cardiac Enzymes:  Recent Labs  05/27/13 1411 05/27/13 2051  TROPONINI <0.30 <0.30    BNP:  Recent Labs  05/27/13 0855  PROBNP 77.3    TSH  Date Value Range Status  04/13/2013 0.910  0.350 - 4.500 uIU/mL Final    Urinalysis    Component Value Date/Time   COLORURINE YELLOW 01/10/2013 2034   APPEARANCEUR HAZY* 01/10/2013 2034   LABSPEC 1.013 01/10/2013 2034   PHURINE 6.5 01/10/2013 2034   GLUCOSEU NEGATIVE 01/10/2013 2034   HGBUR NEGATIVE 01/10/2013 2034   BILIRUBINUR NEGATIVE 01/10/2013 2034   KETONESUR NEGATIVE 01/10/2013 2034   PROTEINUR NEGATIVE 01/10/2013 2034   UROBILINOGEN 0.2 01/10/2013 2034   NITRITE NEGATIVE 01/10/2013 2034   LEUKOCYTESUR NEGATIVE 01/10/2013 2034     Imaging results:  Dg Chest 2 View  05/27/2013   *RADIOLOGY REPORT*  Clinical Data: Heart palpitations  CHEST - 2 VIEW  Comparison: 10/18/2005  Findings: Mild bibasilar atelectasis.  Negative for heart failure or effusion.  No definite pneumonia.  IMPRESSION: Mild bibasilar atelectasis.   Original Report Authenticated By: Janeece Riggers, M.D.    Other results: EKG: Sinus rhythm; normal EKG  Assessment & Plan by Problem:  1.  Episode of palpitations, presyncope, and diaphoresis with associated substernal chest pressure.  Patient has had palpitations over the past month which lasted only a few minutes; the episode which prompted  hospitalization lasted for about 30 minutes.  There is no sign of acute coronary syndrome by enzymes or EKG.  Presentation is concerning for arrhythmia, possibly in the setting of ischemic heart disease.  Plan is monitor; 2-D echocardiogram; cardiology consult for consideration of the event monitor and possible stress study.  2.  Other problems and plans as per the resident physician's note.

## 2013-05-28 NOTE — Progress Notes (Addendum)
Admit date: 05/27/2013 Referring Physician  Internal medicine teaching service, Dr. Manson Passey Primary Physician  same Primary Cardiologist  none Reason for Consultation  palpitations and chest pain  ASSESSMENT: 1. Suspect supraventricular arrhythmia, such as  atrial fibrillation  2. Chest discomfort during palpitations  3. Hypertension  4. Gastroesophageal reflux  PLAN:  1. Continuous ambulatory monitor for 30 days to r/o atrial fibrillation.  2. In absence of elevated cardiac markers and EKG evidence for ischemia, I do not believe an ischemic evaluation is needed. Office will set this up once the patient was discharged  3. 2-D echocardiogram  4. Aspirin 81 mg daily   HPI: 72 year old female awakened to urinate and noticed a sense of irregularity of her heart rhythm. She noted the sensation in her chest. There was mild discomfort as well. She awakened to use the bathroom and noticed that this was happening. She was not short of breath. There was no radiation of discomfort. She did have a similar brief episode in the past. The entire episode lasted less than 30 minutes. Since admission to the hospital no significant abnormalities have been found. She is currently undergoing echocardiography at the bedside.  No history of arrhythmias. She denies heart murmur history of rheumatic heart disease . She denies history of syncope.   PMH:   Past Medical History  Diagnosis Date  . Bowel obstruction   . GERD (gastroesophageal reflux disease)   . Hypertension   . XBJYNWGN(562.1)     "maybe once/wk' (05/27/2013)  . Arthritis     "knees" (05/27/2013)  . Chronic lower back pain     "bulging discs" (05/27/2013)     PSH:   Past Surgical History  Procedure Laterality Date  . Bilateral oophorectomy Bilateral 2000  . Abdominal hysterectomy  1980's  . Appendectomy  1980's  . Inguinal hernia repair  10/09/2003; 2006    Laparotomy with extensive lysis of adhesions,/notes 10/09/2003 (05/27/2013);     Marland Kitchen Colon surgery  10/09/2003    small bowel resection/notes 10/09/2003 (05/27/2013)    Allergies:  Clindamycin/lincomycin; Other; and Phenergan Prior to Admit Meds:   Prescriptions prior to admission  Medication Sig Dispense Refill  . hydrochlorothiazide (HYDRODIURIL) 25 MG tablet Take 1 tablet (25 mg total) by mouth daily.  30 tablet  0  . Multiple Vitamin (MULITIVITAMIN WITH MINERALS) TABS Take 1 tablet by mouth daily.        Marland Kitchen omeprazole (PRILOSEC OTC) 20 MG tablet Take 20 mg by mouth daily as needed (reflux).       . fish oil-omega-3 fatty acids 1000 MG capsule Take 1 g by mouth 2 (two) times daily.       Fam HX:    Family History  Problem Relation Age of Onset  . Heart disease Mother   . Stroke Father   . Hypertension Sister   . Hypertension Brother   . Hypertension Sister   . Hypertension Sister   . Hypertension Brother   . Hypertension Brother    Social HX:    History   Social History  . Marital Status: Widowed    Spouse Name: N/A    Number of Children: N/A  . Years of Education: N/A   Occupational History  . Not on file.   Social History Main Topics  . Smoking status: Former Smoker -- 0.25 packs/day for 25 years    Types: Cigarettes  . Smokeless tobacco: Never Used  . Alcohol Use: No  . Drug Use: No  . Sexual Activity:  No   Other Topics Concern  . Not on file   Social History Narrative   Lives at home with her son.  Manages her own medications.     Review of Systems: No neurological symptoms. No chills, fever, or anorexia. No history of seizures. Denies asthma .   Physical Exam: Blood pressure 126/73, pulse 72, temperature 97.5 F (36.4 C), temperature source Oral, resp. rate 18, height 5\' 3"  (1.6 m), weight 86.093 kg (189 lb 12.8 oz), SpO2 98.00%. Weight change:    Obese  No JVD is noted on neck exam   Chest is clear  Cardiac exam reveals no murmur or gallop  Extremities reveal no edema.  Neurological exam is normal   Labs:   Lab Results   Component Value Date   WBC 6.3 05/28/2013   HGB 11.2* 05/28/2013   HCT 34.7* 05/28/2013   MCV 90.6 05/28/2013   PLT 208 05/28/2013    Recent Labs Lab 05/28/13 0228  NA 138  K 3.8  CL 102  CO2 26  BUN 17  CREATININE 0.78  CALCIUM 8.6  GLUCOSE 103*   Lab Results  Component Value Date   TROPONINI <0.30 05/27/2013     Lab Results  Component Value Date   LDLDIRECT 134* 04/13/2013      Radiology:  Dg Chest 2 View  05/27/2013   *RADIOLOGY REPORT*  Clinical Data: Heart palpitations  CHEST - 2 VIEW  Comparison: 10/18/2005  Findings: Mild bibasilar atelectasis.  Negative for heart failure or effusion.  No definite pneumonia.  IMPRESSION: Mild bibasilar atelectasis.   Original Report Authenticated By: Janeece Riggers, M.D.   EKG:   Normal sinus rhythm with early QRS transition. No evidence of ischemia or prior infarction    Lesleigh Noe 05/28/2013 12:32 PM

## 2013-05-28 NOTE — Progress Notes (Addendum)
Subjective: Ms. Acheampong was seen and examined by me this AM.  She is resting comfortably in bed and her family members are at bedside.  She denies CP/palpitations/dizziness since admission.  She is able to ambulate to the bathroom without problem.   Her appetite is good and she ate breakfast this AM.    Objective: Vital signs in last 24 hours: Filed Vitals:   05/27/13 1300 05/27/13 2225 05/27/13 2242 05/28/13 0653  BP: 121/72 156/85 125/73 126/73  Pulse: 74 82  72  Temp: 97.4 F (36.3 C) 97.3 F (36.3 C)  97.5 F (36.4 C)  TempSrc:  Oral  Oral  Resp: 17 18  18   Height: 5\' 3"  (1.6 m)     Weight: 86.093 kg (189 lb 12.8 oz)     SpO2: 98% 94%  98%   Weight change:   Intake/Output Summary (Last 24 hours) at 05/28/13 1134 Last data filed at 05/28/13 0800  Gross per 24 hour  Intake    360 ml  Output      0 ml  Net    360 ml   General: resting in bed in NAD HEENT: mucous membranes moist Cardiac: RRR, no rubs, murmurs or gallops Pulm: clear to auscultation bilaterally, moving normal volumes of air Abd: soft, nontender, nondistended Ext: warm and well perfused, 1+ B/L lower extremity edema Neuro: alert and oriented X3,   Lab Results: Basic Metabolic Panel:  Recent Labs Lab 05/27/13 0855 05/28/13 0228  NA 138 138  K 3.3* 3.8  CL 99 102  CO2 27 26  GLUCOSE 113* 103*  BUN 20 17  CREATININE 0.87 0.78  CALCIUM 9.4 8.6    CBC:  Recent Labs Lab 05/27/13 0855 05/28/13 0228  WBC 10.1 6.3  NEUTROABS 7.6 2.8  HGB 12.2 11.2*  HCT 36.4 34.7*  MCV 88.8 90.6  PLT 229 208   Cardiac Enzymes:  Recent Labs Lab 05/27/13 1411 05/27/13 2051  TROPONINI <0.30 <0.30   BNP:  Recent Labs Lab 05/27/13 0855  PROBNP 77.3   Studies/Results: Dg Chest 2 View  05/27/2013   *RADIOLOGY REPORT*  Clinical Data: Heart palpitations  CHEST - 2 VIEW  Comparison: 10/18/2005  Findings: Mild bibasilar atelectasis.  Negative for heart failure or effusion.  No definite pneumonia.   IMPRESSION: Mild bibasilar atelectasis.   Original Report Authenticated By: Janeece Riggers, M.D.   Medications: I have reviewed the patient's current medications. Scheduled Meds: . atorvastatin  40 mg Oral Once  . heparin  5,000 Units Subcutaneous Q8H  . ondansetron  4 mg Intravenous Once  . pantoprazole  40 mg Oral Daily  . sodium chloride  3 mL Intravenous Q12H   Continuous Infusions:  PRN Meds:.acetaminophen, acetaminophen, nitroGLYCERIN, ondansetron (ZOFRAN) IV, ondansetron  Assessment/Plan: The patient is a 72 yo woman, history of HTN, presenting for recurrent palpitations.   # Palpitations - The patient experienced palpitations on the morning of admission, with a few similar episodes in the last 6 weeks, and a reported history of an "irregular heart beat". Differential includes arrythmia (such as paroxysmal afib ,perhaps exacerbated by hypokalemia, 2/2 diuretic use), vs anxiety. No evidence for anemia (Hb 12.2) vs hypoglycemia (CBG = 113, on no anti-hyperglycemic meds) vs hyperthyroidism (TSH normal last month) vs infection (afebrile, no leukocytosis).    -monitor on telemetry - no cardiac alarms on telemetry overnight -pt may need outpatient holter monitor; pt has cardiology appointment for 10/8 (referred by her primary for palpitations)  - holding HCTZ  - 2D  echo ordered - consulted cardiology for recommendations regarding stress test and an event monitor; recommendations appreciated  # Lightheadedness - Etiology of lightheadedness includes palpitations (see above) vs mild volume depletion (given HCTZ usage and poor PO intake, though orthostatics negative). No evidence of vertigo (no true dizziness).  - holding HCTZ   # Atypical Chest Pressure - pt notes chest pressure at rest, 5/10 severity, unrelieved by rest. Differential includes GERD (history of same) vs anxiety vs MSK. Will r/o ACS; pain characteristics are atypical, pt has risk factors of HTN and FH, with a TIMI score of 1 (for  age; low risk).  -aspirin, atorvastatin  - troponins negative x3 - tylenol prn for pain   # Hypertension - chronic, stable. BP well-controlled at present  - holding HCTZ  Dispo: Disposition is deferred at this time, awaiting improvement of current medical problems.  Anticipated discharge in approximately 1-2 day(s).   The patient does have a current PCP Ernestina Penna, MD) and does need an [facility] hospital follow-up appointment after discharge.  The patient does not know have transportation limitations that hinder transportation to clinic appointments.  .Services Needed at time of discharge: Y = Yes, Blank = No PT:   OT:   RN:   Equipment:   Other:     LOS: 1 day   Evelena Peat, DO 05/28/2013, 11:34 AM

## 2013-05-28 NOTE — Progress Notes (Signed)
Echocardiogram 2D Echocardiogram has been performed.  Stacey [name] 05/28/2013, 12:51 PM

## 2013-05-29 DIAGNOSIS — I10 Essential (primary) hypertension: Secondary | ICD-10-CM | POA: Diagnosis not present

## 2013-05-29 DIAGNOSIS — R0789 Other chest pain: Secondary | ICD-10-CM | POA: Diagnosis not present

## 2013-05-29 DIAGNOSIS — R002 Palpitations: Secondary | ICD-10-CM | POA: Diagnosis not present

## 2013-05-29 LAB — CBC WITH DIFFERENTIAL/PLATELET
Basophils Absolute: 0 10*3/uL (ref 0.0–0.1)
Eosinophils Absolute: 0.3 10*3/uL (ref 0.0–0.7)
Eosinophils Relative: 4 % (ref 0–5)
HCT: 35.6 % — ABNORMAL LOW (ref 36.0–46.0)
Lymphocytes Relative: 38 % (ref 12–46)
MCH: 29.2 pg (ref 26.0–34.0)
MCHC: 32 g/dL (ref 30.0–36.0)
MCV: 91 fL (ref 78.0–100.0)
Monocytes Absolute: 0.5 10*3/uL (ref 0.1–1.0)
RDW: 14.8 % (ref 11.5–15.5)
WBC: 6.8 10*3/uL (ref 4.0–10.5)

## 2013-05-29 LAB — BASIC METABOLIC PANEL
CO2: 24 mEq/L (ref 19–32)
Calcium: 8.7 mg/dL (ref 8.4–10.5)
Creatinine, Ser: 0.73 mg/dL (ref 0.50–1.10)

## 2013-05-29 MED ORDER — HYDROCHLOROTHIAZIDE 12.5 MG PO CAPS: 12.5000 mg | ORAL_CAPSULE | Freq: Every day | ORAL | Status: DC

## 2013-05-29 MED ORDER — ASPIRIN 81 MG PO TBEC: 81.0000 mg | DELAYED_RELEASE_TABLET | Freq: Every day | ORAL | Status: DC

## 2013-05-29 MED ORDER — ASPIRIN EC 81 MG PO TBEC
81.0000 mg | DELAYED_RELEASE_TABLET | Freq: Every day | ORAL | Status: DC
  Filled 2013-05-29 (×2): qty 1

## 2013-05-29 NOTE — Discharge Summary (Signed)
Patient Name:  Stacey [name]  MRN: [id number]  PCP: Rudi Heap, MD  DOB:  June 17, 1941       Date of Admission:  05/27/2013  Date of Discharge:  05/29/2013      Attending Physician: Dr. Meredith Pel      DISCHARGE DIAGNOSES: Principal Problem:   Palpitations Active Problems:   Hypertension   Hypokalemia   Chest pain   lightheadedness  DISPOSITION AND FOLLOW-UP: Stacey [name] is to follow-up with the listed providers as detailed below, at patient's visiting, please address following issues:  1. Please check BMP for potassium level. 2. Please check Bp and make adjustment of her HCTZ dose.  Follow-up Information   Follow up with Lesleigh Noe, MD. (Please follow up with Dr. Verdis Prime in 2 to 3 week. Please call [phone] to make an appointment wiith Dr. Katrinka Blazing in next week. Since this is weekend, we can not make an follow up appoinment for you. )    Specialty:  Cardiology   Contact information:   [address] [place] [address] [phone]       Follow up with Rudi Heap, MD. (please call Dr. Kathi Der office for hopital follow up appointment in 7 to 10 days.)    Specialty:  [facility]   Contact information:   [address] [place] [address] [phone]          Discharge Orders   Future Appointments Provider Department Dept Phone   06/03/2013 2:30 PM Coralie Keens, [place] [facility] [phone]   07/14/2013 3:00 PM Rollene Rotunda, MD [facility] [place] [phone]   Future Orders Complete By Expires   Diet - low sodium heart healthy  As directed    Increase activity slowly  As directed        DISCHARGE MEDICATIONS:   Medication List    STOP taking these medications       hydrochlorothiazide 25 MG tablet  Commonly known as:  HYDRODIURIL  Replaced by:  hydrochlorothiazide 12.5 MG capsule      TAKE these medications       aspirin 81 MG EC tablet  Take 1 tablet (81 mg total)  by mouth daily.     fish oil-omega-3 fatty acids 1000 MG capsule  Take 1 g by mouth 2 (two) times daily.     hydrochlorothiazide 12.5 MG capsule  Commonly known as:  MICROZIDE  Take 1 capsule (12.5 mg total) by mouth daily.     multivitamin with minerals Tabs tablet  Take 1 tablet by mouth daily.     omeprazole 20 MG tablet  Commonly known as:  PRILOSEC OTC  Take 20 mg by mouth daily as needed (reflux).       CONSULTS: cardiology Treatment Team:  Lesleigh Noe, MD    PROCEDURES PERFORMED:  Dg Chest 2 View  05/27/2013   *RADIOLOGY REPORT*  Clinical Data: Heart palpitations  CHEST - 2 VIEW  Comparison: 10/18/2005  Findings: Mild bibasilar atelectasis.  Negative for heart failure or effusion.  No definite pneumonia.  IMPRESSION: Mild bibasilar atelectasis.   Original Report Authenticated By: Janeece Riggers, M.D.     ADMISSION DATA:  History of Present Illness:   The patient is a 72 yo woman, history of HTN, presenting with palpitations.  The patient awoke at 4am on the morning of admission, with symptoms of her "heart beating funny", which lasted for about 30 minutes, and resolved spontaneously.  She notes associated lightheadedness and diaphoresis.  During this time, she experienced chest pressure, described as a mid-sternal pressure sensation, 5/10 in severity, without radiation, with no associated nausea or SOB, occurring at rest, unchanged by walking to the bathroom, and has persisted to the present.  The patient notes a recent sinus infection, treated with azithromycin 7/31, from which she states she is still recovering, with poor PO intake due to low appetite.  No fevers, chills, SOB, nausea/vomiting, diarrhea/constipation.  The patient was recently started on Triampterene-HCTZ 7/8 for LE edema, but, due to episodes of lightheadedness and palpitations, this was changed to just HCTZ on 8/14.  She was referred to Cardiology for her palpitations, and has an appointment 10/8.  She  notes that a doctor told her she had "an irregular heart beat" in the past, but doesn't know any specific diagnosis.  She reports undergoing a cardiac stress test 30 years ago, which was normal.  Physical Exam: Blood pressure 118/70, pulse 80, temperature 97.6 F (36.4 C), temperature source Oral, resp. rate 18, height 5\' 3"  (1.6 m), weight 190 lb (86.183 kg), SpO2 98.00%. General: alert, cooperative, and in no apparent distress HEENT: pupils equal round and reactive to light, vision grossly intact, oropharynx clear and non-erythematous  Neck: supple, no lymphadenopathy, JVD, or carotid bruits Lungs: clear to ascultation bilaterally, normal work of respiration, no wheezes, rales, ronchi Heart: regular rate and rhythm, no murmurs, gallops, or rubs Abdomen: soft, non-tender, non-distended, normal bowel sounds Extremities: 2+ bilateral pitting edema to the level of the thighs Neurologic: alert & oriented X3, cranial nerves II-XII intact, strength grossly intact, sensation intact to light touch  Lab results: Basic Metabolic Panel:  Recent Labs   05/27/13 0855   NA  138   K  3.3*   CL  99   CO2  27   GLUCOSE  113*   BUN  20   CREATININE  0.87   CALCIUM  9.4    CBC:  Recent Labs   05/27/13 0855   WBC  10.1   NEUTROABS  7.6   HGB  12.2   HCT  36.4   MCV  88.8   PLT  229    Cardiac Enzymes: Troponin: 0.02  BNP:  Recent Labs   05/27/13 0855   PROBNP  77.3    Thyroid Function Tests: TSH: 0.91  (04/13/13)  HOSPITAL COURSE:  # Palpitations - Etiology was not clear. Patient had a few similar episodes in the last 6 weeks. No evidence for anemia (Hb 12.2), hypoglycemia (CBG = 113, on no anti-hyperglycemic meds), hyperthyroidism (TSH normal last month) and infection (afebrile, no leukocytosis). Potential possibility is arrythmia (such as paroxysmal afib, perhaps exacerbated by hypokalemia, 2/2 diuretic use). Patient had negative work up, including ProBMP 77, UA, Trop X 2, A1C  6.0 and TSH 0.910.  Patient was monitored on telemetry in hospital. No any cardiac alarms on tele was found. Her 2D echo showed EF 60 to 65% with grade 2 diastolic dysfunction. She is euvolemic at discharge. Cardiology was consulted, and decided to do continuous ambulatory monitor for 30 days to r/o atrial fibrillation. Heart monitor will be mailed to her home after discharge. Patient will follow up with Dr. Verdis Prime. I could not make follow up appointment for pt since today is weekend. Patient was provided with Dr. Corky Sing office number and encouraged to call for appointment. We will also call to make an appointment in next week. She promised to call her PCP  for a follow up appointment in the early next week. Patient is discharged on half dose of her home HCTZ (12.5 mg daily).   # Lightheadedness - Etiology of lightheadedness includes palpitations (see above) vs mild volume depletion (given HCTZ usage and poor PO intake, though orthostatics negative). No evidence of vertigo (no true dizziness). It resolved at discharge.   # Atypical Chest Pressure - pt notes chest pressure at rest, 5/10 severity, unrelieved by rest on admission. Differential includes GERD (history of same) vs anxiety vs MSK. Pain characteristics are atypical. patient has risk factors of HTN and FH, with a TIMI score of 1 (for age; low risk). Her Trop was negative X 2. Started ASA 81 mg per Card.   # Hypertension - chronic, stable. BP well-controlled in hospital. Will restart half dose of her home HCTZ (12.5 mg daily) .  DISCHARGE DATA: Vital Signs: BP 130/64  Pulse 75  Temp(Src) 97.6 F (36.4 C) (Oral)  Resp 18  Ht 5\' 3"  (1.6 m)  Wt 189 lb 12.8 oz (86.093 kg)  BMI 33.63 kg/m2  SpO2 95%  Labs: No results found for this or any previous visit (from the past 24 hour(s)).   Services Ordered on Discharge: Y = Yes; Blank = No PT:   OT:   RN:   Equipment:   Other:      Time Spent on Discharge: 35  min   Signed: Lorretta Harp, MD PGY 2, Internal Medicine Resident 05/30/2013, 4:52 PM

## 2013-05-29 NOTE — Progress Notes (Signed)
Subjective:  -Patient feels better. No new episode of palpitation.  -She denies CP/dizziness since admission.    Objective: Vital signs in last 24 hours: Filed Vitals:   05/28/13 0653 05/28/13 1330 05/28/13 2100 05/29/13 0500  BP: 126/73 139/68 125/76 130/64  Pulse: 72 87 72 75  Temp: 97.5 F (36.4 C) 97.6 F (36.4 C) 99.2 F (37.3 C) 97.6 F (36.4 C)  TempSrc: Oral     Resp: 18 17 18 18   Height:      Weight:      SpO2: 98% 96% 95% 95%   Weight change:   Intake/Output Summary (Last 24 hours) at 05/29/13 1114 Last data filed at 05/29/13 0830  Gross per 24 hour  Intake    840 ml  Output      0 ml  Net    840 ml   General: resting in bed in NAD HEENT: mucous membranes moist Cardiac: RRR, no rubs, murmurs or gallops Pulm: clear to auscultation bilaterally, moving normal volumes of air Abd: soft, nontender, nondistended Ext: warm and well perfused, 1+ B/L lower extremity edema Neuro: alert and oriented X3,   Lab Results: Basic Metabolic Panel:  Recent Labs Lab 05/28/13 0228 05/29/13 0400  NA 138 138  K 3.8 3.5  CL 102 102  CO2 26 24  GLUCOSE 103* 97  BUN 17 20  CREATININE 0.78 0.73  CALCIUM 8.6 8.7    CBC:  Recent Labs Lab 05/28/13 0228 05/29/13 0400  WBC 6.3 6.8  NEUTROABS 2.8 3.4  HGB 11.2* 11.4*  HCT 34.7* 35.6*  MCV 90.6 91.0  PLT 208 209   Cardiac Enzymes:  Recent Labs Lab 05/27/13 1411 05/27/13 2051  TROPONINI <0.30 <0.30   BNP:  Recent Labs Lab 05/27/13 0855  PROBNP 77.3   Studies/Results: No results found. Medications: I have reviewed the patient's current medications. Scheduled Meds: . aspirin EC  81 mg Oral Daily  . atorvastatin  40 mg Oral Once  . heparin  5,000 Units Subcutaneous Q8H  . ondansetron  4 mg Intravenous Once  . pantoprazole  40 mg Oral Daily  . sodium chloride  3 mL Intravenous Q12H   Continuous Infusions:  PRN Meds:.acetaminophen, acetaminophen, nitroGLYCERIN, ondansetron (ZOFRAN) IV,  ondansetron  Assessment/Plan:  # Palpitations - Etiology is not clear. Patient had a few similar episodes in the last 6 weeks. No evidence for anemia (Hb 12.2), hypoglycemia (CBG = 113, on no anti-hyperglycemic meds), hyperthyroidism (TSH normal last month) and  infection (afebrile, no leukocytosis). Potential possibility is arrythmia (such as paroxysmal afib, perhaps exacerbated by hypokalemia, 2/2 diuretic use). Patient had negative work up, including ProBMP 77, UA, Trop X 2, A1C 6.0 and TSH 0.910.  Patient was monitored on telemetry in hospital. No any cardiac alarms on tele. Her 2D echo showed EF 60 to 65% with grade 2 diastolic dysfunction. Her is euvolemic. Cardiology was consulted, and decided to do continuous ambulatory monitor for 30 days to r/o atrial fibrillation. Heart monitor will be mailed to her home after discharge. Patient will be discharged today and follow up with Dr. Verdis Prime. I could not make follow up appointment for pt since today is weekend. Patient was provided with Dr. Corky Sing office number and encouraged to call for appointment. We will also call to make an appointment in next week. She promised to call her PCP for a follow up appointment in the early next week. Patient is discharged on half dose of her home HCTZ (12.5 mg daily).   #  Lightheadedness - Etiology of lightheadedness includes palpitations (see above) vs mild volume depletion (given HCTZ usage and poor PO intake, though orthostatics negative). No evidence of vertigo (no true dizziness). It resolved at discharge.   # Atypical Chest Pressure - pt notes chest pressure at rest, 5/10 severity, unrelieved by rest. Differential includes GERD (history of same) vs anxiety vs MSK. Pain characteristics are atypical. patient has risk factors of HTN and FH, with a TIMI score of 1 (for age; low risk). Her Trop was negative X 2. Started ASA 81 mg per Card.   # Hypertension - chronic, stable. BP well-controlled in hospital.  Will restart half dose of her home HCTZ (12.5 mg daily) .  Dispo: d/c home  The patient does have a current PCP Ernestina Penna, MD) and does need an [facility] hospital follow-up appointment after discharge.  The patient does not know have transportation limitations that hinder transportation to clinic appointments.  .Services Needed at time of discharge: Y = Yes, Blank = No PT:   OT:   RN:   Equipment:   Other:     LOS: 2 days   Lorretta Harp, MD 05/29/2013, 11:14 AM

## 2013-05-29 NOTE — Progress Notes (Signed)
SUBJECTIVE:  No further palpitations or chest pain  OBJECTIVE:   Vitals:   Filed Vitals:   05/28/13 0653 05/28/13 1330 05/28/13 2100 05/29/13 0500  BP: 126/73 139/68 125/76 130/64  Pulse: 72 87 72 75  Temp: 97.5 F (36.4 C) 97.6 F (36.4 C) 99.2 F (37.3 C) 97.6 F (36.4 C)  TempSrc: Oral     Resp: 18 17 18 18   Height:      Weight:      SpO2: 98% 96% 95% 95%   I&O's:   Intake/Output Summary (Last 24 hours) at 05/29/13 0849 Last data filed at 05/29/13 0830  Gross per 24 hour  Intake    840 ml  Output      0 ml  Net    840 ml   TELEMETRY: Reviewed telemetry pt in NSR     PHYSICAL EXAM General: Well developed, well nourished, in no acute distress Head: Eyes PERRLA, No xanthomas.   Normal cephalic and atramatic  Lungs:   Clear bilaterally to auscultation and percussion. Heart:   HRRR S1 S2 Pulses are 2+ & equal. Abdomen: Bowel sounds are positive, abdomen soft and non-tender without masses  Extremities:   No clubbing, cyanosis or edema.  DP +1 Neuro: Alert and oriented X 3. Psych:  Good affect, responds appropriately   LABS: Basic Metabolic Panel:  Recent Labs  81/19/14 0228 05/29/13 0400  NA 138 138  K 3.8 3.5  CL 102 102  CO2 26 24  GLUCOSE 103* 97  BUN 17 20  CREATININE 0.78 0.73  CALCIUM 8.6 8.7   Liver Function Tests: No results found for this basename: AST, ALT, ALKPHOS, BILITOT, PROT, ALBUMIN,  in the last 72 hours No results found for this basename: LIPASE, AMYLASE,  in the last 72 hours CBC:  Recent Labs  05/28/13 0228 05/29/13 0400  WBC 6.3 6.8  NEUTROABS 2.8 3.4  HGB 11.2* 11.4*  HCT 34.7* 35.6*  MCV 90.6 91.0  PLT 208 209   Cardiac Enzymes:  Recent Labs  05/27/13 1411 05/27/13 2051  TROPONINI <0.30 <0.30     RADIOLOGY: Dg Chest 2 View  05/27/2013   *RADIOLOGY REPORT*  Clinical Data: Heart palpitations  CHEST - 2 VIEW  Comparison: 10/18/2005  Findings: Mild bibasilar atelectasis.  Negative for heart failure or effusion.  No  definite pneumonia.  IMPRESSION: Mild bibasilar atelectasis.   Original Report Authenticated By: Janeece Riggers, M.D.   ASSESSMENT:  1. Suspect supraventricular arrhythmia, such as atrial fibrillation  2. Chest discomfort during palpitations - 2D echo with normal LVF and grade II diastolic dysfunction 3. Hypertension  4. Gastroesophageal reflux  PLAN:  1. Continuous ambulatory monitor for 30 days to r/o atrial fibrillation - our office has already contacted patient and heart monitor will be sent to her home 2. In absence of elevated cardiac markers and EKG evidence for ischemia, I do not believe an ischemic evaluation is needed.  Please have patient followup with Dr. Katrinka Blazing in the next 2-3 weeks 4. Recommend Aspirin 81 mg daily     Quintella Reichert, MD  05/29/2013  8:49 AM

## 2013-05-31 ENCOUNTER — Telehealth: Payer: Self-pay | Admitting: Internal Medicine

## 2013-05-31 NOTE — Telephone Encounter (Signed)
I called and spoke with [name] via telephone on 05/31/13 at 3:45PM.  I informed her that I have scheduled a Cardiology appointment for her at [facility] with Dr. Katrinka Blazing on Tuesday, June 15, 2013 at 10:30AM.

## 2013-06-03 ENCOUNTER — Ambulatory Visit: Payer: Medicare Other | Admitting: General Practice

## 2013-06-10 ENCOUNTER — Encounter (HOSPITAL_COMMUNITY): Payer: Self-pay | Admitting: *Deleted

## 2013-06-10 ENCOUNTER — Emergency Department (HOSPITAL_COMMUNITY)
Admission: EM | Admit: 2013-06-10 | Discharge: 2013-06-10 | Disposition: A | Discharge status: Home / Self Care | Payer: Medicare Other | Attending: Emergency Medicine | Admitting: Emergency Medicine

## 2013-06-10 ENCOUNTER — Emergency Department (HOSPITAL_COMMUNITY): Payer: Medicare Other

## 2013-06-10 DIAGNOSIS — M1710 Unilateral primary osteoarthritis, unspecified knee: Secondary | ICD-10-CM | POA: Diagnosis not present

## 2013-06-10 DIAGNOSIS — M545 Low back pain, unspecified: Secondary | ICD-10-CM | POA: Diagnosis not present

## 2013-06-10 DIAGNOSIS — E876 Hypokalemia: Secondary | ICD-10-CM | POA: Insufficient documentation

## 2013-06-10 DIAGNOSIS — R5381 Other malaise: Secondary | ICD-10-CM | POA: Insufficient documentation

## 2013-06-10 DIAGNOSIS — Z87891 Personal history of nicotine dependence: Secondary | ICD-10-CM | POA: Diagnosis not present

## 2013-06-10 DIAGNOSIS — I10 Essential (primary) hypertension: Secondary | ICD-10-CM | POA: Diagnosis not present

## 2013-06-10 DIAGNOSIS — K219 Gastro-esophageal reflux disease without esophagitis: Secondary | ICD-10-CM | POA: Insufficient documentation

## 2013-06-10 DIAGNOSIS — Z79899 Other long term (current) drug therapy: Secondary | ICD-10-CM | POA: Insufficient documentation

## 2013-06-10 DIAGNOSIS — G8929 Other chronic pain: Secondary | ICD-10-CM | POA: Diagnosis not present

## 2013-06-10 DIAGNOSIS — R002 Palpitations: Secondary | ICD-10-CM | POA: Diagnosis not present

## 2013-06-10 LAB — BASIC METABOLIC PANEL
BUN: 14 mg/dL (ref 6–23)
Creatinine, Ser: 0.78 mg/dL (ref 0.50–1.10)
GFR calc Af Amer: >90 mL/min (ref >90)
GFR calc non Af Amer: 83 mL/min — ABNORMAL LOW (ref >90)
Potassium: 3.3 mEq/L — ABNORMAL LOW (ref 3.5–5.1)

## 2013-06-10 LAB — POCT I-STAT TROPONIN I: Troponin i, poc: 0.01 ng/mL (ref 0.00–0.08)

## 2013-06-10 LAB — URINALYSIS, ROUTINE W REFLEX MICROSCOPIC
Bilirubin Urine: NEGATIVE
Hgb urine dipstick: NEGATIVE
Protein, ur: NEGATIVE mg/dL
Urobilinogen, UA: 0.2 mg/dL (ref 0.0–1.0)

## 2013-06-10 LAB — CBC
HCT: 35.3 % — ABNORMAL LOW (ref 36.0–46.0)
MCHC: 34 g/dL (ref 30.0–36.0)
MCV: 89.6 fL (ref 78.0–100.0)
RDW: 14.3 % (ref 11.5–15.5)

## 2013-06-10 MED ORDER — POTASSIUM CHLORIDE CRYS ER 20 MEQ PO TBCR
40.0000 meq | EXTENDED_RELEASE_TABLET | Freq: Once | ORAL | Status: AC
  Administered 2013-06-10: 40 meq via ORAL
  Filled 2013-06-10: qty 2

## 2013-06-10 NOTE — ED Provider Notes (Signed)
CSN: [id number]     Arrival date & time 06/10/13  4098 History   First MD Initiated Contact with Patient 06/10/13 [phone]     Chief Complaint  Patient presents with  . Anxiety  . Fatigue   (Consider location/radiation/quality/duration/timing/severity/associated sxs/prior Treatment) HPI Comments: Patient presents with episode which she thinks are related to her "nerves". Patient states every morning for the past couple weeks she wakes up and has a 30 minute episode of a jitteriness that watches over her. She denies any chest pain, shortness of breath, lightheadedness, dizziness at this time. There are no instigating factors. During episode there are no relieving or exacerbating factors. She says they resolve on their own after about 30 minutes. Of note, she was in the hospital recently for possible palpitations. No arrhythmias were seen. She was discharged home with an appointment for him to ambulatory heart monitoring. This monitor is supposed to be delivered to her house today. She denies any weakness, difficulty with ambulation, nausea, vomiting, diarrhea. She denies any recent illness.  Patient is a 72 y.o. female presenting with anxiety. The history is provided by the patient.  Anxiety This is a recurrent problem. The current episode started 3 to 5 hours ago. The problem occurs daily. The problem has been resolved. Pertinent negatives include no chest pain, no abdominal pain, no headaches and no shortness of breath. Nothing aggravates the symptoms. Nothing relieves the symptoms. She has tried nothing for the symptoms. The treatment provided no relief.    Past Medical History  Diagnosis Date  . Bowel obstruction   . GERD (gastroesophageal reflux disease)   . Hypertension   . YNWGNFAO(130.8)     "maybe once/wk' (05/27/2013)  . Arthritis     "knees" (05/27/2013)  . Chronic lower back pain     "bulging discs" (05/27/2013)   Past Surgical History  Procedure Laterality Date  . Bilateral  oophorectomy Bilateral 2000  . Abdominal hysterectomy  1980's  . Appendectomy  1980's  . Inguinal hernia repair  10/09/2003; 2006    Laparotomy with extensive lysis of adhesions,/notes 10/09/2003 (05/27/2013);   Marland Kitchen Colon surgery  10/09/2003    small bowel resection/notes 10/09/2003 (05/27/2013)   Family History  Problem Relation Age of Onset  . Heart disease Mother   . Stroke Father   . Hypertension Sister   . Hypertension Brother   . Hypertension Sister   . Hypertension Sister   . Hypertension Brother   . Hypertension Brother    History  Substance Use Topics  . Smoking status: Former Smoker -- 0.25 packs/day for 25 years    Types: Cigarettes  . Smokeless tobacco: Never Used  . Alcohol Use: No   OB History   Grav Para Term Preterm Abortions TAB SAB Ect Mult Living                 Review of Systems  Constitutional: Negative for fever.  Respiratory: Negative for shortness of breath.   Cardiovascular: Negative for chest pain.  Gastrointestinal: Negative for abdominal pain.  Neurological: Negative for headaches.  All other systems reviewed and are negative.    Allergies  Clindamycin/lincomycin; Other; and Phenergan  Home Medications   Current Outpatient Rx  Name  Route  Sig  Dispense  Refill  . aspirin EC 81 MG EC tablet   Oral   Take 1 tablet (81 mg total) by mouth daily.   30 tablet   1   . fish oil-omega-3 fatty acids 1000 MG capsule  Oral   Take 1 g by mouth 2 (two) times daily.         . hydrochlorothiazide (MICROZIDE) 12.5 MG capsule   Oral   Take 1 capsule (12.5 mg total) by mouth daily.   30 capsule   1   . Multiple Vitamin (MULITIVITAMIN WITH MINERALS) TABS   Oral   Take 1 tablet by mouth daily.           Marland Kitchen omeprazole (PRILOSEC OTC) 20 MG tablet   Oral   Take 20 mg by mouth daily as needed (reflux).           BP 119/78  Pulse 74  Temp(Src) 98.6 F (37 C) (Oral)  Resp 18  SpO2 96% Physical Exam  Nursing note and vitals  reviewed. Constitutional: She is oriented to person, place, and time. She appears well-developed and well-nourished. No distress.  HENT:  Head: Normocephalic and atraumatic.  Eyes: EOM are normal. Pupils are equal, round, and reactive to light.  Neck: Normal range of motion. Neck supple.  Cardiovascular: Normal rate and regular rhythm.  Exam reveals no friction rub.   No murmur heard. Pulmonary/Chest: Effort normal and breath sounds normal. No respiratory distress. She has no wheezes. She has no rales.  Abdominal: Soft. She exhibits no distension. There is no tenderness. There is no rebound.  Musculoskeletal: Normal range of motion. She exhibits no edema.  Neurological: She is alert and oriented to person, place, and time. No cranial nerve deficit. She exhibits normal muscle tone. Coordination normal.  Skin: She is not diaphoretic.    ED Course  Procedures (including critical care time) Labs Review Labs Reviewed  CBC - Abnormal; Notable for the following:    HCT 35.3 (*)    All other components within normal limits  BASIC METABOLIC PANEL - Abnormal; Notable for the following:    Potassium 3.3 (*)    Glucose, Bld 122 (*)    GFR calc non Af Amer 83 (*)    All other components within normal limits  LACTIC ACID, PLASMA  URINALYSIS, ROUTINE W REFLEX MICROSCOPIC  POCT I-STAT TROPONIN I  POCT I-STAT TROPONIN I   Imaging Review Dg Chest 2 View  06/10/2013   *RADIOLOGY REPORT*  Clinical Data: Anxiety and fatigue  CHEST - 2 VIEW  Comparison: May 27, 2013  Findings: There is a 1 x 1 cm nodular opacity in the inferior anterior mediastinum seen only on the lateral view.  Elsewhere, the lungs are clear with the exception of minimal bibasilar scarring. Heart size and pulmonary vascularity are normal.  No adenopathy. There is mild degenerative change in the thoracic spine.  IMPRESSION: 1 x 1 cm nodular opacity in the inferior anterior mediastinum seen only on the lateral view.  This finding  warrants a noncontrast enhanced chest CT to further evaluate.  There is no edema or consolidation.   Original Report Authenticated By: Bretta Bang, M.D.     Date: 06/10/2013  Rate: 88  Rhythm: normal sinus rhythm  QRS Axis: normal  Intervals: QTc 475  ST/T Wave abnormalities: normal  Conduction Disutrbances:none  Narrative Interpretation:   Old EKG Reviewed: unchanged   MDM   1. Palpitations   2. Hypokalemia    72 year old female here with episodes concerning for possible anxiety versus palpitations. Recent workup with cardiac monitoring without any arrhythmias. Patient well-appearing here. Denies any symptoms at this time. These episodes she's having some benign without any syncope, near-syncope, with chest pain, shortness of breath, dizziness. She  is giving her heart monitor delivered her today and has followup with cardiology in 5 days. She is not started or stopped any new medications. Will do labs. EKG here is normal. Mild hypokalemia - replaced orally. Delta trop normal. Other labs normal. Patient has f/u with Cards and an event monitor awaiting her at home. She is stable for discharge and can f/u as an outpatient. She stated several times, "this feels like my nerves" upon being informed all her labs are normal. She is amenable to discharge.  I have reviewed all labs and imaging and considered them in my medical decision making.   Dagmar Hait, MD 06/10/13 1535

## 2013-06-10 NOTE — ED Notes (Signed)
Patient states she has been having symptoms of anxiety, states she feels jittery, weakness, a strange feeling "washing over" her, patient states could be stress related

## 2013-06-14 ENCOUNTER — Encounter: Payer: Self-pay | Admitting: General Practice

## 2013-06-14 ENCOUNTER — Ambulatory Visit (INDEPENDENT_AMBULATORY_CARE_PROVIDER_SITE_OTHER): Payer: Medicare Other | Admitting: General Practice

## 2013-06-14 VITALS — BP 132/83 | HR 98 | Temp 98.0°F | Ht 63.0 in | Wt 188.5 lb

## 2013-06-14 DIAGNOSIS — E876 Hypokalemia: Secondary | ICD-10-CM | POA: Diagnosis not present

## 2013-06-14 DIAGNOSIS — Z09 Encounter for follow-up examination after completed treatment for conditions other than malignant neoplasm: Secondary | ICD-10-CM

## 2013-06-14 NOTE — Patient Instructions (Signed)
Palpitations  A palpitation is the feeling that your heartbeat is irregular or is faster than normal. It may feel like your heart is fluttering or skipping a beat. Palpitations are usually not a serious problem. However, in some cases, you may need further medical evaluation. CAUSES  Palpitations can be caused by:  Smoking.  Caffeine or other stimulants, such as diet pills or energy drinks.  Alcohol.  Stress and anxiety.  Strenuous physical activity.  Fatigue.  Certain medicines.  Heart disease, especially if you have a history of arrhythmias. This includes atrial fibrillation, atrial flutter, or supraventricular tachycardia.  An improperly working pacemaker or defibrillator. DIAGNOSIS  To find the cause of your palpitations, your caregiver will take your history and perform a physical exam. Tests may also be done, including:  Electrocardiography (ECG). This test records the heart's electrical activity.  Cardiac monitoring. This allows your caregiver to monitor your heart rate and rhythm in real time.  Holter monitor. This is a portable device that records your heartbeat and can help diagnose heart arrhythmias. It allows your caregiver to track your heart activity for several days, if needed.  Stress tests by exercise or by giving medicine that makes the heart beat faster. TREATMENT  Treatment of palpitations depends on the cause of your symptoms and can vary greatly. Most cases of palpitations do not require any treatment other than time, relaxation, and monitoring your symptoms. Other causes, such as atrial fibrillation, atrial flutter, or supraventricular tachycardia, usually require further treatment. HOME CARE INSTRUCTIONS   Avoid:  Caffeinated coffee, tea, soft drinks, diet pills, and energy drinks.  Chocolate.  Alcohol.  Stop smoking if you smoke.  Reduce your stress and anxiety. Things that can help you relax include:  A method that measures bodily functions so  you can learn to control them (biofeedback).  Yoga.  Meditation.  Physical activity such as swimming, jogging, or walking.  Get plenty of rest and sleep. SEEK MEDICAL CARE IF:   You continue to have a fast or irregular heartbeat beyond 24 hours.  Your palpitations occur more often. SEEK IMMEDIATE MEDICAL CARE IF:  You develop chest pain or shortness of breath.  You have a severe headache.  You feel dizzy, or you faint. MAKE SURE YOU:  Understand these instructions.  Will watch your condition.  Will get help right away if you are not doing well or get worse. Document Released: 09/20/2000 Document Revised: 03/24/2012 Document Reviewed: 11/22/2011 [employer] Patient Information 2014 [employer].  

## 2013-06-14 NOTE — Progress Notes (Signed)
  Subjective:    Patient ID: Stacey [name], female    DOB: 09-13-1941, 72 y.o.   MRN: [id number]  HPI Patient presents today for hospital follow up. She was seen in the emergency room for low potassium. She was treated in the emergency room and released. She denies any similar feeling that lead her to the emergency room on 06/10/2013, which were heart palpitations. She denies feeling depressed and moods are well controlled. She reports being a little anxious, but feels it is related to upcoming appointments. She is scheduled to see the cardiologist tomorrow.  Reports she is unable to relax and sleep at night and this has been present since August 2014.    Review of Systems  Constitutional: Negative for fever and chills.  Respiratory: Negative for chest tightness and shortness of breath.   Cardiovascular: Negative for chest pain and palpitations.  Genitourinary: Negative for difficulty urinating.  Neurological: Negative for dizziness, weakness and headaches.       Objective:   Physical Exam  Constitutional: She is oriented to person, place, and time. She appears well-developed and well-nourished.  Cardiovascular: Normal rate, regular rhythm and normal heart sounds.   Pulmonary/Chest: Effort normal and breath sounds normal.  Neurological: She is alert and oriented to person, place, and time.  Skin: Skin is warm and dry.  Psychiatric: She has a normal mood and affect.          Assessment & Plan:  1. Hospital discharge follow-up -maintain scheduled appointments -RTO prn -sleep hygiene discussed -Patient verbalized understanding -Coralie Keens, FNP-C

## 2013-06-15 DIAGNOSIS — R0789 Other chest pain: Secondary | ICD-10-CM | POA: Diagnosis not present

## 2013-06-15 DIAGNOSIS — I10 Essential (primary) hypertension: Secondary | ICD-10-CM | POA: Diagnosis not present

## 2013-06-15 DIAGNOSIS — R000 Tachycardia, unspecified: Secondary | ICD-10-CM | POA: Diagnosis not present

## 2013-06-15 DIAGNOSIS — I4891 Unspecified atrial fibrillation: Secondary | ICD-10-CM | POA: Diagnosis not present

## 2013-06-15 DIAGNOSIS — K219 Gastro-esophageal reflux disease without esophagitis: Secondary | ICD-10-CM | POA: Diagnosis not present

## 2013-06-15 DIAGNOSIS — E782 Mixed hyperlipidemia: Secondary | ICD-10-CM | POA: Diagnosis not present

## 2013-06-15 DIAGNOSIS — F411 Generalized anxiety disorder: Secondary | ICD-10-CM | POA: Diagnosis not present

## 2013-06-15 LAB — CMP14+EGFR
Albumin/Globulin Ratio: 1.6 (ref 1.1–2.5)
Albumin: 4.5 g/dL (ref 3.5–4.8)
BUN: 12 mg/dL (ref 8–27)
Chloride: 96 mmol/L — ABNORMAL LOW (ref 97–108)
GFR calc Af Amer: 80 mL/min/{1.73_m2} (ref >59)
GFR calc non Af Amer: 70 mL/min/{1.73_m2} (ref >59)
Glucose: 102 mg/dL — ABNORMAL HIGH (ref 65–99)
Total Bilirubin: 0.2 mg/dL (ref 0.0–1.2)
Total Protein: 7.3 g/dL (ref 6.0–8.5)

## 2013-06-24 ENCOUNTER — Ambulatory Visit: Payer: Medicare Other | Admitting: Cardiology

## 2013-06-25 ENCOUNTER — Telehealth: Payer: Self-pay | Admitting: General Practice

## 2013-06-25 NOTE — Telephone Encounter (Signed)
Pt notified of lab results Verbalizes understanding 

## 2013-07-09 ENCOUNTER — Telehealth: Payer: Self-pay | Admitting: General Practice

## 2013-07-09 NOTE — Telephone Encounter (Signed)
Please advise 

## 2013-07-14 ENCOUNTER — Institutional Professional Consult (permissible substitution): Payer: Medicare Other | Admitting: Cardiology

## 2013-07-15 ENCOUNTER — Other Ambulatory Visit: Payer: Self-pay | Admitting: General Practice

## 2013-07-15 DIAGNOSIS — R040 Epistaxis: Secondary | ICD-10-CM

## 2013-07-15 NOTE — Telephone Encounter (Signed)
Referral request made

## 2013-07-20 DIAGNOSIS — Z23 Encounter for immunization: Secondary | ICD-10-CM | POA: Diagnosis not present

## 2013-07-22 ENCOUNTER — Telehealth: Payer: Self-pay | Admitting: Interventional Cardiology

## 2013-07-22 NOTE — Telephone Encounter (Signed)
New Problem:  Pt states she is calling for the results of her heart monitor. Please advise

## 2013-07-26 DIAGNOSIS — J342 Deviated nasal septum: Secondary | ICD-10-CM | POA: Diagnosis not present

## 2013-07-26 DIAGNOSIS — R040 Epistaxis: Secondary | ICD-10-CM | POA: Diagnosis not present

## 2013-07-27 NOTE — Telephone Encounter (Signed)
returned pt call.lmom  monitor reulsts normal.pt to return call if she has any questions

## 2013-07-30 ENCOUNTER — Other Ambulatory Visit: Payer: Self-pay | Admitting: *Deleted

## 2013-07-30 MED ORDER — HYDROCHLOROTHIAZIDE 12.5 MG PO CAPS: 12.5000 mg | ORAL_CAPSULE | Freq: Every day | ORAL | Status: DC

## 2013-11-26 ENCOUNTER — Telehealth: Payer: Self-pay | Admitting: General Practice

## 2013-11-26 ENCOUNTER — Encounter: Payer: Self-pay | Admitting: General Practice

## 2013-11-26 ENCOUNTER — Ambulatory Visit (INDEPENDENT_AMBULATORY_CARE_PROVIDER_SITE_OTHER): Payer: Medicare Other | Admitting: General Practice

## 2013-11-26 VITALS — BP 139/88 | HR 84 | Temp 96.6°F | Ht 63.0 in | Wt 190.0 lb

## 2013-11-26 DIAGNOSIS — J029 Acute pharyngitis, unspecified: Secondary | ICD-10-CM

## 2013-11-26 DIAGNOSIS — J0100 Acute maxillary sinusitis, unspecified: Secondary | ICD-10-CM | POA: Diagnosis not present

## 2013-11-26 LAB — POCT RAPID STREP A (OFFICE): Rapid Strep A Screen: NEGATIVE

## 2013-11-26 MED ORDER — METHYLPREDNISOLONE ACETATE 80 MG/ML IJ SUSP
80.0000 mg | Freq: Once | INTRAMUSCULAR | Status: AC
  Administered 2013-11-26: 80 mg via INTRAMUSCULAR

## 2013-11-26 MED ORDER — AZITHROMYCIN 250 MG PO TABS: ORAL_TABLET | ORAL | Status: DC

## 2013-11-26 NOTE — Patient Instructions (Signed)

## 2013-11-26 NOTE — Progress Notes (Signed)
   Subjective:    Patient ID: Stacey [name], female    DOB: 07/21/1941, 73 y.o.   MRN: [id number]  Sore Throat  This is a new problem. The current episode started yesterday. The problem has been gradually worsening. The pain is worse on the left side. There has been no fever. The pain is at a severity of 10/10. Associated symptoms include congestion. Pertinent negatives include no coughing or headaches. She has tried gargles for the symptoms. The treatment provided mild relief.  Sinusitis This is a new problem. The current episode started yesterday. The problem has been gradually worsening since onset. There has been no fever. Her pain is at a severity of 10/10. Associated symptoms include congestion, sinus pressure and a sore throat. Pertinent negatives include no chills, coughing or headaches. Past treatments include oral decongestants. The treatment provided no relief.      Review of Systems  Constitutional: Negative for fever and chills.  HENT: Positive for congestion, sinus pressure and sore throat.   Respiratory: Negative for cough and chest tightness.   Cardiovascular: Negative for chest pain and palpitations.  Neurological: Negative for dizziness, weakness and headaches.       Objective:   Physical Exam  Constitutional: She is oriented to person, place, and time. She appears well-developed and well-nourished.  HENT:  Head: Normocephalic and atraumatic.  Nose: Right sinus exhibits maxillary sinus tenderness and frontal sinus tenderness. Left sinus exhibits maxillary sinus tenderness and frontal sinus tenderness.  Mouth/Throat: Posterior oropharyngeal erythema present.  Cardiovascular: Normal rate, regular rhythm and normal heart sounds.   No murmur heard. Pulmonary/Chest: Effort normal and breath sounds normal. No respiratory distress. She exhibits no tenderness.  Neurological: She is alert and oriented to person, place, and time.  Skin: Skin is warm and dry. No rash noted.    Psychiatric: She has a normal mood and affect.      Results for orders placed in visit on 11/26/13  POCT RAPID STREP A (OFFICE)      Result Value Ref Range   Rapid Strep A Screen Negative  Negative       Assessment & Plan:  1. Sore throat  - Rapid Strep A  2. Sinusitis, acute maxillary - methylPREDNISolone acetate (DEPO-MEDROL) injection 80 mg; Inject 1 mL (80 mg total) into the muscle once. - azithromycin (ZITHROMAX) 250 MG tablet; Take as directed  Dispense: 6 tablet; Refill: 0 RTO prn Patient verbalized understanding Coralie KeensMae E. Karagan Lehr, FNP-C

## 2013-11-27 ENCOUNTER — Other Ambulatory Visit: Payer: Self-pay | Admitting: General Practice

## 2013-11-27 MED ORDER — FLUCONAZOLE 150 MG PO TABS: 150.0000 mg | ORAL_TABLET | Freq: Once | ORAL | Status: DC

## 2013-11-27 NOTE — Telephone Encounter (Signed)
Script sent to pharmacy.

## 2014-02-10 IMAGING — CR DG CHEST 2V
2 series · 2 of 2 positions shown · non-contrast
Comparison: 10/18/2005

CLINICAL DATA: Heart palpitations

CHEST - 2 VIEW

[w chest pa]
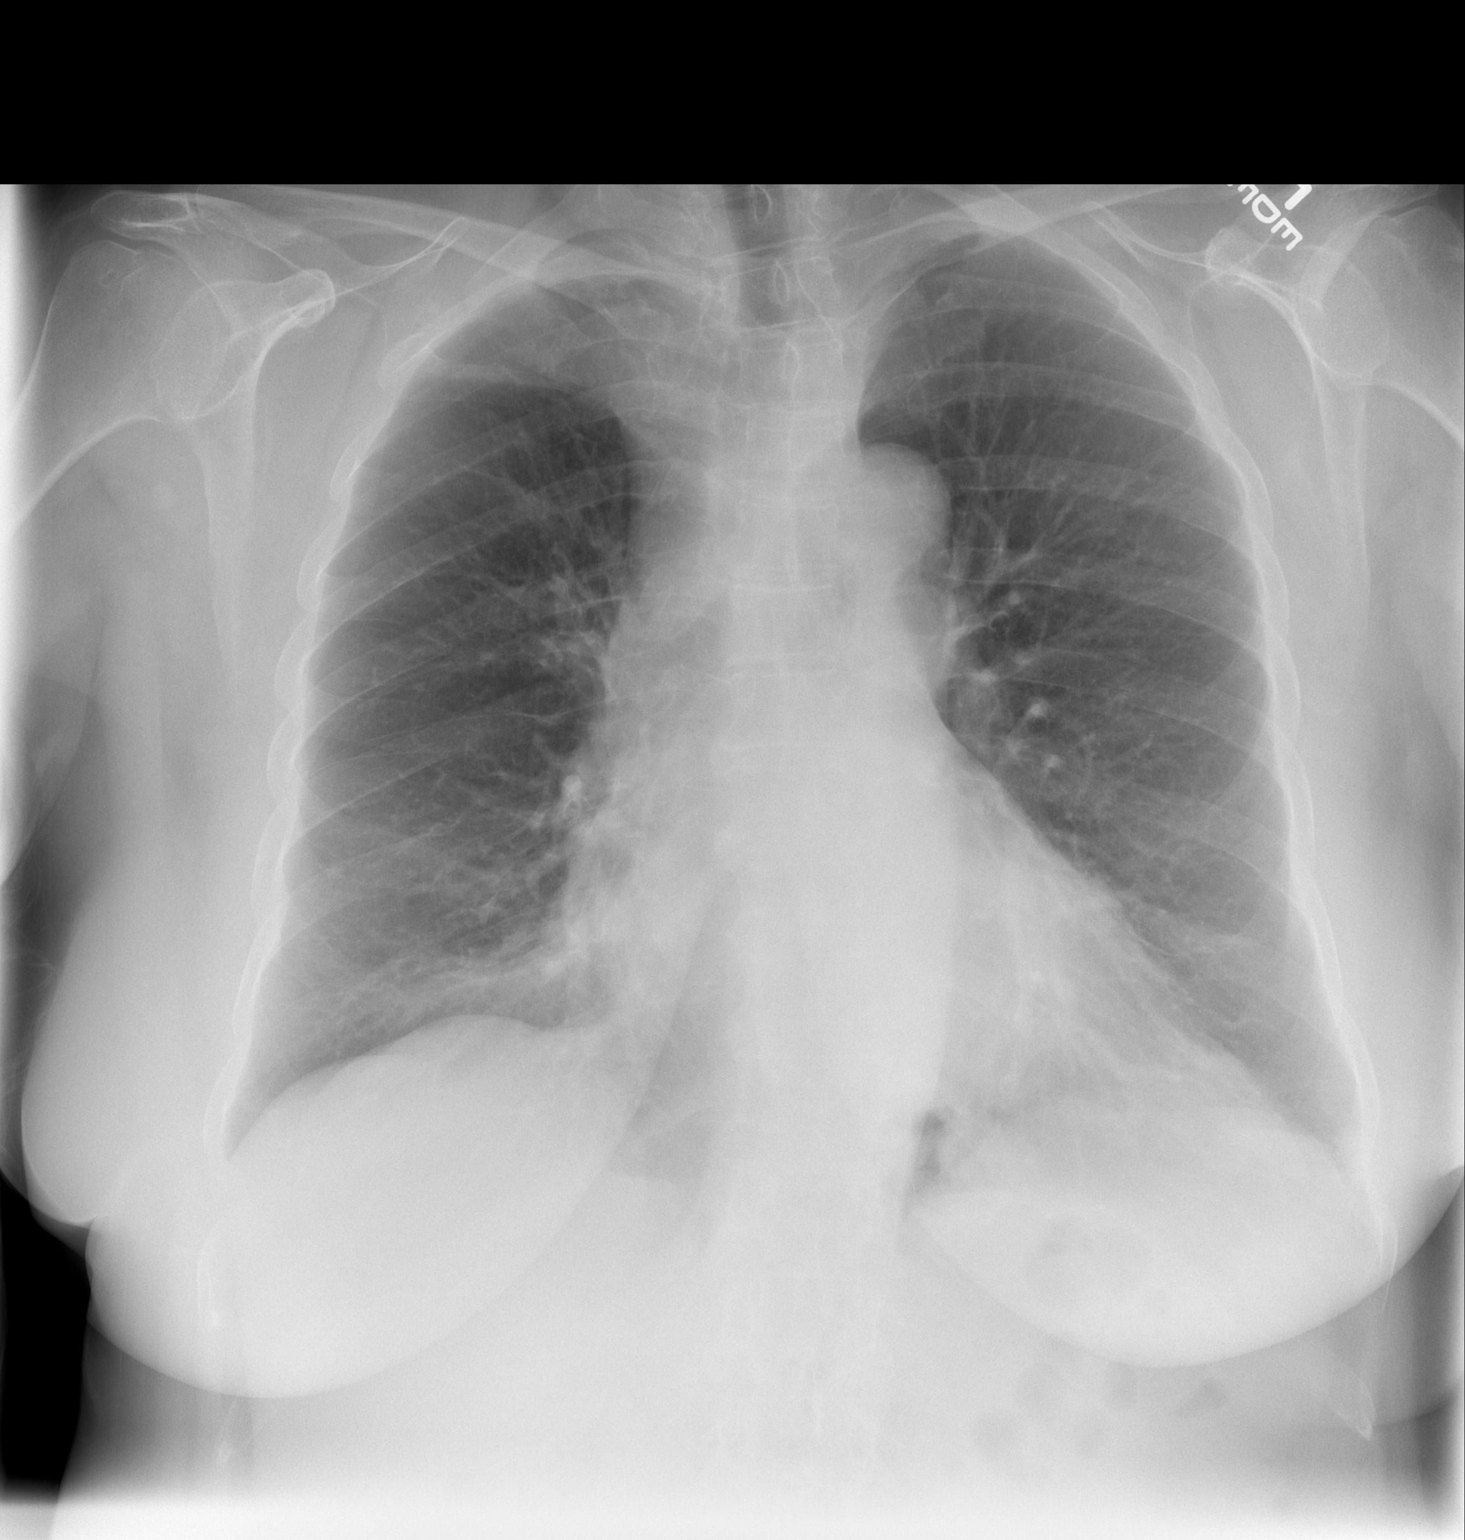

[w chest lat]
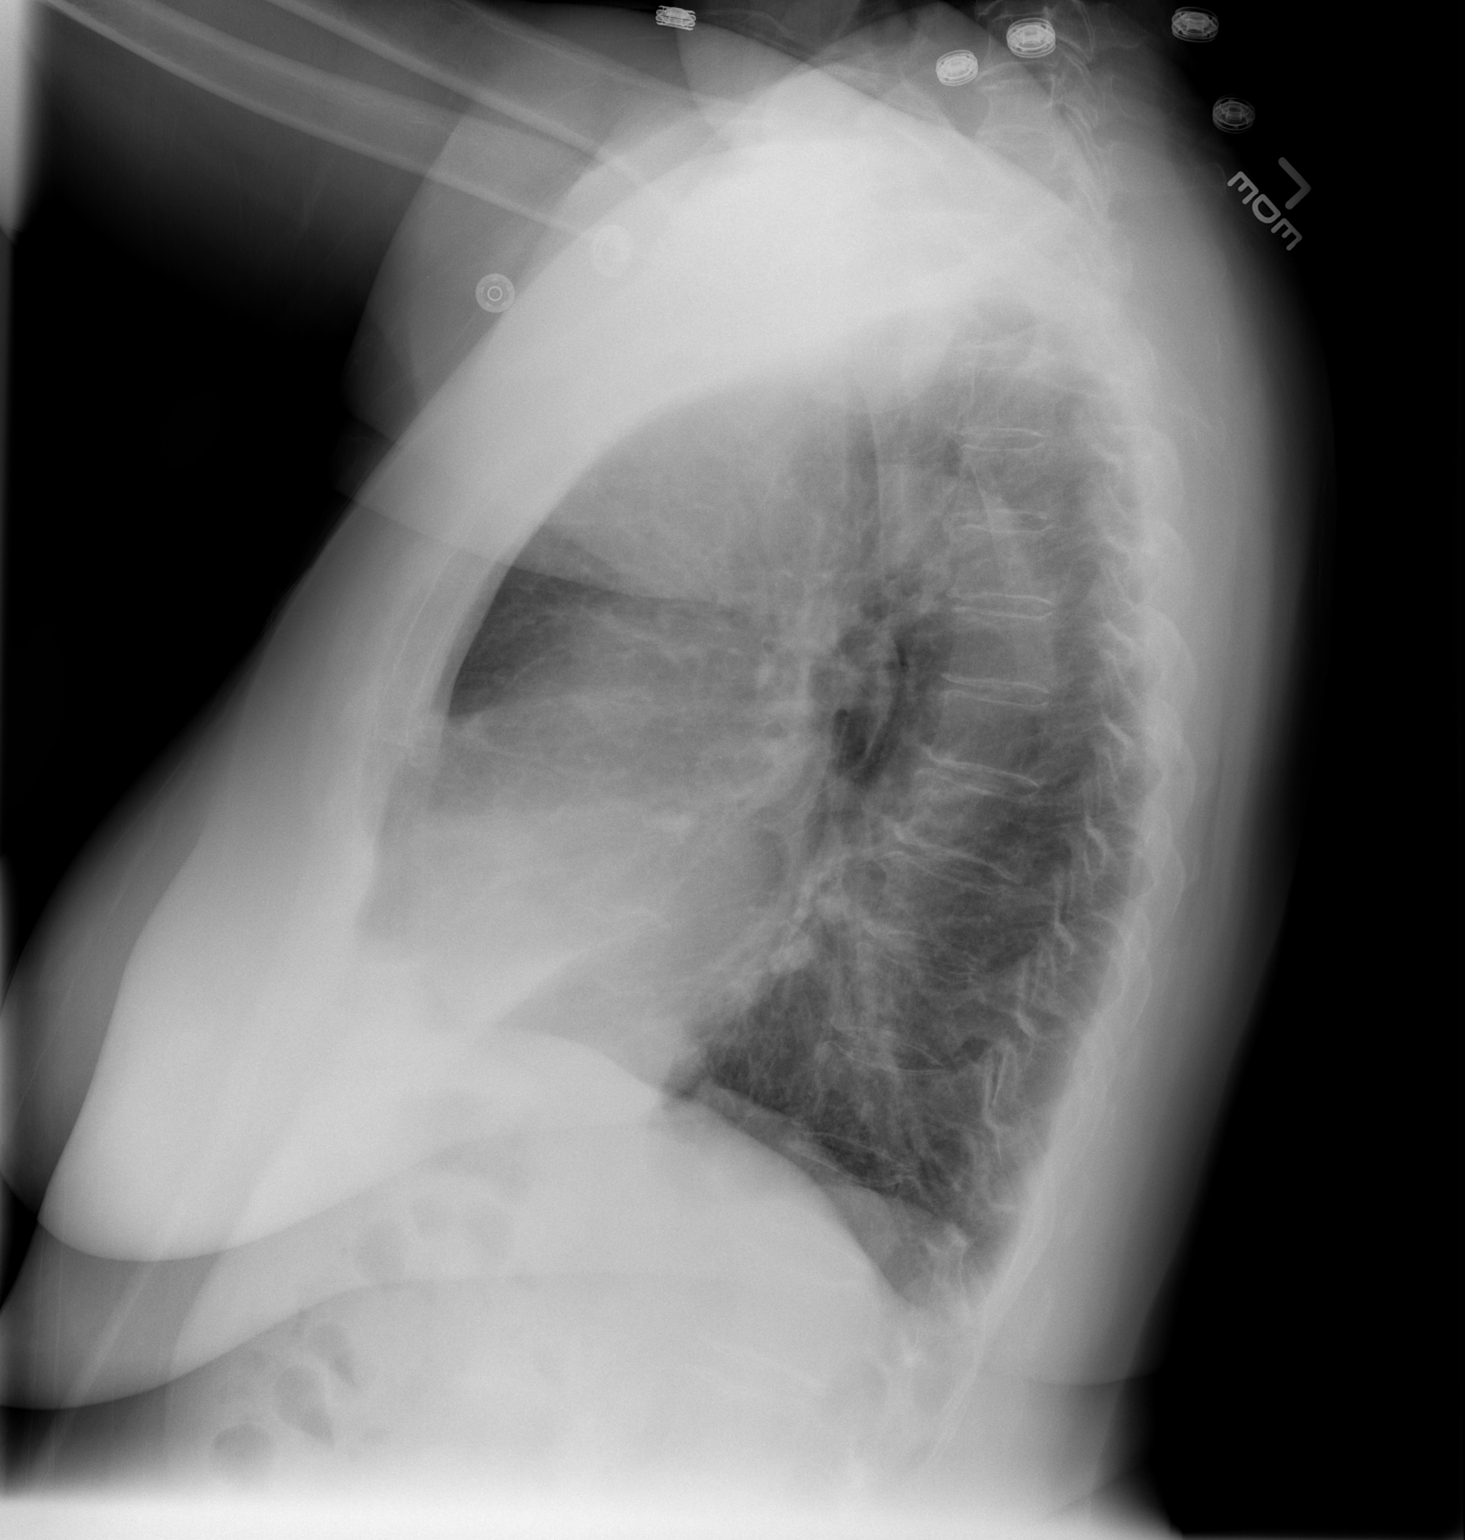

[2 of 2 positions shown; findings below may reference images not displayed]

FINDINGS: Mild bibasilar atelectasis.  Negative for heart failure
or effusion.  No definite pneumonia.
IMPRESSION: Mild bibasilar atelectasis.

## 2014-02-23 ENCOUNTER — Ambulatory Visit (INDEPENDENT_AMBULATORY_CARE_PROVIDER_SITE_OTHER): Payer: Medicare Other | Admitting: Physician Assistant

## 2014-02-23 ENCOUNTER — Encounter: Payer: Self-pay | Admitting: Physician Assistant

## 2014-02-23 ENCOUNTER — Encounter (INDEPENDENT_AMBULATORY_CARE_PROVIDER_SITE_OTHER): Payer: Self-pay

## 2014-02-23 VITALS — BP 156/70 | HR 85 | Temp 98.6°F | Ht 63.0 in | Wt 197.0 lb

## 2014-02-23 DIAGNOSIS — R609 Edema, unspecified: Secondary | ICD-10-CM | POA: Diagnosis not present

## 2014-02-23 DIAGNOSIS — R600 Localized edema: Secondary | ICD-10-CM | POA: Insufficient documentation

## 2014-02-23 MED ORDER — HYDROCHLOROTHIAZIDE 25 MG PO TABS: 25.0000 mg | ORAL_TABLET | Freq: Every day | ORAL | Status: DC

## 2014-02-23 NOTE — Patient Instructions (Signed)

## 2014-02-23 NOTE — Progress Notes (Signed)
Subjective:     Patient ID: Stacey [name], female   DOB: 12/26/1940, 73 y.o.   MRN: [id number]  HPI Pt here with bilat lower ext edema R>L Hx of this since she was discharged from the hospital in Aug She was on HCTZ but let this rx lapse Sx are worse by the end of the day She denies any assoc SOB or CP   Review of Systems + Pain to bilat legs when edema is bad Denies any redness or drainage form the legs Denies any post calf pain    Objective:   Physical Exam Vital reviewed No bruits Heart- RRR w/o M Lungs- CTA +   1+ edema bilat Good pulses No noted skin breakdown BMP pending    Assessment:     Lower ext edema    Plan:     Restart HCTZ 25mg  q d Pt to get good dietary intake of K Compression hose F/U in 1 month for eval

## 2014-02-24 LAB — BMP8+EGFR
BUN/Creatinine Ratio: 23 (ref 11–26)
BUN: 18 mg/dL (ref 8–27)
CHLORIDE: 104 mmol/L (ref 97–108)
CO2: 24 mmol/L (ref 18–29)
Calcium: 9.5 mg/dL (ref 8.7–10.3)
Creatinine, Ser: 0.79 mg/dL (ref 0.57–1.00)
GFR calc Af Amer: 87 mL/min/{1.73_m2} (ref >59)
GFR calc non Af Amer: 76 mL/min/{1.73_m2} (ref >59)
Glucose: 115 mg/dL — ABNORMAL HIGH (ref 65–99)
Potassium: 4 mmol/L (ref 3.5–5.2)
SODIUM: 143 mmol/L (ref 134–144)

## 2014-02-24 IMAGING — CR DG CHEST 2V
2 series · 2 of 2 positions shown · non-contrast
Comparison: May 27, 2013

CLINICAL DATA: Anxiety and fatigue

CHEST - 2 VIEW

[w chest pa]
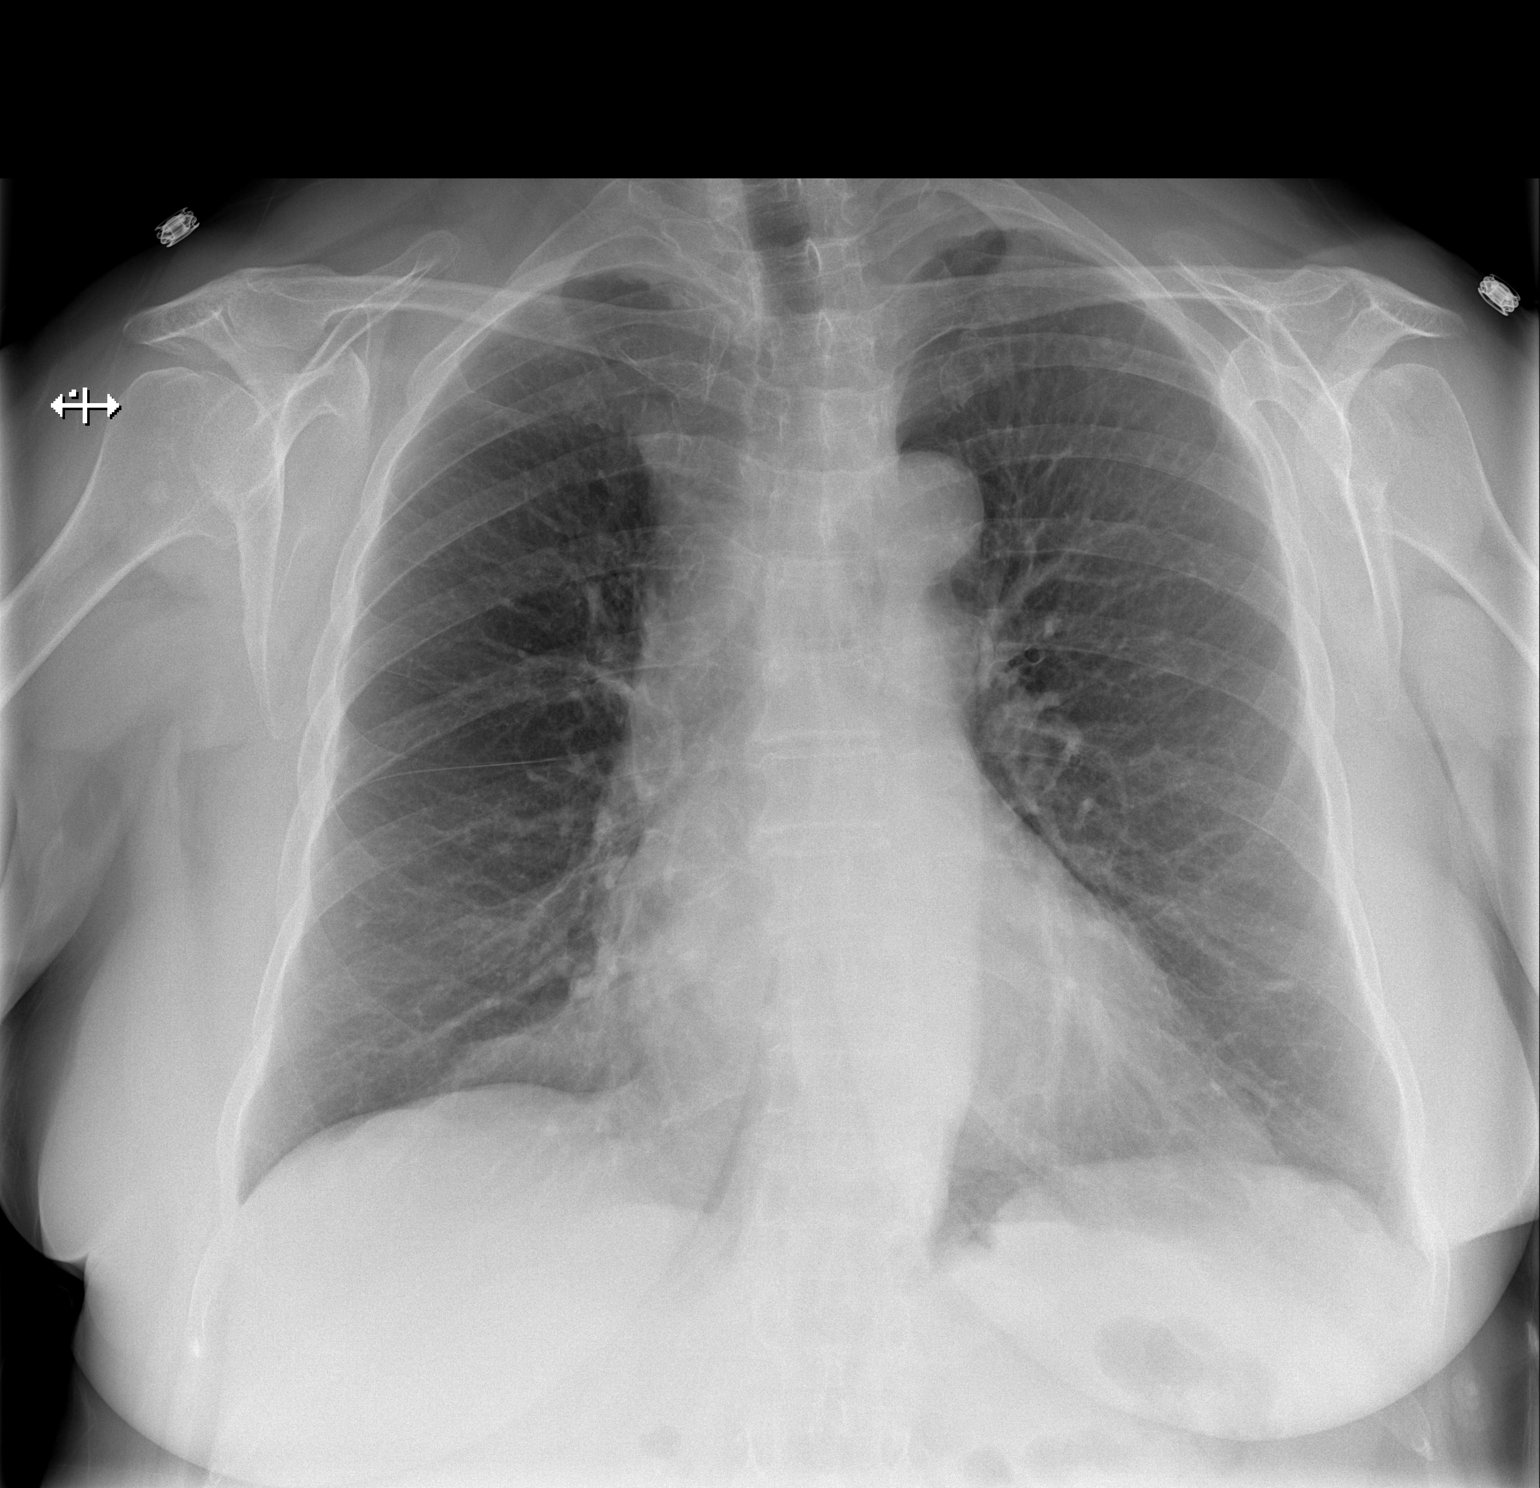

[w chest lat]
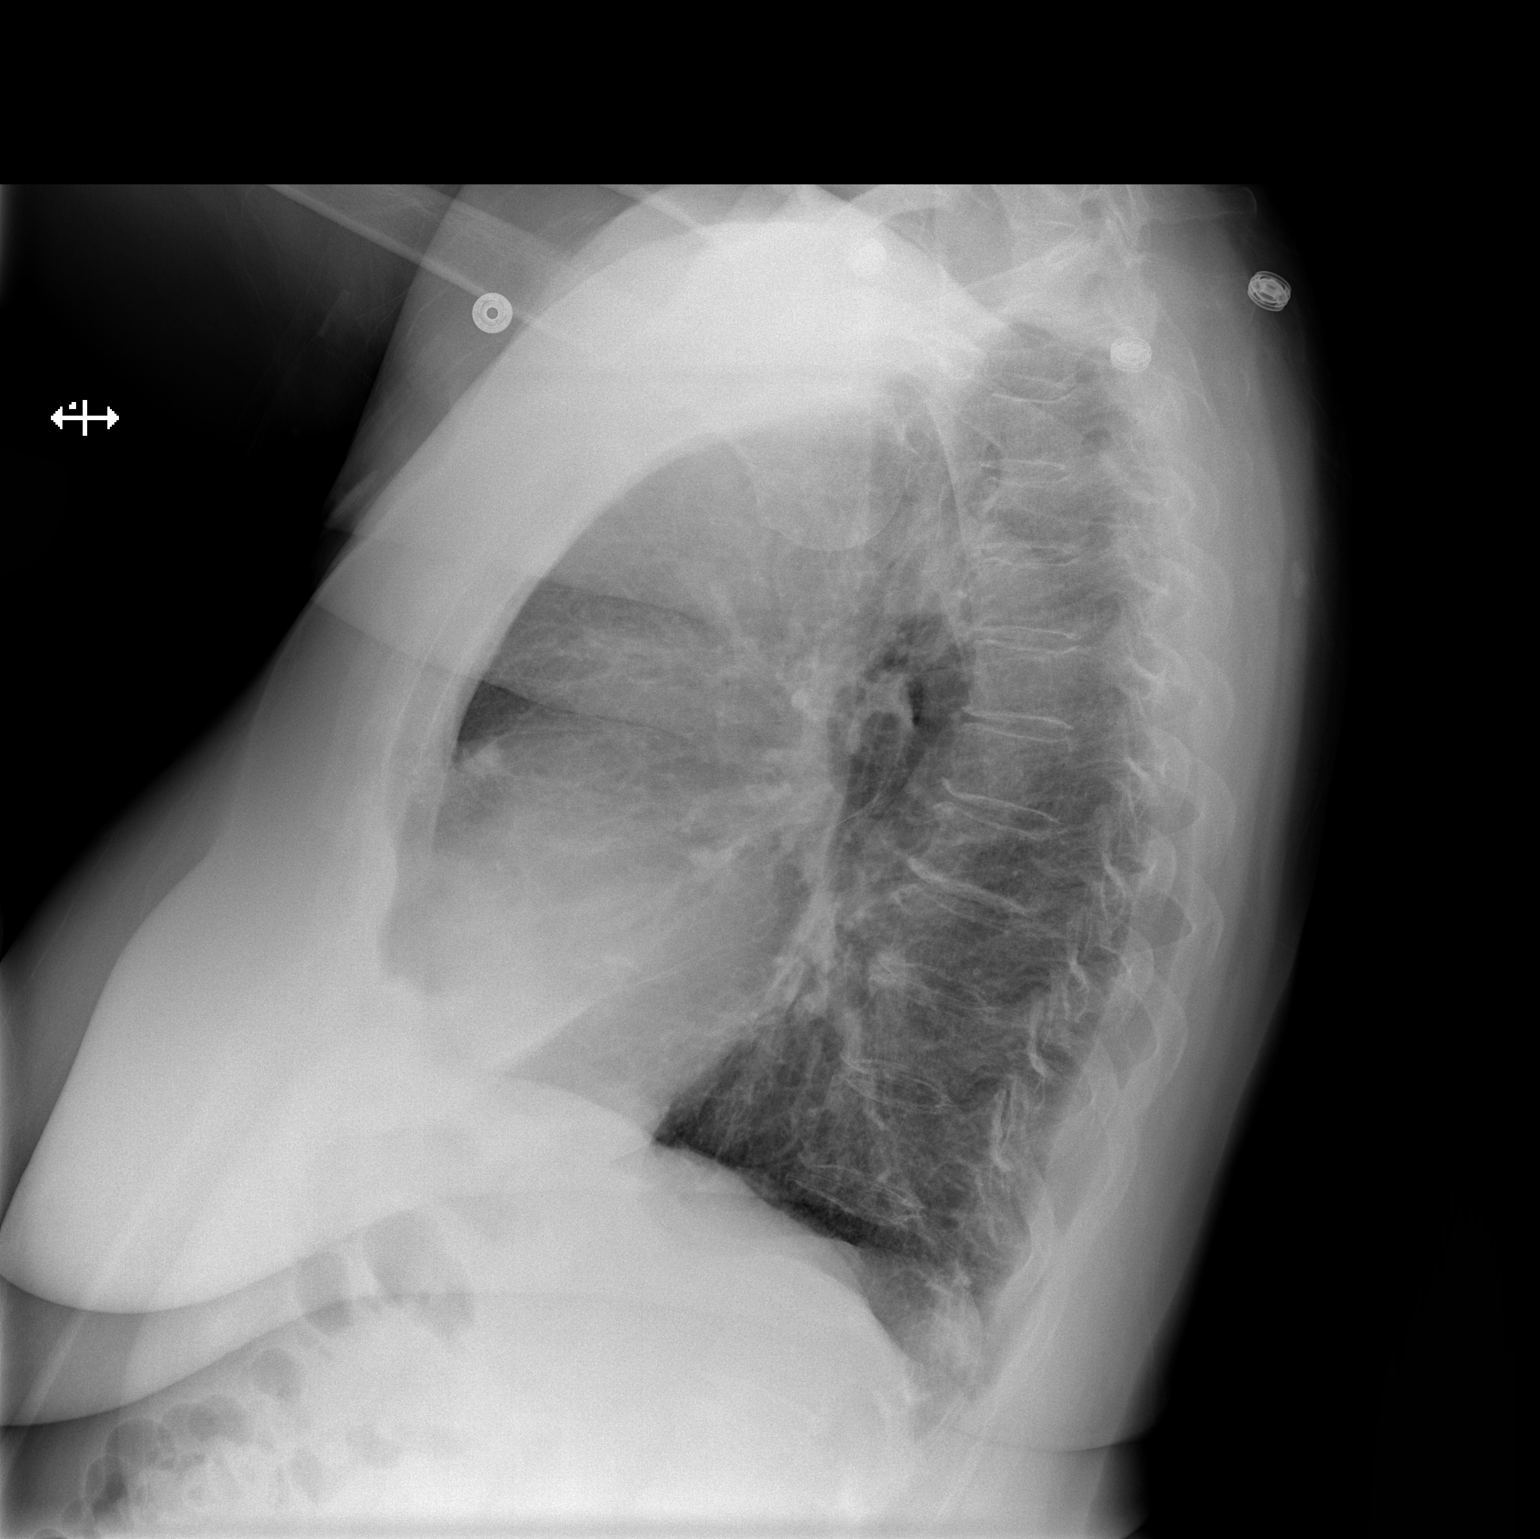

[2 of 2 positions shown; findings below may reference images not displayed]

FINDINGS: There is a 1 x 1 cm nodular opacity in the inferior
anterior mediastinum seen only on the lateral view.  Elsewhere, the
lungs are clear with the exception of minimal bibasilar scarring.
Heart size and pulmonary vascularity are normal.  No adenopathy.
There is mild degenerative change in the thoracic spine.
IMPRESSION: 1 x 1 cm nodular opacity in the inferior anterior mediastinum seen
only on the lateral view.  This finding warrants a noncontrast
enhanced chest CT to further evaluate.

There is no edema or consolidation.

## 2014-04-20 ENCOUNTER — Other Ambulatory Visit: Payer: Self-pay | Admitting: Physician Assistant

## 2014-06-22 ENCOUNTER — Encounter: Payer: Self-pay | Admitting: Family

## 2014-06-22 ENCOUNTER — Ambulatory Visit (INDEPENDENT_AMBULATORY_CARE_PROVIDER_SITE_OTHER): Payer: Medicare Other | Admitting: Family

## 2014-06-22 VITALS — BP 141/83 | HR 87 | Temp 97.0°F | Ht 63.0 in | Wt 195.0 lb

## 2014-06-22 DIAGNOSIS — J0100 Acute maxillary sinusitis, unspecified: Secondary | ICD-10-CM

## 2014-06-22 MED ORDER — AMOXICILLIN-POT CLAVULANATE 875-125 MG PO TABS: 1.0000 | ORAL_TABLET | Freq: Two times a day (BID) | ORAL | Status: DC

## 2014-06-22 MED ORDER — BENZONATATE 200 MG PO CAPS: 200.0000 mg | ORAL_CAPSULE | Freq: Three times a day (TID) | ORAL | Status: DC | PRN

## 2014-06-22 MED ORDER — METHYLPREDNISOLONE ACETATE 40 MG/ML IJ SUSP
40.0000 mg | Freq: Once | INTRAMUSCULAR | Status: AC
  Administered 2014-06-22: 40 mg via INTRAMUSCULAR

## 2014-06-22 MED ORDER — FLUCONAZOLE 150 MG PO TABS: 150.0000 mg | ORAL_TABLET | Freq: Once | ORAL | Status: DC

## 2014-06-22 NOTE — Patient Instructions (Signed)

## 2014-06-22 NOTE — Progress Notes (Signed)
Subjective:    Patient ID: Stacey [name], female    DOB: 12/10/1940, 73 y.o.   MRN: [id number]  Sinusitis This is a new problem. The current episode started 1 to 4 weeks ago. The problem has been gradually worsening since onset. There has been no fever. Her pain is at a severity of 7/10. The pain is moderate. Associated symptoms include chills, congestion, coughing, ear pain, headaches, sinus pressure and sneezing. Pertinent negatives include no shortness of breath or sore throat. Past treatments include oral decongestants. The treatment provided mild relief.      Review of Systems  Constitutional: Positive for chills.  HENT: Positive for congestion, ear pain, sinus pressure and sneezing. Negative for sore throat.   Eyes: Negative.   Respiratory: Positive for cough. Negative for shortness of breath.   Cardiovascular: Negative.  Negative for palpitations.  Gastrointestinal: Negative.   Endocrine: Negative.   Genitourinary: Negative.   Musculoskeletal: Negative.   Neurological: Positive for headaches.  Hematological: Negative.   Psychiatric/Behavioral: Negative.   All other systems reviewed and are negative.      Objective:   Physical Exam  Vitals reviewed. Constitutional: She is oriented to person, place, and time. She appears well-developed and well-nourished. No distress.  HENT:  Head: Normocephalic and atraumatic.  Right Ear: External ear normal.  Left Ear: External ear normal.  Nose: Right sinus exhibits maxillary sinus tenderness and frontal sinus tenderness. Left sinus exhibits maxillary sinus tenderness and frontal sinus tenderness.  Nasal passage erythemas with mild swelling Oropharynx erythemas  Eyes: Pupils are equal, round, and reactive to light.  Neck: Normal range of motion. Neck supple. No thyromegaly present.  Cardiovascular: Normal rate, regular rhythm, normal heart sounds and intact distal pulses.   No murmur heard. Pulmonary/Chest: Effort normal and  breath sounds normal. No respiratory distress. She has no wheezes.  Abdominal: Soft. Bowel sounds are normal. She exhibits no distension. There is no tenderness.  Musculoskeletal: Normal range of motion. She exhibits no edema and no tenderness.  Neurological: She is alert and oriented to person, place, and time. She has normal reflexes. No cranial nerve deficit.  Skin: Skin is warm and dry.  Psychiatric: She has a normal mood and affect. Her behavior is normal. Judgment and thought content normal.      BP 141/83  Pulse 87  Temp(Src) 97 F (36.1 C) (Oral)  Ht  (1.6 m)  Wt 195 lb (88.451 kg)  BMI 34.55 kg/m2     Assessment & Plan:  1. Acute maxillary sinusitis, recurrence not specified -- Take meds as prescribed - Use a cool mist humidifier  -Use saline nose sprays frequently -Saline irrigations of the nose can be very helpful if done frequently.  * 4X daily for 1 week*  * Use of a nettie pot can be helpful with this. Follow directions with this* -Force fluids -For any cough or congestion  Use plain Mucinex- regular strength or max strength is fine   * Children- consult with Pharmacist for dosing -For fever or aces or pains- take tylenol or ibuprofen appropriate for age and weight.  * for fevers greater than 101 orally you may alternate ibuprofen and tylenol every  3 hours. -Throat lozenges if help - amoxicillin-clavulanate (AUGMENTIN) 875-125 MG per tablet; Take 1 tablet by mouth 2 (two) times daily.  Dispense: 20 tablet; Refill: 0 - benzonatate (TESSALON) 200 MG capsule; Take 1 capsule (200 mg total) by mouth 3 (three) times daily as needed for cough.  Dispense:  30 capsule; Refill: 1 - methylPREDNISolone acetate (DEPO-MEDROL) injection 40 mg; Inject 1 mL (40 mg total) into the muscle once.  Jannifer Rodney, FNP

## 2014-07-13 DIAGNOSIS — Z23 Encounter for immunization: Secondary | ICD-10-CM | POA: Diagnosis not present

## 2014-08-24 ENCOUNTER — Other Ambulatory Visit: Payer: Self-pay | Admitting: Physician Assistant

## 2014-09-21 ENCOUNTER — Ambulatory Visit (INDEPENDENT_AMBULATORY_CARE_PROVIDER_SITE_OTHER): Payer: Medicare Other | Admitting: Nurse Practitioner

## 2014-09-21 ENCOUNTER — Encounter: Payer: Self-pay | Admitting: Nurse Practitioner

## 2014-09-21 VITALS — BP 145/91 | HR 96 | Temp 97.5°F | Ht 63.0 in | Wt 193.0 lb

## 2014-09-21 DIAGNOSIS — J0100 Acute maxillary sinusitis, unspecified: Secondary | ICD-10-CM

## 2014-09-21 MED ORDER — AZITHROMYCIN 250 MG PO TABS: ORAL_TABLET | ORAL | Status: DC

## 2014-09-21 MED ORDER — BENZONATATE 100 MG PO CAPS: 100.0000 mg | ORAL_CAPSULE | Freq: Two times a day (BID) | ORAL | Status: DC | PRN

## 2014-09-21 NOTE — Progress Notes (Signed)
   Subjective:    Patient ID: Stacey [name], female    DOB: 02/25/1941, 73 y.o.   MRN: [id number]  HPI Patient in c/o chills that started 3 nights ago- ran a fever for 2 days- now has facila pressure and dry cough.    Review of Systems  Constitutional: Negative for fever and chills.  HENT: Positive for congestion, rhinorrhea and sinus pressure. Negative for sore throat.   Respiratory: Positive for cough. Negative for shortness of breath.   Cardiovascular: Negative.   Gastrointestinal: Negative.   Neurological: Negative.   Psychiatric/Behavioral: Negative.   All other systems reviewed and are negative.      Objective:   Physical Exam  Constitutional: She is oriented to person, place, and time. She appears well-developed and well-nourished. No distress.  HENT:  Right Ear: Hearing, tympanic membrane, external ear and ear canal normal.  Left Ear: Hearing, tympanic membrane, external ear and ear canal normal.  Nose: Mucosal edema and rhinorrhea present. Right sinus exhibits maxillary sinus tenderness. Right sinus exhibits no frontal sinus tenderness. Left sinus exhibits maxillary sinus tenderness. Left sinus exhibits no frontal sinus tenderness.  Mouth/Throat: Uvula is midline, oropharynx is clear and moist and mucous membranes are normal.  Eyes: Pupils are equal, round, and reactive to light.  Neck: Normal range of motion. Neck supple.  Cardiovascular: Normal rate, regular rhythm and normal heart sounds.   Pulmonary/Chest: Effort normal and breath sounds normal.  Lymphadenopathy:    She has no cervical adenopathy.  Neurological: She is alert and oriented to person, place, and time.  Skin: Skin is warm and dry.  Psychiatric: She has a normal mood and affect. Her behavior is normal. Judgment and thought content normal.   BP 145/91 mmHg  Pulse 96  Temp(Src) 97.5 F (36.4 C) (Oral)  Ht 5\' 3"  (1.6 m)  Wt 193 lb (87.544 kg)  BMI 34.20 kg/m2        Assessment & Plan:  1.  Acute maxillary sinusitis, recurrence not specified 1. Take meds as prescribed 2. Use a cool mist humidifier especially during the winter months and when heat has been humid. 3. Use saline nose sprays frequently 4. Saline irrigations of the nose can be very helpful if done frequently.  * 4X daily for 1 week*  * Use of a nettie pot can be helpful with this. Follow directions with this* 5. Drink plenty of fluids 6. Keep thermostat turn down low 7.For any cough or congestion  Use plain Mucinex- regular strength or max strength is fine- no DECONGESTANTS   * Children- consult with Pharmacist for dosing 8. For fever or aces or pains- take tylenol or ibuprofen appropriate for age and weight.  * for fevers greater than 101 orally you may alternate ibuprofen and tylenol every  3 hours.   - azithromycin (ZITHROMAX Z-PAK) 250 MG tablet; As directed  Dispense: 6 each; Refill: 0 - benzonatate (TESSALON) 100 MG capsule; Take 1 capsule (100 mg total) by mouth 2 (two) times daily as needed for cough.  Dispense: 20 capsule; Refill: 0  Mary-Margaret Daphine DeutscherMartin, FNP

## 2014-09-21 NOTE — Patient Instructions (Signed)

## 2014-12-22 ENCOUNTER — Other Ambulatory Visit: Payer: Self-pay | Admitting: Family Medicine

## 2015-01-03 ENCOUNTER — Encounter: Payer: Self-pay | Admitting: Family

## 2015-01-03 ENCOUNTER — Ambulatory Visit (INDEPENDENT_AMBULATORY_CARE_PROVIDER_SITE_OTHER): Payer: Medicare Other | Admitting: Family

## 2015-01-03 VITALS — BP 152/87 | HR 84 | Temp 96.8°F | Ht 63.0 in | Wt 198.4 lb

## 2015-01-03 DIAGNOSIS — M1990 Unspecified osteoarthritis, unspecified site: Secondary | ICD-10-CM | POA: Diagnosis not present

## 2015-01-03 DIAGNOSIS — Z1321 Encounter for screening for nutritional disorder: Secondary | ICD-10-CM | POA: Diagnosis not present

## 2015-01-03 DIAGNOSIS — K2100 Gastro-esophageal reflux disease with esophagitis, without bleeding: Secondary | ICD-10-CM

## 2015-01-03 DIAGNOSIS — R0602 Shortness of breath: Secondary | ICD-10-CM | POA: Diagnosis not present

## 2015-01-03 DIAGNOSIS — R609 Edema, unspecified: Secondary | ICD-10-CM

## 2015-01-03 DIAGNOSIS — I10 Essential (primary) hypertension: Secondary | ICD-10-CM

## 2015-01-03 DIAGNOSIS — R002 Palpitations: Secondary | ICD-10-CM | POA: Diagnosis not present

## 2015-01-03 DIAGNOSIS — M129 Arthropathy, unspecified: Secondary | ICD-10-CM | POA: Diagnosis not present

## 2015-01-03 DIAGNOSIS — E559 Vitamin D deficiency, unspecified: Secondary | ICD-10-CM | POA: Diagnosis not present

## 2015-01-03 MED ORDER — FUROSEMIDE 20 MG PO TABS: 20.0000 mg | ORAL_TABLET | Freq: Every day | ORAL | Status: DC

## 2015-01-03 MED ORDER — MELOXICAM 15 MG PO TABS: 15.0000 mg | ORAL_TABLET | Freq: Every day | ORAL | Status: DC

## 2015-01-03 MED ORDER — HYDROCHLOROTHIAZIDE 25 MG PO TABS: 25.0000 mg | ORAL_TABLET | Freq: Every day | ORAL | Status: DC

## 2015-01-03 NOTE — Patient Instructions (Signed)
Arthritis, Nonspecific Arthritis is inflammation of a joint. This usually means pain, redness, warmth or swelling are present. One or more joints may be involved. There are a number of types of arthritis. Your caregiver may not be able to tell what type of arthritis you have right away. CAUSES  The most common cause of arthritis is the wear and tear on the joint (osteoarthritis). This causes damage to the cartilage, which can break down over time. The knees, hips, back and neck are most often affected by this type of arthritis. Other types of arthritis and common causes of joint pain include:  Sprains and other injuries near the joint. Sometimes minor sprains and injuries cause pain and swelling that develop hours later.  Rheumatoid arthritis. This affects hands, feet and knees. It usually affects both sides of your body at the same time. It is often associated with chronic ailments, fever, weight loss and general weakness.  Crystal arthritis. Gout and pseudo gout can cause occasional acute severe pain, redness and swelling in the foot, ankle, or knee.  Infectious arthritis. Bacteria can get into a joint through a break in overlying skin. This can cause infection of the joint. Bacteria and viruses can also spread through the blood and affect your joints.  Drug, infectious and allergy reactions. Sometimes joints can become mildly painful and slightly swollen with these types of illnesses. SYMPTOMS   Pain is the main symptom.  Your joint or joints can also be red, swollen and warm or hot to the touch.  You may have a fever with certain types of arthritis, or even feel overall ill.  The joint with arthritis will hurt with movement. Stiffness is present with some types of arthritis. DIAGNOSIS  Your caregiver will suspect arthritis based on your description of your symptoms and on your exam. Testing may be needed to find the type of arthritis:  Blood and sometimes urine tests.  X-ray tests  and sometimes CT or MRI scans.  Removal of fluid from the joint (arthrocentesis) is done to check for bacteria, crystals or other causes. Your caregiver (or a specialist) will numb the area over the joint with a local anesthetic, and use a needle to remove joint fluid for examination. This procedure is only minimally uncomfortable.  Even with these tests, your caregiver may not be able to tell what kind of arthritis you have. Consultation with a specialist (rheumatologist) may be helpful. TREATMENT  Your caregiver will discuss with you treatment specific to your type of arthritis. If the specific type cannot be determined, then the following general recommendations may apply. Treatment of severe joint pain includes:  Rest.  Elevation.  Anti-inflammatory medication (for example, ibuprofen) may be prescribed. Avoiding activities that cause increased pain.  Only take over-the-counter or prescription medicines for pain and discomfort as recommended by your caregiver.  Cold packs over an inflamed joint may be used for 10 to 15 minutes every hour. Hot packs sometimes feel better, but do not use overnight. Do not use hot packs if you are diabetic without your caregiver's permission.  A cortisone shot into arthritic joints may help reduce pain and swelling.  Any acute arthritis that gets worse over the next 1 to 2 days needs to be looked at to be sure there is no joint infection. Long-term arthritis treatment involves modifying activities and lifestyle to reduce joint stress jarring. This can include weight loss. Also, exercise is needed to nourish the joint cartilage and remove waste. This helps keep the muscles  around the joint strong. HOME CARE INSTRUCTIONS   Do not take aspirin to relieve pain if gout is suspected. This elevates uric acid levels.  Only take over-the-counter or prescription medicines for pain, discomfort or fever as directed by your caregiver.  Rest the joint as much as  possible.  If your joint is swollen, keep it elevated.  Use crutches if the painful joint is in your leg.  Drinking plenty of fluids may help for certain types of arthritis.  Follow your caregiver's dietary instructions.  Try low-impact exercise such as:  Swimming.  Water aerobics.  Biking.  Walking.  Morning stiffness is often relieved by a warm shower.  Put your joints through regular range-of-motion. SEEK MEDICAL CARE IF:   You do not feel better in 24 hours or are getting worse.  You have side effects to medications, or are not getting better with treatment. SEEK IMMEDIATE MEDICAL CARE IF:   You have a fever.  You develop severe joint pain, swelling or redness.  Many joints are involved and become painful and swollen.  There is severe back pain and/or leg weakness.  You have loss of bowel or bladder control. Document Released: 10/31/2004 Document Revised: 12/16/2011 Document Reviewed: 11/16/2008 [employer] Patient Information 2015 [employer]. This information is not intended to replace advice given to you by your health care provider. Make sure you discuss any questions you have with your health care provider. Health Maintenance Adopting a healthy lifestyle and getting preventive care can go a long way to promote health and wellness. Talk with your health care provider about what schedule of regular examinations is right for you. This is a good chance for you to check in with your provider about disease prevention and staying healthy. In between checkups, there are plenty of things you can do on your own. Experts have done a lot of research about which lifestyle changes and preventive measures are most likely to keep you healthy. Ask your health care provider for more information. WEIGHT AND DIET  Eat a healthy diet  Be sure to include plenty of vegetables, fruits, low-fat dairy products, and lean protein.  Do not eat a lot of foods high in solid fats, added  sugars, or salt.  Get regular exercise. This is one of the most important things you can do for your health.  Most adults should exercise for at least 150 minutes each week. The exercise should increase your heart rate and make you sweat (moderate-intensity exercise).  Most adults should also do strengthening exercises at least twice a week. This is in addition to the moderate-intensity exercise.  Maintain a healthy weight  Body mass index (BMI) is a measurement that can be used to identify possible weight problems. It estimates body fat based on height and weight. Your health care provider can help determine your BMI and help you achieve or maintain a healthy weight.  For females 20 years of age and older:   A BMI below 18.5 is considered underweight.  A BMI of 18.5 to 24.9 is normal.  A BMI of 25 to 29.9 is considered overweight.  A BMI of 30 and above is considered obese.  Watch levels of cholesterol and blood lipids  You should start having your blood tested for lipids and cholesterol at 73 years of age, then have this test every 5 years.  You may need to have your cholesterol levels checked more often if:  Your lipid or cholesterol levels are high.  You are older   than 74 years of age.  You are at high risk for heart disease.  CANCER SCREENING   Lung Cancer  Lung cancer screening is recommended for adults 75-84 years old who are at high risk for lung cancer because of a history of smoking.  A yearly low-dose CT scan of the lungs is recommended for people who:  Currently smoke.  Have quit within the past 15 years.  Have at least a 30-pack-year history of smoking. A pack year is smoking an average of one pack of cigarettes a day for 1 year.  Yearly screening should continue until it has been 15 years since you quit.  Yearly screening should stop if you develop a health problem that would prevent you from having lung cancer treatment.  Breast Cancer  Practice  breast self-awareness. This means understanding how your breasts normally appear and feel.  It also means doing regular breast self-exams. Let your health care provider know about any changes, no matter how small.  If you are in your 20s or 30s, you should have a clinical breast exam (CBE) by a health care provider every 1-3 years as part of a regular health exam.  If you are 13 or older, have a CBE every year. Also consider having a breast X-ray (mammogram) every year.  If you have a family history of breast cancer, talk to your health care provider about genetic screening.  If you are at high risk for breast cancer, talk to your health care provider about having an MRI and a mammogram every year.  Breast cancer gene (BRCA) assessment is recommended for women who have family members with BRCA-related cancers. BRCA-related cancers include:  Breast.  Ovarian.  Tubal.  Peritoneal cancers.  Results of the assessment will determine the need for genetic counseling and BRCA1 and BRCA2 testing. Cervical Cancer Routine pelvic examinations to screen for cervical cancer are no longer recommended for nonpregnant women who are considered low risk for cancer of the pelvic organs (ovaries, uterus, and vagina) and who do not have symptoms. A pelvic examination may be necessary if you have symptoms including those associated with pelvic infections. Ask your health care provider if a screening pelvic exam is right for you.   The Pap test is the screening test for cervical cancer for women who are considered at risk.  If you had a hysterectomy for a problem that was not cancer or a condition that could lead to cancer, then you no longer need Pap tests.  If you are older than 65 years, and you have had normal Pap tests for the past 10 years, you no longer need to have Pap tests.  If you have had past treatment for cervical cancer or a condition that could lead to cancer, you need Pap tests and screening  for cancer for at least 20 years after your treatment.  If you no longer get a Pap test, assess your risk factors if they change (such as having a new sexual partner). This can affect whether you should start being screened again.  Some women have medical problems that increase their chance of getting cervical cancer. If this is the case for you, your health care provider may recommend more frequent screening and Pap tests.  The human papillomavirus (HPV) test is another test that may be used for cervical cancer screening. The HPV test looks for the virus that can cause cell changes in the cervix. The cells collected during the Pap test can be tested for  HPV.  The HPV test can be used to screen women 5 years of age and older. Getting tested for HPV can extend the interval between normal Pap tests from three to five years.  An HPV test also should be used to screen women of any age who have unclear Pap test results.  After 73 years of age, women should have HPV testing as often as Pap tests.  Colorectal Cancer  This type of cancer can be detected and often prevented.  Routine colorectal cancer screening usually begins at 73 years of age and continues through 73 years of age.  Your health care provider may recommend screening at an earlier age if you have risk factors for colon cancer.  Your health care provider may also recommend using home test kits to check for hidden blood in the stool.  A small camera at the end of a tube can be used to examine your colon directly (sigmoidoscopy or colonoscopy). This is done to check for the earliest forms of colorectal cancer.  Routine screening usually begins at age 41.  Direct examination of the colon should be repeated every 5-10 years through 73 years of age. However, you may need to be screened more often if early forms of precancerous polyps or small growths are found. Skin Cancer  Check your skin from head to toe regularly.  Tell your  health care provider about any new moles or changes in moles, especially if there is a change in a mole's shape or color.  Also tell your health care provider if you have a mole that is larger than the size of a pencil eraser.  Always use sunscreen. Apply sunscreen liberally and repeatedly throughout the day.  Protect yourself by wearing long sleeves, pants, a wide-brimmed hat, and sunglasses whenever you are outside. HEART DISEASE, DIABETES, AND HIGH BLOOD PRESSURE   Have your blood pressure checked at least every 1-2 years. High blood pressure causes heart disease and increases the risk of stroke.  If you are between 55 years and 45 years old, ask your health care provider if you should take aspirin to prevent strokes.  Have regular diabetes screenings. This involves taking a blood sample to check your fasting blood sugar level.  If you are at a normal weight and have a low risk for diabetes, have this test once every three years after 73 years of age.  If you are overweight and have a high risk for diabetes, consider being tested at a younger age or more often. PREVENTING INFECTION  Hepatitis B  If you have a higher risk for hepatitis B, you should be screened for this virus. You are considered at high risk for hepatitis B if:  You were born in a country where hepatitis B is common. Ask your health care provider which countries are considered high risk.  Your parents were born in a high-risk country, and you have not been immunized against hepatitis B (hepatitis B vaccine).  You have HIV or AIDS.  You use needles to inject street drugs.  You live with someone who has hepatitis B.  You have had sex with someone who has hepatitis B.  You get hemodialysis treatment.  You take certain medicines for conditions, including cancer, organ transplantation, and autoimmune conditions. Hepatitis C  Blood testing is recommended for:  Everyone born from [birth year] through 1964.  Anyone with  known risk factors for hepatitis C. Sexually transmitted infections (STIs)  You should be screened for sexually transmitted infections (STIs)  including gonorrhea and chlamydia if:  You are sexually active and are younger than 73 years of age.  You are older than 73 years of age and your health care provider tells you that you are at risk for this type of infection.  Your sexual activity has changed since you were last screened and you are at an increased risk for chlamydia or gonorrhea. Ask your health care provider if you are at risk.  If you do not have HIV, but are at risk, it may be recommended that you take a prescription medicine daily to prevent HIV infection. This is called pre-exposure prophylaxis (PrEP). You are considered at risk if:  You are sexually active and do not regularly use condoms or know the HIV status of your partner(s).  You take drugs by injection.  You are sexually active with a partner who has HIV. Talk with your health care provider about whether you are at high risk of being infected with HIV. If you choose to begin PrEP, you should first be tested for HIV. You should then be tested every 3 months for as long as you are taking PrEP.  PREGNANCY   If you are premenopausal and you may become pregnant, ask your health care provider about preconception counseling.  If you may become pregnant, take 400 to 800 micrograms (mcg) of folic acid every day.  If you want to prevent pregnancy, talk to your health care provider about birth control (contraception). OSTEOPOROSIS AND MENOPAUSE   Osteoporosis is a disease in which the bones lose minerals and strength with aging. This can result in serious bone fractures. Your risk for osteoporosis can be identified using a bone density scan.  If you are 56 years of age or older, or if you are at risk for osteoporosis and fractures, ask your health care provider if you should be screened.  Ask your health care provider whether  you should take a calcium or vitamin D supplement to lower your risk for osteoporosis.  Menopause may have certain physical symptoms and risks.  Hormone replacement therapy may reduce some of these symptoms and risks. Talk to your health care provider about whether hormone replacement therapy is right for you.  HOME CARE INSTRUCTIONS   Schedule regular health, dental, and eye exams.  Stay current with your immunizations.   Do not use any tobacco products including cigarettes, chewing tobacco, or electronic cigarettes.  If you are pregnant, do not drink alcohol.  If you are breastfeeding, limit how much and how often you drink alcohol.  Limit alcohol intake to no more than 1 drink per day for nonpregnant women. One drink equals 12 ounces of beer, 5 ounces of wine, or 1 ounces of hard liquor.  Do not use street drugs.  Do not share needles.  Ask your health care provider for help if you need support or information about quitting drugs.  Tell your health care provider if you often feel depressed.  Tell your health care provider if you have ever been abused or do not feel safe at home. Document Released: 04/08/2011 Document Revised: 02/07/2014 Document Reviewed: 08/25/2013 [facility] Patient Information 2015 [place], [place]. This information is not intended to replace advice given to you by your health care provider. Make sure you discuss any questions you have with your health care provider.

## 2015-01-03 NOTE — Progress Notes (Signed)
Subjective:    Patient ID: Stacey [name], female    DOB: 11/20/1940, 74 y.o.   MRN: [id number]  Hypertension This is a chronic problem. The current episode started more than 1 year ago. The problem has been waxing and waning since onset. The problem is uncontrolled. Associated symptoms include peripheral edema. Pertinent negatives include no anxiety, headaches, palpitations or shortness of breath. Risk factors for coronary artery disease include obesity, post-menopausal state, sedentary lifestyle, family history and dyslipidemia. Past treatments include diuretics (Pt has been out of her medication for over a week now). There is no history of kidney disease, CAD/MI, CVA, heart failure or a thyroid problem. There is no history of sleep apnea.  Gastrophageal Reflux She reports no belching, no coughing, no heartburn, no sore throat or no wheezing. This is a chronic problem. The current episode started more than 1 year ago. The problem occurs rarely. The symptoms are aggravated by certain foods. Pertinent negatives include no fatigue. She has tried a PPI for the symptoms. The treatment provided moderate relief.      Review of Systems  Constitutional: Negative.  Negative for fatigue.  HENT: Negative.  Negative for sore throat.   Eyes: Negative.   Respiratory: Negative.  Negative for cough, shortness of breath and wheezing.   Cardiovascular: Negative.  Negative for palpitations.  Gastrointestinal: Negative.  Negative for heartburn.  Endocrine: Negative.   Genitourinary: Negative.   Musculoskeletal: Positive for arthralgias.  Neurological: Negative.  Negative for headaches.  Hematological: Negative.   Psychiatric/Behavioral: Negative.   All other systems reviewed and are negative.      Objective:   Physical Exam  Constitutional: She is oriented to person, place, and time. She appears well-developed and well-nourished. No distress.  HENT:  Head: Normocephalic and atraumatic.  Right Ear:  External ear normal.  Left Ear: External ear normal.  Nose: Nose normal.  Mouth/Throat: Oropharynx is clear and moist.  Eyes: Pupils are equal, round, and reactive to light.  Neck: Normal range of motion. Neck supple. No thyromegaly present.  Cardiovascular: Normal rate, regular rhythm, normal heart sounds and intact distal pulses.   No murmur heard. Pulmonary/Chest: Effort normal and breath sounds normal. No respiratory distress. She has no wheezes.  Abdominal: Soft. Bowel sounds are normal. She exhibits no distension. There is no tenderness.  Musculoskeletal: Normal range of motion. She exhibits no edema or tenderness.  Neurological: She is alert and oriented to person, place, and time. She has normal reflexes. No cranial nerve deficit.  Skin: Skin is warm and dry.  Psychiatric: She has a normal mood and affect. Her behavior is normal. Judgment and thought content normal.  Vitals reviewed.   BP 152/87 mmHg  Pulse 84  Temp(Src) 96.8 F (36 C) (Oral)  Ht _0  (1.6 m)  Wt 198 lb 6.4 oz (89.994 kg)  BMI 35.15 kg/m2       Assessment & Plan:  1. Essential hypertension -Pt's HTCZ reordered- Pt's rx had been out for over a weeks -Dash diet information given -Exercise encouraged - Stress Management  -Continue current meds -RTO in 2 weeks   CMP14+EGFR - hydrochlorothiazide (HYDRODIURIL) 25 MG tablet; Take 1 tablet (25 mg total) by mouth daily.  Dispense: 90 tablet; Refill: 3  2. Gastroesophageal reflux disease with esophagitis - CMP14+EGFR  3. Arthritis -No other NSAID's -Take with Food - Arthritis Panel - meloxicam (MOBIC) 15 MG tablet; Take 1 tablet (15 mg total) by mouth daily.  Dispense: 30 tablet; Refill: 3  4. Encounter for vitamin deficiency screening - Vit D  25 hydroxy (rtn osteoporosis monitoring)  5. Peripheral edema Keep feet elevated -RTO in two weeks -Take the lasix as needed for fluid retention - Brain natriuretic peptide - furosemide (LASIX) 20 MG  tablet; Take 1 tablet (20 mg total) by mouth daily.  Dispense: 30 tablet; Refill: 3   Continue all meds Labs pending Health Maintenance reviewed- Pt refuses all vaccines at this time Diet and exercise encouraged RTO 2 weeks to recheck HTN and Edema  Evelina Dun, FNP

## 2015-01-04 ENCOUNTER — Other Ambulatory Visit: Payer: Self-pay | Admitting: Family

## 2015-01-04 DIAGNOSIS — E559 Vitamin D deficiency, unspecified: Secondary | ICD-10-CM | POA: Insufficient documentation

## 2015-01-04 LAB — ARTHRITIS PANEL
BASOS: 1 %
Basophils Absolute: 0 10*3/uL (ref 0.0–0.2)
Eos: 2 %
Eosinophils Absolute: 0.1 10*3/uL (ref 0.0–0.4)
HEMATOCRIT: 36.4 % (ref 34.0–46.6)
HEMOGLOBIN: 11.9 g/dL (ref 11.1–15.9)
IMMATURE GRANS (ABS): 0 10*3/uL (ref 0.0–0.1)
Immature Granulocytes: 0 %
Lymphocytes Absolute: 2.4 10*3/uL (ref 0.7–3.1)
Lymphs: 32 %
MCH: 29.1 pg (ref 26.6–33.0)
MCHC: 32.7 g/dL (ref 31.5–35.7)
MCV: 89 fL (ref 79–97)
MONOS ABS: 0.4 10*3/uL (ref 0.1–0.9)
Monocytes: 5 %
NEUTROS ABS: 4.6 10*3/uL (ref 1.4–7.0)
NEUTROS PCT: 60 %
Platelets: 239 10*3/uL (ref 150–379)
RBC: 4.09 x10E6/uL (ref 3.77–5.28)
RDW: 14.6 % (ref 12.3–15.4)
RHEUMATOID FACTOR: 9.6 [IU]/mL (ref 0.0–13.9)
Sed Rate: 32 mm/hr (ref 0–40)
Uric Acid: 5.7 mg/dL (ref 2.5–7.1)
WBC: 7.6 10*3/uL (ref 3.4–10.8)

## 2015-01-04 LAB — CMP14+EGFR
ALT: 19 IU/L (ref 0–32)
AST: 19 IU/L (ref 0–40)
Albumin/Globulin Ratio: 1.4 (ref 1.1–2.5)
Albumin: 4.2 g/dL (ref 3.5–4.8)
Alkaline Phosphatase: 65 IU/L (ref 39–117)
BUN/Creatinine Ratio: 21 (ref 11–26)
BUN: 20 mg/dL (ref 8–27)
Bilirubin Total: <0.2 mg/dL (ref 0.0–1.2)
CALCIUM: 9.3 mg/dL (ref 8.7–10.3)
CO2: 26 mmol/L (ref 18–29)
Chloride: 102 mmol/L (ref 97–108)
Creatinine, Ser: 0.95 mg/dL (ref 0.57–1.00)
GFR calc Af Amer: 69 mL/min/{1.73_m2} (ref >59)
GFR calc non Af Amer: 60 mL/min/{1.73_m2} (ref >59)
GLOBULIN, TOTAL: 2.9 g/dL (ref 1.5–4.5)
Glucose: 106 mg/dL — ABNORMAL HIGH (ref 65–99)
Potassium: 4.1 mmol/L (ref 3.5–5.2)
Sodium: 142 mmol/L (ref 134–144)
TOTAL PROTEIN: 7.1 g/dL (ref 6.0–8.5)

## 2015-01-04 LAB — BRAIN NATRIURETIC PEPTIDE: BNP: 127.7 pg/mL — AB (ref 0.0–100.0)

## 2015-01-04 LAB — VITAMIN D 25 HYDROXY (VIT D DEFICIENCY, FRACTURES): Vit D, 25-Hydroxy: 28.8 ng/mL — ABNORMAL LOW (ref 30.0–100.0)

## 2015-01-04 MED ORDER — VITAMIN D (ERGOCALCIFEROL) 1.25 MG (50000 UNIT) PO CAPS: 50000.0000 [IU] | ORAL_CAPSULE | ORAL | Status: DC

## 2015-01-17 ENCOUNTER — Encounter: Payer: Self-pay | Admitting: Family

## 2015-01-17 ENCOUNTER — Ambulatory Visit (INDEPENDENT_AMBULATORY_CARE_PROVIDER_SITE_OTHER): Payer: Medicare Other | Admitting: Family

## 2015-01-17 VITALS — BP 128/84 | HR 96 | Temp 97.2°F | Ht 63.0 in | Wt 192.6 lb

## 2015-01-17 DIAGNOSIS — R609 Edema, unspecified: Secondary | ICD-10-CM | POA: Diagnosis not present

## 2015-01-17 DIAGNOSIS — R600 Localized edema: Secondary | ICD-10-CM

## 2015-01-17 DIAGNOSIS — I10 Essential (primary) hypertension: Secondary | ICD-10-CM | POA: Diagnosis not present

## 2015-01-17 NOTE — Patient Instructions (Signed)
DASH Eating Plan DASH stands for "Dietary Approaches to Stop Hypertension." The DASH eating plan is a healthy eating plan that has been shown to reduce high blood pressure (hypertension). Additional health benefits may include reducing the risk of type 2 diabetes mellitus, heart disease, and stroke. The DASH eating plan may also help with weight loss. WHAT DO I NEED TO KNOW ABOUT THE DASH EATING PLAN? For the DASH eating plan, you will follow these general guidelines:  Choose foods with a percent daily value for sodium of less than 5% (as listed on the food label).  Use salt-free seasonings or herbs instead of table salt or sea salt.  Check with your health care provider or pharmacist before using salt substitutes.  Eat lower-sodium products, often labeled as "lower sodium" or "no salt added."  Eat fresh foods.  Eat more vegetables, fruits, and low-fat dairy products.  Choose whole grains. Look for the word "whole" as the first word in the ingredient list.  Choose fish and skinless chicken or turkey more often than red meat. Limit fish, poultry, and meat to 6 oz (170 g) each day.  Limit sweets, desserts, sugars, and sugary drinks.  Choose heart-healthy fats.  Limit cheese to 1 oz (28 g) per day.  Eat more home-cooked food and less restaurant, buffet, and fast food.  Limit fried foods.  Cook foods using methods other than frying.  Limit canned vegetables. If you do use them, rinse them well to decrease the sodium.  When eating at a restaurant, ask that your food be prepared with less salt, or no salt if possible. WHAT FOODS CAN I EAT? Seek help from a dietitian for individual calorie needs. Grains Whole grain or whole wheat bread. Brown rice. Whole grain or whole wheat pasta. Quinoa, bulgur, and whole grain cereals. Low-sodium cereals. Corn or whole wheat flour tortillas. Whole grain cornbread. Whole grain crackers. Low-sodium crackers. Vegetables Fresh or frozen vegetables  (raw, steamed, roasted, or grilled). Low-sodium or reduced-sodium tomato and vegetable juices. Low-sodium or reduced-sodium tomato sauce and paste. Low-sodium or reduced-sodium canned vegetables.  Fruits All fresh, canned (in natural juice), or frozen fruits. Meat and Other Protein Products Ground beef (85% or leaner), grass-fed beef, or beef trimmed of fat. Skinless chicken or turkey. Ground chicken or turkey. Pork trimmed of fat. All fish and seafood. Eggs. Dried beans, peas, or lentils. Unsalted nuts and seeds. Unsalted canned beans. Dairy Low-fat dairy products, such as skim or 1% milk, 2% or reduced-fat cheeses, low-fat ricotta or cottage cheese, or plain low-fat yogurt. Low-sodium or reduced-sodium cheeses. Fats and Oils Tub margarines without trans fats. Light or reduced-fat mayonnaise and salad dressings (reduced sodium). Avocado. Safflower, olive, or canola oils. Natural peanut or almond butter. Other Unsalted popcorn and pretzels. The items listed above may not be a complete list of recommended foods or beverages. Contact your dietitian for more options. WHAT FOODS ARE NOT RECOMMENDED? Grains White bread. White pasta. White rice. Refined cornbread. Bagels and croissants. Crackers that contain trans fat. Vegetables Creamed or fried vegetables. Vegetables in a cheese sauce. Regular canned vegetables. Regular canned tomato sauce and paste. Regular tomato and vegetable juices. Fruits Dried fruits. Canned fruit in light or heavy syrup. Fruit juice. Meat and Other Protein Products Fatty cuts of meat. Ribs, chicken wings, bacon, sausage, bologna, salami, chitterlings, fatback, hot dogs, bratwurst, and packaged luncheon meats. Salted nuts and seeds. Canned beans with salt. Dairy Whole or 2% milk, cream, half-and-half, and cream cheese. Whole-fat or sweetened yogurt. Full-fat   cheeses or blue cheese. Nondairy creamers and whipped toppings. Processed cheese, cheese spreads, or cheese  curds. Condiments Onion and garlic salt, seasoned salt, table salt, and sea salt. Canned and packaged gravies. Worcestershire sauce. Tartar sauce. Barbecue sauce. Teriyaki sauce. Soy sauce, including reduced sodium. Steak sauce. Fish sauce. Oyster sauce. Cocktail sauce. Horseradish. Ketchup and mustard. Meat flavorings and tenderizers. Bouillon cubes. Hot sauce. Tabasco sauce. Marinades. Taco seasonings. Relishes. Fats and Oils Butter, stick margarine, lard, shortening, ghee, and bacon fat. Coconut, palm kernel, or palm oils. Regular salad dressings. Other Pickles and olives. Salted popcorn and pretzels. The items listed above may not be a complete list of foods and beverages to avoid. Contact your dietitian for more information. WHERE CAN I FIND MORE INFORMATION? National Heart, [employer], [employer]: www.nhlbi.nih.gov/health/health-topics/topics/dash/ Document Released: 09/12/2011 Document Revised: 02/07/2014 Document Reviewed: 07/28/2013 ExitCare Patient Information 2015 [employer]. This information is not intended to replace advice given to you by your health care provider. Make sure you discuss any questions you have with your health care provider. Hypertension Hypertension, commonly called high blood pressure, is when the force of blood pumping through your arteries is too strong. Your arteries are the blood vessels that carry blood from your heart throughout your body. A blood pressure reading consists of a higher number over a lower number, such as 110/72. The higher number (systolic) is the pressure inside your arteries when your heart pumps. The lower number (diastolic) is the pressure inside your arteries when your heart relaxes. Ideally you want your blood pressure below 120/80. Hypertension forces your heart to work harder to pump blood. Your arteries may become narrow or stiff. Having hypertension puts you at risk for heart disease, stroke, and other problems.  RISK  FACTORS Some risk factors for high blood pressure are controllable. Others are not.  Risk factors you cannot control include:   Race. You may be at higher risk if you are African American.  Age. Risk increases with age.  Gender. Men are at higher risk than women before age 45 years. After age 65, women are at higher risk than men. Risk factors you can control include:  Not getting enough exercise or physical activity.  Being overweight.  Getting too much fat, sugar, calories, or salt in your diet.  Drinking too much alcohol. SIGNS AND SYMPTOMS Hypertension does not usually cause signs or symptoms. Extremely high blood pressure (hypertensive crisis) may cause headache, anxiety, shortness of breath, and nosebleed. DIAGNOSIS  To check if you have hypertension, your health care provider will measure your blood pressure while you are seated, with your arm held at the level of your heart. It should be measured at least twice using the same arm. Certain conditions can cause a difference in blood pressure between your right and left arms. A blood pressure reading that is higher than normal on one occasion does not mean that you need treatment. If one blood pressure reading is high, ask your health care provider about having it checked again. TREATMENT  Treating high blood pressure includes making lifestyle changes and possibly taking medicine. Living a healthy lifestyle can help lower high blood pressure. You may need to change some of your habits. Lifestyle changes may include:  Following the DASH diet. This diet is high in fruits, vegetables, and whole grains. It is low in salt, red meat, and added sugars.  Getting at least 2 hours of brisk physical activity every week.  Losing weight if necessary.  Not smoking.  Limiting   alcoholic beverages.  Learning ways to reduce stress. If lifestyle changes are not enough to get your blood pressure under control, your health care provider may  prescribe medicine. You may need to take more than one. Work closely with your health care provider to understand the risks and benefits. HOME CARE INSTRUCTIONS  Have your blood pressure rechecked as directed by your health care provider.   Take medicines only as directed by your health care provider. Follow the directions carefully. Blood pressure medicines must be taken as prescribed. The medicine does not work as well when you skip doses. Skipping doses also puts you at risk for problems.   Do not smoke.   Monitor your blood pressure at home as directed by your health care provider. SEEK MEDICAL CARE IF:   You think you are having a reaction to medicines taken.  You have recurrent headaches or feel dizzy.  You have swelling in your ankles.  You have trouble with your vision. SEEK IMMEDIATE MEDICAL CARE IF:  You develop a severe headache or confusion.  You have unusual weakness, numbness, or feel faint.  You have severe chest or abdominal pain.  You vomit repeatedly.  You have trouble breathing. MAKE SURE YOU:   Understand these instructions.  Will watch your condition.  Will get help right away if you are not doing well or get worse. Document Released: 09/23/2005 Document Revised: 02/07/2014 Document Reviewed: 07/16/2013 ExitCare Patient Information 2015 [employer]. This information is not intended to replace advice given to you by your health care provider. Make sure you discuss any questions you have with your health care provider.  

## 2015-01-17 NOTE — Progress Notes (Signed)
   Subjective:    Patient ID: Stacey [name], female    DOB: October 25, 1940, 74 y.o.   MRN: [id number]  HPI Pt presents to the office to recheck her BP and edema in BLE. Pt's BP is at goal today! Pt is currently taking HCTZ 25 mg daily and lasix as needed for fluid in BLE. Pt states she has only had to take the lasix a "few" times. Pt states she is feeling 'so much better". Pt denies any headache, palpitations, SOB, or edema at this time.     Review of Systems  Constitutional: Negative.   HENT: Negative.   Eyes: Negative.   Respiratory: Negative.  Negative for shortness of breath.   Cardiovascular: Negative.  Negative for palpitations.  Gastrointestinal: Negative.   Endocrine: Negative.   Genitourinary: Negative.   Musculoskeletal: Negative.   Neurological: Negative.  Negative for headaches.  Hematological: Negative.   Psychiatric/Behavioral: Negative.   All other systems reviewed and are negative.      Objective:   Physical Exam  Constitutional: She is oriented to person, place, and time. She appears well-developed and well-nourished. No distress.  HENT:  Head: Normocephalic and atraumatic.  Right Ear: External ear normal.  Left Ear: External ear normal.  Nose: Nose normal.  Mouth/Throat: Oropharynx is clear and moist.  Eyes: Pupils are equal, round, and reactive to light.  Neck: Normal range of motion. Neck supple. No thyromegaly present.  Cardiovascular: Normal rate, regular rhythm, normal heart sounds and intact distal pulses.   No murmur heard. Pulmonary/Chest: Effort normal and breath sounds normal. No respiratory distress. She has no wheezes.  Abdominal: Soft. Bowel sounds are normal. She exhibits no distension. There is no tenderness.  Musculoskeletal: Normal range of motion. She exhibits no edema or tenderness.  Neurological: She is alert and oriented to person, place, and time. She has normal reflexes. No cranial nerve deficit.  Skin: Skin is warm and dry.    Psychiatric: She has a normal mood and affect. Her behavior is normal. Judgment and thought content normal.  Vitals reviewed.   BP 128/84 mmHg  Pulse 96  Temp(Src) 97.2 F (36.2 C) (Oral)  Ht _0  (1.6 m)  Wt 192 lb 9.6 oz (87.363 kg)  BMI 34.13 kg/m2       Assessment & Plan:  1. Bilateral lower extremity edema - BMP8+EGFR  2. Essential hypertension - BMP8+EGFR  -Dash diet information given -Exercise encouraged - Stress Management  -Continue current meds -RTO in 6 months for chronic follow up  Evelina Dun, FNP

## 2015-01-18 LAB — BMP8+EGFR
BUN/Creatinine Ratio: 23 (ref 11–26)
BUN: 20 mg/dL (ref 8–27)
CALCIUM: 9.4 mg/dL (ref 8.7–10.3)
CHLORIDE: 97 mmol/L (ref 97–108)
CO2: 27 mmol/L (ref 18–29)
Creatinine, Ser: 0.87 mg/dL (ref 0.57–1.00)
GFR calc Af Amer: 77 mL/min/{1.73_m2} (ref >59)
GFR calc non Af Amer: 67 mL/min/{1.73_m2} (ref >59)
GLUCOSE: 110 mg/dL — AB (ref 65–99)
Potassium: 3.5 mmol/L (ref 3.5–5.2)
Sodium: 141 mmol/L (ref 134–144)

## 2015-03-21 ENCOUNTER — Encounter: Payer: Self-pay | Admitting: Family

## 2015-03-21 ENCOUNTER — Ambulatory Visit (INDEPENDENT_AMBULATORY_CARE_PROVIDER_SITE_OTHER): Payer: Medicare Other | Admitting: Family

## 2015-03-21 VITALS — BP 138/89 | HR 84 | Temp 97.0°F | Ht 63.0 in | Wt 193.2 lb

## 2015-03-21 DIAGNOSIS — J019 Acute sinusitis, unspecified: Secondary | ICD-10-CM

## 2015-03-21 MED ORDER — AMOXICILLIN-POT CLAVULANATE 875-125 MG PO TABS: 1.0000 | ORAL_TABLET | Freq: Two times a day (BID) | ORAL | Status: DC

## 2015-03-21 MED ORDER — FLUCONAZOLE 150 MG PO TABS: 150.0000 mg | ORAL_TABLET | Freq: Once | ORAL | Status: DC

## 2015-03-21 NOTE — Patient Instructions (Signed)
Sinusitis Sinusitis is redness, soreness, and inflammation of the paranasal sinuses. Paranasal sinuses are air pockets within the bones of your face (beneath the eyes, the middle of the forehead, or above the eyes). In healthy paranasal sinuses, mucus is able to drain out, and air is able to circulate through them by way of your nose. However, when your paranasal sinuses are inflamed, mucus and air can become trapped. This can allow bacteria and other germs to grow and cause infection. Sinusitis can develop quickly and last only a short time (acute) or continue over a long period (chronic). Sinusitis that lasts for more than 12 weeks is considered chronic.  CAUSES  Causes of sinusitis include:  Allergies.  Structural abnormalities, such as displacement of the cartilage that separates your nostrils (deviated septum), which can decrease the air flow through your nose and sinuses and affect sinus drainage.  Functional abnormalities, such as when the small hairs (cilia) that line your sinuses and help remove mucus do not work properly or are not present. SIGNS AND SYMPTOMS  Symptoms of acute and chronic sinusitis are the same. The primary symptoms are pain and pressure around the affected sinuses. Other symptoms include:  Upper toothache.  Earache.  Headache.  Bad breath.  Decreased sense of smell and taste.  A cough, which worsens when you are lying flat.  Fatigue.  Fever.  Thick drainage from your nose, which often is green and may contain pus (purulent).  Swelling and warmth over the affected sinuses. DIAGNOSIS  Your health care provider will perform a physical exam. During the exam, your health care provider may:  Look in your nose for signs of abnormal growths in your nostrils (nasal polyps).  Tap over the affected sinus to check for signs of infection.  View the inside of your sinuses (endoscopy) using an imaging device that has a light attached (endoscope). If your health  care provider suspects that you have chronic sinusitis, one or more of the following tests may be recommended:  Allergy tests.  Nasal culture. A sample of mucus is taken from your nose, sent to a lab, and screened for bacteria.  Nasal cytology. A sample of mucus is taken from your nose and examined by your health care provider to determine if your sinusitis is related to an allergy. TREATMENT  Most cases of acute sinusitis are related to a viral infection and will resolve on their own within 10 days. Sometimes medicines are prescribed to help relieve symptoms (pain medicine, decongestants, nasal steroid sprays, or saline sprays).  However, for sinusitis related to a bacterial infection, your health care provider will prescribe antibiotic medicines. These are medicines that will help kill the bacteria causing the infection.  Rarely, sinusitis is caused by a fungal infection. In theses cases, your health care provider will prescribe antifungal medicine. For some cases of chronic sinusitis, surgery is needed. Generally, these are cases in which sinusitis recurs more than 3 times per year, despite other treatments. HOME CARE INSTRUCTIONS   Drink plenty of water. Water helps thin the mucus so your sinuses can drain more easily.  Use a humidifier.  Inhale steam 3 to 4 times a day (for example, sit in the bathroom with the shower running).  Apply a warm, moist washcloth to your face 3 to 4 times a day, or as directed by your health care provider.  Use saline nasal sprays to help moisten and clean your sinuses.  Take medicines only as directed by your health care provider.    If you were prescribed either an antibiotic or antifungal medicine, finish it all even if you start to feel better. SEEK IMMEDIATE MEDICAL CARE IF:  You have increasing pain or severe headaches.  You have nausea, vomiting, or drowsiness.  You have swelling around your face.  You have vision problems.  You have a stiff  neck.  You have difficulty breathing. MAKE SURE YOU:   Understand these instructions.  Will watch your condition.  Will get help right away if you are not doing well or get worse. Document Released: 09/23/2005 Document Revised: 02/07/2014 Document Reviewed: 10/08/2011 ExitCare Patient Information 2015 [employer]. This information is not intended to replace advice given to you by your health care provider. Make sure you discuss any questions you have with your health care provider.  - Take meds as prescribed - Use a cool mist humidifier  -Use saline nose sprays frequently -Saline irrigations of the nose can be very helpful if done frequently.  * 4X daily for 1 week*  * Use of a nettie pot can be helpful with this. Follow directions with this* -Force fluids -For any cough or congestion  Use plain Mucinex- regular strength or max strength is fine   * Children- consult with Pharmacist for dosing -For fever or aces or pains- take tylenol or ibuprofen appropriate for age and weight.  * for fevers greater than 101 orally you may alternate ibuprofen and tylenol every  3 hours. -Throat lozenges if help   Maija Biggers, FNP  

## 2015-03-21 NOTE — Progress Notes (Signed)
   Subjective:    Patient ID: Stacey [name], female    DOB: Apr 13, 1941, 74 y.o.   MRN: [id number]  Sinus Problem This is a new problem. The current episode started in the past 7 days. The problem has been gradually worsening since onset. There has been no fever. Her pain is at a severity of 8/10. The pain is mild. Associated symptoms include chills, congestion, ear pain, headaches, a hoarse voice, sinus pressure and sneezing. Pertinent negatives include no coughing, shortness of breath or sore throat. Past treatments include lying down and oral decongestants. The treatment provided mild relief.      Review of Systems  Constitutional: Positive for chills.  HENT: Positive for congestion, ear pain, hoarse voice, sinus pressure and sneezing. Negative for sore throat.   Eyes: Negative.   Respiratory: Negative.  Negative for cough and shortness of breath.   Cardiovascular: Negative.  Negative for palpitations.  Gastrointestinal: Negative.   Endocrine: Negative.   Genitourinary: Negative.   Musculoskeletal: Negative.   Neurological: Positive for headaches.  Hematological: Negative.   Psychiatric/Behavioral: Negative.   All other systems reviewed and are negative.      Objective:   Physical Exam  Constitutional: She is oriented to person, place, and time. She appears well-developed and well-nourished. No distress.  HENT:  Head: Normocephalic and atraumatic.  Right Ear: External ear normal.  Left Ear: External ear normal.  Nose: Right sinus exhibits maxillary sinus tenderness and frontal sinus tenderness. Left sinus exhibits maxillary sinus tenderness and frontal sinus tenderness.  Nasal passage erythemas with mild swelling  Oropharynx erythemas   Eyes: Pupils are equal, round, and reactive to light.  Neck: Normal range of motion. Neck supple. No thyromegaly present.  Cardiovascular: Normal rate, regular rhythm, normal heart sounds and intact distal pulses.   No murmur  heard. Pulmonary/Chest: Effort normal and breath sounds normal. No respiratory distress. She has no wheezes.  Abdominal: Soft. Bowel sounds are normal. She exhibits no distension. There is no tenderness.  Musculoskeletal: Normal range of motion. She exhibits no edema or tenderness.  Neurological: She is alert and oriented to person, place, and time. She has normal reflexes. No cranial nerve deficit.  Skin: Skin is warm and dry.  Psychiatric: She has a normal mood and affect. Her behavior is normal. Judgment and thought content normal.  Vitals reviewed.     BP 138/89 mmHg  Pulse 84  Temp(Src) 97 F (36.1 C) (Oral)  Ht 5\' 3"  (1.6 m)  Wt 193 lb 3.2 oz (87.635 kg)  BMI 34.23 kg/m2     Assessment & Plan:  1. Acute sinusitis, recurrence not specified, unspecified location - Take meds as prescribed - Use a cool mist humidifier  -Use saline nose sprays frequently -Saline irrigations of the nose can be very helpful if done frequently.  * 4X daily for 1 week*  * Use of a nettie pot can be helpful with this. Follow directions with this* -Force fluids -For any cough or congestion  Use plain Mucinex- regular strength or max strength is fine   * Children- consult with Pharmacist for dosing -For fever or aces or pains- take tylenol or ibuprofen appropriate for age and weight.  * for fevers greater than 101 orally you may alternate ibuprofen and tylenol every  3 hours. -Throat lozenges if help - amoxicillin-clavulanate (AUGMENTIN) 875-125 MG per tablet; Take 1 tablet by mouth 2 (two) times daily.  Dispense: 14 tablet; Refill: 0  Jannifer Rodney, FNP

## 2015-03-23 ENCOUNTER — Telehealth: Payer: Self-pay | Admitting: Family

## 2015-03-23 NOTE — Telephone Encounter (Signed)
Please advise. You gave patient augmentin. Route to [facility]

## 2015-03-24 MED ORDER — AZITHROMYCIN 250 MG PO TABS: ORAL_TABLET | ORAL | Status: DC

## 2015-03-24 NOTE — Telephone Encounter (Signed)
Prescription sent to pharmacy.

## 2015-06-05 ENCOUNTER — Other Ambulatory Visit: Payer: Self-pay | Admitting: Family

## 2015-06-08 ENCOUNTER — Encounter: Payer: Self-pay | Admitting: Family

## 2015-06-08 ENCOUNTER — Ambulatory Visit (INDEPENDENT_AMBULATORY_CARE_PROVIDER_SITE_OTHER): Payer: Medicare Other | Admitting: Family

## 2015-06-08 VITALS — BP 137/82 | HR 88 | Temp 97.3°F | Ht 63.0 in | Wt 194.4 lb

## 2015-06-08 DIAGNOSIS — J309 Allergic rhinitis, unspecified: Secondary | ICD-10-CM | POA: Diagnosis not present

## 2015-06-08 DIAGNOSIS — J0101 Acute recurrent maxillary sinusitis: Secondary | ICD-10-CM

## 2015-06-08 DIAGNOSIS — R5383 Other fatigue: Secondary | ICD-10-CM

## 2015-06-08 MED ORDER — AZITHROMYCIN 250 MG PO TABS: ORAL_TABLET | ORAL | Status: DC

## 2015-06-08 MED ORDER — FLUTICASONE PROPIONATE 50 MCG/ACT NA SUSP: 2.0000 | Freq: Every day | NASAL | Status: DC

## 2015-06-08 NOTE — Patient Instructions (Signed)
Sinusitis Sinusitis is redness, soreness, and inflammation of the paranasal sinuses. Paranasal sinuses are air pockets within the bones of your face (beneath the eyes, the middle of the forehead, or above the eyes). In healthy paranasal sinuses, mucus is able to drain out, and air is able to circulate through them by way of your nose. However, when your paranasal sinuses are inflamed, mucus and air can become trapped. This can allow bacteria and other germs to grow and cause infection. Sinusitis can develop quickly and last only a short time (acute) or continue over a long period (chronic). Sinusitis that lasts for more than 12 weeks is considered chronic.  CAUSES  Causes of sinusitis include:  Allergies.  Structural abnormalities, such as displacement of the cartilage that separates your nostrils (deviated septum), which can decrease the air flow through your nose and sinuses and affect sinus drainage.  Functional abnormalities, such as when the small hairs (cilia) that line your sinuses and help remove mucus do not work properly or are not present. SIGNS AND SYMPTOMS  Symptoms of acute and chronic sinusitis are the same. The primary symptoms are pain and pressure around the affected sinuses. Other symptoms include:  Upper toothache.  Earache.  Headache.  Bad breath.  Decreased sense of smell and taste.  A cough, which worsens when you are lying flat.  Fatigue.  Fever.  Thick drainage from your nose, which often is green and may contain pus (purulent).  Swelling and warmth over the affected sinuses. DIAGNOSIS  Your health care provider will perform a physical exam. During the exam, your health care provider may:  Look in your nose for signs of abnormal growths in your nostrils (nasal polyps).  Tap over the affected sinus to check for signs of infection.  View the inside of your sinuses (endoscopy) using an imaging device that has a light attached (endoscope). If your health  care provider suspects that you have chronic sinusitis, one or more of the following tests may be recommended:  Allergy tests.  Nasal culture. A sample of mucus is taken from your nose, sent to a lab, and screened for bacteria.  Nasal cytology. A sample of mucus is taken from your nose and examined by your health care provider to determine if your sinusitis is related to an allergy. TREATMENT  Most cases of acute sinusitis are related to a viral infection and will resolve on their own within 10 days. Sometimes medicines are prescribed to help relieve symptoms (pain medicine, decongestants, nasal steroid sprays, or saline sprays).  However, for sinusitis related to a bacterial infection, your health care provider will prescribe antibiotic medicines. These are medicines that will help kill the bacteria causing the infection.  Rarely, sinusitis is caused by a fungal infection. In theses cases, your health care provider will prescribe antifungal medicine. For some cases of chronic sinusitis, surgery is needed. Generally, these are cases in which sinusitis recurs more than 3 times per year, despite other treatments. HOME CARE INSTRUCTIONS   Drink plenty of water. Water helps thin the mucus so your sinuses can drain more easily.  Use a humidifier.  Inhale steam 3 to 4 times a day (for example, sit in the bathroom with the shower running).  Apply a warm, moist washcloth to your face 3 to 4 times a day, or as directed by your health care provider.  Use saline nasal sprays to help moisten and clean your sinuses.  Take medicines only as directed by your health care provider.    If you were prescribed either an antibiotic or antifungal medicine, finish it all even if you start to feel better. SEEK IMMEDIATE MEDICAL CARE IF:  You have increasing pain or severe headaches.  You have nausea, vomiting, or drowsiness.  You have swelling around your face.  You have vision problems.  You have a stiff  neck.  You have difficulty breathing. MAKE SURE YOU:   Understand these instructions.  Will watch your condition.  Will get help right away if you are not doing well or get worse. Document Released: 09/23/2005 Document Revised: 02/07/2014 Document Reviewed: 10/08/2011 [employer] Patient Information 2015 [employer]. This information is not intended to replace advice given to you by your health care provider. Make sure you discuss any questions you have with your health care provider.  - Take meds as prescribed - Use a cool mist humidifier  -Use saline nose sprays frequently -Saline irrigations of the nose can be very helpful if done frequently.  * 4X daily for 1 week*  * Use of a nettie pot can be helpful with this. Follow directions with this* -Force fluids -For any cough or congestion  Use plain Mucinex- regular strength or max strength is fine   * Children- consult with Pharmacist for dosing -For fever or aces or pains- take tylenol or ibuprofen appropriate for age and weight.  * for fevers greater than 101 orally you may alternate ibuprofen and tylenol every  3 hours. -Throat lozenges if help   Elyon Zoll, FNP  

## 2015-06-08 NOTE — Progress Notes (Signed)
Subjective:    Patient ID: Stacey [name], female    DOB: 04-01-1941, 74 y.o.   MRN: [id number]  Sinus Problem This is a recurrent problem. The current episode started more than 1 month ago. The problem has been waxing and waning since onset. There has been no fever. Her pain is at a severity of 8/10. The pain is moderate. Associated symptoms include congestion, coughing, ear pain, headaches, a hoarse voice and sinus pressure. Pertinent negatives include no neck pain, shortness of breath, sneezing or sore throat. Past treatments include oral decongestants and antibiotics. The treatment provided mild relief.      Review of Systems  Constitutional: Negative.   HENT: Positive for congestion, ear pain, hoarse voice and sinus pressure. Negative for sneezing and sore throat.   Eyes: Negative.   Respiratory: Positive for cough. Negative for shortness of breath.   Cardiovascular: Negative.  Negative for palpitations.  Gastrointestinal: Negative.   Endocrine: Negative.   Genitourinary: Negative.   Musculoskeletal: Negative.  Negative for neck pain.  Neurological: Positive for headaches.  Hematological: Negative.   Psychiatric/Behavioral: Negative.   All other systems reviewed and are negative.      Objective:   Physical Exam  Constitutional: She is oriented to person, place, and time. She appears well-developed and well-nourished. No distress.  HENT:  Head: Normocephalic and atraumatic.  Right Ear: External ear normal.  Left Ear: External ear normal.  Nose: Right sinus exhibits maxillary sinus tenderness. Right sinus exhibits no frontal sinus tenderness. Left sinus exhibits maxillary sinus tenderness. Left sinus exhibits no frontal sinus tenderness.  Mouth/Throat: Oropharynx is clear and moist.  Nasal passage erythemas with mild swelling  Oropharynx erythemas- left tonsil enlarged and erythemas- no exudate noted  Eyes: Pupils are equal, round, and reactive to light.  Neck: Normal  range of motion. Neck supple. No thyromegaly present.  Cardiovascular: Normal rate, regular rhythm, normal heart sounds and intact distal pulses.   No murmur heard. Pulmonary/Chest: Effort normal and breath sounds normal. No respiratory distress. She has no wheezes.  Abdominal: Soft. Bowel sounds are normal. She exhibits no distension. There is no tenderness.  Musculoskeletal: Normal range of motion. She exhibits no edema or tenderness.  Neurological: She is alert and oriented to person, place, and time. She has normal reflexes. No cranial nerve deficit.  Skin: Skin is warm and dry.  Psychiatric: She has a normal mood and affect. Her behavior is normal. Judgment and thought content normal.  Vitals reviewed.     BP 137/82 mmHg  Pulse 88  Temp(Src) 97.3 F (36.3 C) (Oral)  Ht '5\' 3"'  (1.6 m)  Wt 194 lb 6.4 oz (88.179 kg)  BMI 34.44 kg/m2     Assessment & Plan:  1. Acute recurrent maxillary sinusitis - Take meds as prescribed - Use a cool mist humidifier  -Use saline nose sprays frequently -Saline irrigations of the nose can be very helpful if done frequently.  * 4X daily for 1 week*  * Use of a nettie pot can be helpful with this. Follow directions with this* -Force fluids -For any cough or congestion  Use plain Mucinex- regular strength or max strength is fine   * Children- consult with Pharmacist for dosing -For fever or aces or pains- take tylenol or ibuprofen appropriate for age and weight.  * for fevers greater than 101 orally you may alternate ibuprofen and tylenol every  3 hours. -Throat lozenges if help - azithromycin (ZITHROMAX) 250 MG tablet; Take 500 mg once,  then 250 mg for four days  Dispense: 6 tablet; Refill: 0 - BMP8+EGFR  2. Other fatigue - BMP8+EGFR  3. Allergic rhinitis, unspecified allergic rhinitis type - BMP8+EGFR - fluticasone (FLONASE) 50 MCG/ACT nasal spray; Place 2 sprays into both nostrils daily.  Dispense: 16 g; Refill: [place], FNP

## 2015-06-09 ENCOUNTER — Other Ambulatory Visit: Payer: Self-pay | Admitting: Family

## 2015-06-09 LAB — BMP8+EGFR
BUN/Creatinine Ratio: 15 (ref 11–26)
BUN: 13 mg/dL (ref 8–27)
CALCIUM: 9.7 mg/dL (ref 8.7–10.3)
CO2: 31 mmol/L — ABNORMAL HIGH (ref 18–29)
Chloride: 93 mmol/L — ABNORMAL LOW (ref 97–108)
Creatinine, Ser: 0.86 mg/dL (ref 0.57–1.00)
GFR calc Af Amer: 78 mL/min/{1.73_m2} (ref >59)
GFR, EST NON AFRICAN AMERICAN: 68 mL/min/{1.73_m2} (ref >59)
GLUCOSE: 92 mg/dL (ref 65–99)
Potassium: 3.3 mmol/L — ABNORMAL LOW (ref 3.5–5.2)
Sodium: 139 mmol/L (ref 134–144)

## 2015-06-09 MED ORDER — POTASSIUM CHLORIDE ER 10 MEQ PO TBCR: 10.0000 meq | EXTENDED_RELEASE_TABLET | Freq: Every day | ORAL | Status: DC

## 2015-07-04 DIAGNOSIS — Z23 Encounter for immunization: Secondary | ICD-10-CM | POA: Diagnosis not present

## 2015-07-25 ENCOUNTER — Ambulatory Visit (INDEPENDENT_AMBULATORY_CARE_PROVIDER_SITE_OTHER): Payer: Medicare Other | Admitting: Family

## 2015-07-25 ENCOUNTER — Encounter: Payer: Self-pay | Admitting: Family

## 2015-07-25 VITALS — BP 131/81 | HR 92 | Temp 97.9°F | Ht 63.0 in | Wt 194.2 lb

## 2015-07-25 DIAGNOSIS — E876 Hypokalemia: Secondary | ICD-10-CM

## 2015-07-25 DIAGNOSIS — R002 Palpitations: Secondary | ICD-10-CM

## 2015-07-25 DIAGNOSIS — R600 Localized edema: Secondary | ICD-10-CM | POA: Diagnosis not present

## 2015-07-25 DIAGNOSIS — K2100 Gastro-esophageal reflux disease with esophagitis, without bleeding: Secondary | ICD-10-CM

## 2015-07-25 DIAGNOSIS — I10 Essential (primary) hypertension: Secondary | ICD-10-CM

## 2015-07-25 DIAGNOSIS — E559 Vitamin D deficiency, unspecified: Secondary | ICD-10-CM | POA: Diagnosis not present

## 2015-07-25 NOTE — Progress Notes (Signed)
   Subjective:    Patient ID: Stacey [name], female    DOB: [birth year], 74 y.o.   MRN: [id number]  Pt presents to the office today for chronic follow up.  Hypertension This is a chronic problem. The current episode started more than 1 year ago. The problem has been resolved since onset. The problem is controlled. Associated symptoms include peripheral edema ("just a little"). Pertinent negatives include no anxiety, headaches, palpitations or shortness of breath. Risk factors for coronary artery disease include obesity, post-menopausal state, sedentary lifestyle, family history and dyslipidemia. Past treatments include diuretics. The current treatment provides significant improvement. There is no history of kidney disease, CAD/MI, CVA, heart failure or a thyroid problem. There is no history of sleep apnea.  Gastroesophageal Reflux She reports no belching, no coughing, no heartburn, no sore throat or no wheezing. This is a chronic problem. The current episode started more than 1 year ago. The problem occurs rarely. The symptoms are aggravated by certain foods. Pertinent negatives include no fatigue. She has tried a PPI for the symptoms. The treatment provided moderate relief.      Review of Systems  Constitutional: Negative.  Negative for fatigue.  HENT: Negative.  Negative for sore throat.   Eyes: Negative.   Respiratory: Negative.  Negative for cough, shortness of breath and wheezing.   Cardiovascular: Negative.  Negative for palpitations.  Gastrointestinal: Negative.  Negative for heartburn.  Endocrine: Negative.   Genitourinary: Negative.   Musculoskeletal: Negative.   Neurological: Negative.  Negative for headaches.  Hematological: Negative.   Psychiatric/Behavioral: Negative.   All other systems reviewed and are negative.      Objective:   Physical Exam  Constitutional: She is oriented to person, place, and time. She appears well-developed and well-nourished. No distress.  HENT:   Head: Normocephalic and atraumatic.  Right Ear: External ear normal.  Mouth/Throat: Oropharynx is clear and moist.  Eyes: Pupils are equal, round, and reactive to light.  Neck: Normal range of motion. Neck supple. No thyromegaly present.  Cardiovascular: Normal rate, regular rhythm, normal heart sounds and intact distal pulses.   No murmur heard. Pulmonary/Chest: Effort normal and breath sounds normal. No respiratory distress. She has no wheezes.  Abdominal: Soft. Bowel sounds are normal. She exhibits no distension. There is no tenderness.  Musculoskeletal: Normal range of motion. She exhibits edema (trace amt in BLE). She exhibits no tenderness.  Neurological: She is alert and oriented to person, place, and time. She has normal reflexes. No cranial nerve deficit.  Skin: Skin is warm and dry.  Psychiatric: She has a normal mood and affect. Her behavior is normal. Judgment and thought content normal.  Vitals reviewed.   BP 131/81 mmHg  Pulse 92  Temp(Src) 97.9 F (36.6 C) (Oral)  Ht _0  (1.6 m)  Wt 194 lb 3.2 oz (88.089 kg)  BMI 34.41 kg/m2       Assessment & Plan:  1. Vitamin D deficiency - CMP14+EGFR - Vit D  25 hydroxy (rtn osteoporosis monitoring)  2. Heart palpitations - CMP14+EGFR  3. Gastroesophageal reflux disease with esophagitis - CMP14+EGFR  4. Essential hypertension - CMP14+EGFR  5. Bilateral lower extremity edema - CMP14+EGFR  6. Hypokalemia - CMP14+EGFR   Continue all meds Labs pending Health Maintenance reviewed-hemoccult cards given to patient with directions Diet and exercise encouraged RTO 6 months   Evelina Dun, FNP

## 2015-07-25 NOTE — Patient Instructions (Signed)

## 2015-07-25 NOTE — Addendum Note (Signed)
Addended by: Prescott GumLAND, Freddi Schrager M on: 07/25/2015 04:38 PM   Modules accepted: Kipp BroodSmartSet

## 2015-07-26 LAB — CMP14+EGFR
ALT: 25 IU/L (ref 0–32)
AST: 26 IU/L (ref 0–40)
Albumin/Globulin Ratio: 1.5 (ref 1.1–2.5)
Albumin: 4.3 g/dL (ref 3.5–4.8)
Alkaline Phosphatase: 69 IU/L (ref 39–117)
BUN/Creatinine Ratio: 21 (ref 11–26)
BUN: 22 mg/dL (ref 8–27)
Bilirubin Total: <0.2 mg/dL (ref 0.0–1.2)
CO2: 27 mmol/L (ref 18–29)
CREATININE: 1.04 mg/dL — AB (ref 0.57–1.00)
Calcium: 9.7 mg/dL (ref 8.7–10.3)
Chloride: 96 mmol/L — ABNORMAL LOW (ref 97–106)
GFR calc Af Amer: 62 mL/min/{1.73_m2} (ref >59)
GFR, EST NON AFRICAN AMERICAN: 53 mL/min/{1.73_m2} — AB (ref >59)
GLUCOSE: 106 mg/dL — AB (ref 65–99)
Globulin, Total: 2.9 g/dL (ref 1.5–4.5)
Potassium: 3.5 mmol/L (ref 3.5–5.2)
Sodium: 141 mmol/L (ref 136–144)
TOTAL PROTEIN: 7.2 g/dL (ref 6.0–8.5)

## 2015-07-26 LAB — VITAMIN D 25 HYDROXY (VIT D DEFICIENCY, FRACTURES): Vit D, 25-Hydroxy: 38.1 ng/mL (ref 30.0–100.0)

## 2015-10-11 ENCOUNTER — Other Ambulatory Visit: Payer: Self-pay | Admitting: Family

## 2015-11-17 ENCOUNTER — Telehealth: Payer: Self-pay | Admitting: Family

## 2015-12-28 ENCOUNTER — Ambulatory Visit (INDEPENDENT_AMBULATORY_CARE_PROVIDER_SITE_OTHER): Payer: Medicare Other | Admitting: Family

## 2015-12-28 ENCOUNTER — Encounter: Payer: Self-pay | Admitting: Family

## 2015-12-28 VITALS — BP 139/74 | HR 97 | Temp 97.1°F | Ht 63.0 in | Wt 195.0 lb

## 2015-12-28 DIAGNOSIS — E785 Hyperlipidemia, unspecified: Secondary | ICD-10-CM

## 2015-12-28 DIAGNOSIS — E559 Vitamin D deficiency, unspecified: Secondary | ICD-10-CM | POA: Diagnosis not present

## 2015-12-28 DIAGNOSIS — E8881 Metabolic syndrome: Secondary | ICD-10-CM | POA: Diagnosis not present

## 2015-12-28 DIAGNOSIS — K2100 Gastro-esophageal reflux disease with esophagitis, without bleeding: Secondary | ICD-10-CM

## 2015-12-28 DIAGNOSIS — E876 Hypokalemia: Secondary | ICD-10-CM | POA: Diagnosis not present

## 2015-12-28 DIAGNOSIS — R600 Localized edema: Secondary | ICD-10-CM

## 2015-12-28 DIAGNOSIS — I10 Essential (primary) hypertension: Secondary | ICD-10-CM | POA: Diagnosis not present

## 2015-12-28 MED ORDER — HYDROCHLOROTHIAZIDE 25 MG PO TABS: 25.0000 mg | ORAL_TABLET | Freq: Every day | ORAL | Status: DC

## 2015-12-28 NOTE — Progress Notes (Signed)
Subjective:    Patient ID: Stacey [name], female    DOB: 07-22-1941, 75 y.o.   MRN: [id number]  Pt presents to the office today for chronic follow up.  Hypertension This is a chronic problem. The current episode started more than 1 year ago. The problem has been resolved since onset. The problem is controlled. Associated symptoms include peripheral edema ("just a little"). Pertinent negatives include no anxiety, headaches, palpitations or shortness of breath. Risk factors for coronary artery disease include obesity, post-menopausal state, sedentary lifestyle, family history and dyslipidemia. Past treatments include diuretics. The current treatment provides significant improvement. There is no history of kidney disease, CAD/MI, CVA, heart failure or a thyroid problem. There is no history of sleep apnea.  Gastroesophageal Reflux She reports no belching, no coughing, no heartburn, no sore throat or no wheezing. This is a chronic problem. The current episode started more than 1 year ago. The problem occurs rarely. The symptoms are aggravated by certain foods. Pertinent negatives include no fatigue. She has tried a PPI for the symptoms. The treatment provided moderate relief.  Peripheral Edema PT currently taking lasix as needed for edema in lower legs. Pt reports trace amount of swelling in bilateral legs that become worse at the end of the day. Pt also takes HCTZ daily that patient states greatly helps.     Review of Systems  Constitutional: Negative.  Negative for fatigue.  HENT: Negative.  Negative for sore throat.   Eyes: Negative.   Respiratory: Negative.  Negative for cough, shortness of breath and wheezing.   Cardiovascular: Negative.  Negative for palpitations.  Gastrointestinal: Negative.  Negative for heartburn.  Endocrine: Negative.   Genitourinary: Negative.   Musculoskeletal: Negative.   Neurological: Negative.  Negative for headaches.  Hematological: Negative.     Psychiatric/Behavioral: Negative.   All other systems reviewed and are negative.      Objective:   Physical Exam  Constitutional: She is oriented to person, place, and time. She appears well-developed and well-nourished. No distress.  HENT:  Head: Normocephalic and atraumatic.  Right Ear: External ear normal.  Mouth/Throat: Oropharynx is clear and moist.  Eyes: Pupils are equal, round, and reactive to light.  Neck: Normal range of motion. Neck supple. No thyromegaly present.  Cardiovascular: Normal rate, regular rhythm, normal heart sounds and intact distal pulses.   No murmur heard. Pulmonary/Chest: Effort normal and breath sounds normal. No respiratory distress. She has no wheezes.  Abdominal: Soft. Bowel sounds are normal. She exhibits no distension. There is no tenderness.  Musculoskeletal: Normal range of motion. She exhibits edema (trace amt in BLE). She exhibits no tenderness.  Neurological: She is alert and oriented to person, place, and time. She has normal reflexes. No cranial nerve deficit.  Skin: Skin is warm and dry.  Psychiatric: She has a normal mood and affect. Her behavior is normal. Judgment and thought content normal.  Vitals reviewed.   Temp(Src) 97.1 F (36.2 C) (Oral)  Ht '5\' 3"'  (1.6 m)  Wt 195 lb (88.451 kg)  BMI 34.55 kg/m2       Assessment & Plan:  1. Vitamin D deficiency - CMP14+EGFR - VITAMIN D 25 Hydroxy (Vit-D Deficiency, Fractures)  2. Bilateral lower extremity edema - CMP14+EGFR  3. Hypokalemia - CMP14+EGFR  4. Gastroesophageal reflux disease with esophagitis - CMP14+EGFR  5. Essential hypertension - hydrochlorothiazide (HYDRODIURIL) 25 MG tablet; Take 1 tablet (25 mg total) by mouth daily.  Dispense: 90 tablet; Refill: 3  6. Hyperlipidemia - Lipid  panel  7. Metabolic syndrome   Continue all meds Labs pending Health Maintenance reviewed Diet and exercise encouraged RTO 6 months  Evelina Dun, FNP

## 2015-12-28 NOTE — Patient Instructions (Signed)
Health Maintenance, Female Adopting a healthy lifestyle and getting preventive care can go a long way to promote health and wellness. Talk with your health care provider about what schedule of regular examinations is right for you. This is a good chance for you to check in with your provider about disease prevention and staying healthy. In between checkups, there are plenty of things you can do on your own. Experts have done a lot of research about which lifestyle changes and preventive measures are most likely to keep you healthy. Ask your health care provider for more information. WEIGHT AND DIET  Eat a healthy diet  Be sure to include plenty of vegetables, fruits, low-fat dairy products, and lean protein.  Do not eat a lot of foods high in solid fats, added sugars, or salt.  Get regular exercise. This is one of the most important things you can do for your health.  Most adults should exercise for at least 150 minutes each week. The exercise should increase your heart rate and make you sweat (moderate-intensity exercise).  Most adults should also do strengthening exercises at least twice a week. This is in addition to the moderate-intensity exercise.  Maintain a healthy weight  Body mass index (BMI) is a measurement that can be used to identify possible weight problems. It estimates body fat based on height and weight. Your health care provider can help determine your BMI and help you achieve or maintain a healthy weight.  For females 20 years of age and older:   A BMI below 18.5 is considered underweight.  A BMI of 18.5 to 24.9 is normal.  A BMI of 25 to 29.9 is considered overweight.  A BMI of 30 and above is considered obese.  Watch levels of cholesterol and blood lipids  You should start having your blood tested for lipids and cholesterol at 74 years of age, then have this test every 5 years.  You may need to have your cholesterol levels checked more often if:  Your lipid  or cholesterol levels are high.  You are older than 74 years of age.  You are at high risk for heart disease.  CANCER SCREENING   Lung Cancer  Lung cancer screening is recommended for adults 55-80 years old who are at high risk for lung cancer because of a history of smoking.  A yearly low-dose CT scan of the lungs is recommended for people who:  Currently smoke.  Have quit within the past 15 years.  Have at least a 30-pack-year history of smoking. A pack year is smoking an average of one pack of cigarettes a day for 1 year.  Yearly screening should continue until it has been 15 years since you quit.  Yearly screening should stop if you develop a health problem that would prevent you from having lung cancer treatment.  Breast Cancer  Practice breast self-awareness. This means understanding how your breasts normally appear and feel.  It also means doing regular breast self-exams. Let your health care provider know about any changes, no matter how small.  If you are in your 20s or 30s, you should have a clinical breast exam (CBE) by a health care provider every 1-3 years as part of a regular health exam.  If you are 40 or older, have a CBE every year. Also consider having a breast X-ray (mammogram) every year.  If you have a family history of breast cancer, talk to your health care provider about genetic screening.  If you   are at high risk for breast cancer, talk to your health care provider about having an MRI and a mammogram every year.  Breast cancer gene (BRCA) assessment is recommended for women who have family members with BRCA-related cancers. BRCA-related cancers include:  Breast.  Ovarian.  Tubal.  Peritoneal cancers.  Results of the assessment will determine the need for genetic counseling and BRCA1 and BRCA2 testing. Cervical Cancer Your health care provider may recommend that you be screened regularly for cancer of the pelvic organs (ovaries, uterus, and  vagina). This screening involves a pelvic examination, including checking for microscopic changes to the surface of your cervix (Pap test). You may be encouraged to have this screening done every 3 years, beginning at age 21.  For women ages 30-65, health care providers may recommend pelvic exams and Pap testing every 3 years, or they may recommend the Pap and pelvic exam, combined with testing for human papilloma virus (HPV), every 5 years. Some types of HPV increase your risk of cervical cancer. Testing for HPV may also be done on women of any age with unclear Pap test results.  Other health care providers may not recommend any screening for nonpregnant women who are considered low risk for pelvic cancer and who do not have symptoms. Ask your health care provider if a screening pelvic exam is right for you.  If you have had past treatment for cervical cancer or a condition that could lead to cancer, you need Pap tests and screening for cancer for at least 20 years after your treatment. If Pap tests have been discontinued, your risk factors (such as having a new sexual partner) need to be reassessed to determine if screening should resume. Some women have medical problems that increase the chance of getting cervical cancer. In these cases, your health care provider may recommend more frequent screening and Pap tests. Colorectal Cancer  This type of cancer can be detected and often prevented.  Routine colorectal cancer screening usually begins at 74 years of age and continues through 75 years of age.  Your health care provider may recommend screening at an earlier age if you have risk factors for colon cancer.  Your health care provider may also recommend using home test kits to check for hidden blood in the stool.  A small camera at the end of a tube can be used to examine your colon directly (sigmoidoscopy or colonoscopy). This is done to check for the earliest forms of colorectal  cancer.  Routine screening usually begins at age 50.  Direct examination of the colon should be repeated every 5-10 years through 75 years of age. However, you may need to be screened more often if early forms of precancerous polyps or small growths are found. Skin Cancer  Check your skin from head to toe regularly.  Tell your health care provider about any new moles or changes in moles, especially if there is a change in a mole's shape or color.  Also tell your health care provider if you have a mole that is larger than the size of a pencil eraser.  Always use sunscreen. Apply sunscreen liberally and repeatedly throughout the day.  Protect yourself by wearing long sleeves, pants, a wide-brimmed hat, and sunglasses whenever you are outside. HEART DISEASE, DIABETES, AND HIGH BLOOD PRESSURE   High blood pressure causes heart disease and increases the risk of stroke. High blood pressure is more likely to develop in:  People who have blood pressure in the high end   of the normal range (130-139/85-89 mm Hg).  People who are overweight or obese.  People who are African American.  If you are 38-23 years of age, have your blood pressure checked every 3-5 years. If you are 61 years of age or older, have your blood pressure checked every year. You should have your blood pressure measured twice--once when you are at a hospital or clinic, and once when you are not at a hospital or clinic. Record the average of the two measurements. To check your blood pressure when you are not at a hospital or clinic, you can use:  An automated blood pressure machine at a pharmacy.  A home blood pressure monitor.  If you are between 45 years and 39 years old, ask your health care provider if you should take aspirin to prevent strokes.  Have regular diabetes screenings. This involves taking a blood sample to check your fasting blood sugar level.  If you are at a normal weight and have a low risk for diabetes,  have this test once every three years after 74 years of age.  If you are overweight and have a high risk for diabetes, consider being tested at a younger age or more often. PREVENTING INFECTION  Hepatitis B  If you have a higher risk for hepatitis B, you should be screened for this virus. You are considered at high risk for hepatitis B if:  You were born in a country where hepatitis B is common. Ask your health care provider which countries are considered high risk.  Your parents were born in a high-risk country, and you have not been immunized against hepatitis B (hepatitis B vaccine).  You have HIV or AIDS.  You use needles to inject street drugs.  You live with someone who has hepatitis B.  You have had sex with someone who has hepatitis B.  You get hemodialysis treatment.  You take certain medicines for conditions, including cancer, organ transplantation, and autoimmune conditions. Hepatitis C  Blood testing is recommended for:  Everyone born from [birth year] through 1964.  Anyone with known risk factors for hepatitis C. Sexually transmitted infections (STIs)  You should be screened for sexually transmitted infections (STIs) including gonorrhea and chlamydia if:  You are sexually active and are younger than 74 years of age.  You are older than 74 years of age and your health care provider tells you that you are at risk for this type of infection.  Your sexual activity has changed since you were last screened and you are at an increased risk for chlamydia or gonorrhea. Ask your health care provider if you are at risk.  If you do not have HIV, but are at risk, it may be recommended that you take a prescription medicine daily to prevent HIV infection. This is called pre-exposure prophylaxis (PrEP). You are considered at risk if:  You are sexually active and do not regularly use condoms or know the HIV status of your partner(s).  You take drugs by injection.  You are sexually  active with a partner who has HIV. Talk with your health care provider about whether you are at high risk of being infected with HIV. If you choose to begin PrEP, you should first be tested for HIV. You should then be tested every 3 months for as long as you are taking PrEP.  PREGNANCY   If you are premenopausal and you may become pregnant, ask your health care provider about preconception counseling.  If you may  become pregnant, take 400 to 800 micrograms (mcg) of folic acid every day.  If you want to prevent pregnancy, talk to your health care provider about birth control (contraception). OSTEOPOROSIS AND MENOPAUSE   Osteoporosis is a disease in which the bones lose minerals and strength with aging. This can result in serious bone fractures. Your risk for osteoporosis can be identified using a bone density scan.  If you are 51 years of age or older, or if you are at risk for osteoporosis and fractures, ask your health care provider if you should be screened.  Ask your health care provider whether you should take a calcium or vitamin D supplement to lower your risk for osteoporosis.  Menopause may have certain physical symptoms and risks.  Hormone replacement therapy may reduce some of these symptoms and risks. Talk to your health care provider about whether hormone replacement therapy is right for you.  HOME CARE INSTRUCTIONS   Schedule regular health, dental, and eye exams.  Stay current with your immunizations.   Do not use any tobacco products including cigarettes, chewing tobacco, or electronic cigarettes.  If you are pregnant, do not drink alcohol.  If you are breastfeeding, limit how much and how often you drink alcohol.  Limit alcohol intake to no more than 1 drink per day for nonpregnant women. One drink equals 12 ounces of beer, 5 ounces of wine, or 1 ounces of hard liquor.  Do not use street drugs.  Do not share needles.  Ask your health care provider for help if  you need support or information about quitting drugs.  Tell your health care provider if you often feel depressed.  Tell your health care provider if you have ever been abused or do not feel safe at home.   This information is not intended to replace advice given to you by your health care provider. Make sure you discuss any questions you have with your health care provider.   Document Released: 04/08/2011 Document Revised: 10/14/2014 Document Reviewed: 08/25/2013 Elsevier Interactive Patient Education [employer].

## 2015-12-29 ENCOUNTER — Other Ambulatory Visit: Payer: Self-pay | Admitting: Family

## 2015-12-29 LAB — CMP14+EGFR
A/G RATIO: 1.4 (ref 1.2–2.2)
ALT: 21 IU/L (ref 0–32)
AST: 20 IU/L (ref 0–40)
Albumin: 4.3 g/dL (ref 3.5–4.8)
Alkaline Phosphatase: 68 IU/L (ref 39–117)
BUN / CREAT RATIO: 23 (ref 11–26)
BUN: 16 mg/dL (ref 8–27)
CHLORIDE: 96 mmol/L (ref 96–106)
CO2: 27 mmol/L (ref 18–29)
Calcium: 9.6 mg/dL (ref 8.7–10.3)
Creatinine, Ser: 0.69 mg/dL (ref 0.57–1.00)
GFR calc Af Amer: 100 mL/min/{1.73_m2} (ref >59)
GFR calc non Af Amer: 87 mL/min/{1.73_m2} (ref >59)
GLUCOSE: 105 mg/dL — AB (ref 65–99)
Globulin, Total: 3.1 g/dL (ref 1.5–4.5)
Potassium: 3.4 mmol/L — ABNORMAL LOW (ref 3.5–5.2)
Sodium: 142 mmol/L (ref 134–144)
TOTAL PROTEIN: 7.4 g/dL (ref 6.0–8.5)

## 2015-12-29 LAB — LIPID PANEL
CHOL/HDL RATIO: 4.5 ratio — AB (ref 0.0–4.4)
Cholesterol, Total: 236 mg/dL — ABNORMAL HIGH (ref 100–199)
HDL: 53 mg/dL (ref >39)
LDL Calculated: 148 mg/dL — ABNORMAL HIGH (ref 0–99)
Triglycerides: 173 mg/dL — ABNORMAL HIGH (ref 0–149)
VLDL Cholesterol Cal: 35 mg/dL (ref 5–40)

## 2015-12-29 LAB — VITAMIN D 25 HYDROXY (VIT D DEFICIENCY, FRACTURES): Vit D, 25-Hydroxy: 37.9 ng/mL (ref 30.0–100.0)

## 2015-12-29 MED ORDER — ATORVASTATIN CALCIUM 20 MG PO TABS: 20.0000 mg | ORAL_TABLET | Freq: Every day | ORAL | Status: DC

## 2016-01-22 ENCOUNTER — Ambulatory Visit (INDEPENDENT_AMBULATORY_CARE_PROVIDER_SITE_OTHER): Payer: Medicare Other | Admitting: Family

## 2016-01-22 ENCOUNTER — Encounter: Payer: Self-pay | Admitting: Family

## 2016-01-22 ENCOUNTER — Encounter (INDEPENDENT_AMBULATORY_CARE_PROVIDER_SITE_OTHER): Payer: Self-pay

## 2016-01-22 VITALS — BP 109/65 | HR 88 | Temp 97.4°F | Ht 63.0 in | Wt 194.0 lb

## 2016-01-22 DIAGNOSIS — J309 Allergic rhinitis, unspecified: Secondary | ICD-10-CM | POA: Diagnosis not present

## 2016-01-22 DIAGNOSIS — J209 Acute bronchitis, unspecified: Secondary | ICD-10-CM | POA: Diagnosis not present

## 2016-01-22 MED ORDER — BENZONATATE 200 MG PO CAPS: 200.0000 mg | ORAL_CAPSULE | Freq: Three times a day (TID) | ORAL | Status: DC | PRN

## 2016-01-22 MED ORDER — HYDROCODONE-HOMATROPINE 5-1.5 MG/5ML PO SYRP: 5.0000 mL | ORAL_SOLUTION | Freq: Three times a day (TID) | ORAL | Status: DC | PRN

## 2016-01-22 MED ORDER — PREDNISONE 10 MG (21) PO TBPK: 10.0000 mg | ORAL_TABLET | Freq: Every day | ORAL | Status: DC

## 2016-01-22 MED ORDER — FLUTICASONE PROPIONATE 50 MCG/ACT NA SUSP: 2.0000 | Freq: Every day | NASAL | Status: DC

## 2016-01-22 NOTE — Progress Notes (Signed)
Subjective:    Patient ID: Stacey [name], female    DOB: 03/04/1941, 75 y.o.   MRN: [id number]  Cough This is a new problem. The current episode started in the past 7 days. The problem has been gradually worsening. The problem occurs every few minutes. The cough is non-productive. Associated symptoms include chills, a fever, headaches, myalgias, nasal congestion and postnasal drip. Pertinent negatives include no ear congestion, ear pain, rhinorrhea, sore throat, shortness of breath or wheezing. The symptoms are aggravated by pollens. Treatments tried: Mucinex. The treatment provided mild relief. There is no history of asthma or COPD.      Review of Systems  Constitutional: Positive for fever and chills.  HENT: Positive for postnasal drip. Negative for ear pain, rhinorrhea and sore throat.   Eyes: Negative.   Respiratory: Positive for cough. Negative for shortness of breath and wheezing.   Cardiovascular: Negative.  Negative for palpitations.  Gastrointestinal: Negative.   Endocrine: Negative.   Genitourinary: Negative.   Musculoskeletal: Positive for myalgias.  Neurological: Positive for headaches.  Hematological: Negative.   Psychiatric/Behavioral: Negative.   All other systems reviewed and are negative.      Objective:   Physical Exam  Constitutional: She is oriented to person, place, and time. She appears well-developed and well-nourished. No distress.  HENT:  Head: Normocephalic and atraumatic.  Right Ear: External ear normal.  Left Ear: External ear normal.  Nasal passage erythemas with mild swelling  Oropharynx erythemas   Eyes: Pupils are equal, round, and reactive to light.  Neck: Normal range of motion. Neck supple. No thyromegaly present.  Cardiovascular: Normal rate, regular rhythm, normal heart sounds and intact distal pulses.   No murmur heard. Pulmonary/Chest: Effort normal and breath sounds normal. No respiratory distress. She has no wheezes.  Intermittent  nonproductive cough   Abdominal: Soft. Bowel sounds are normal. She exhibits no distension. There is no tenderness.  Musculoskeletal: Normal range of motion. She exhibits no edema or tenderness.  Neurological: She is alert and oriented to person, place, and time.  Skin: Skin is warm and dry.  Psychiatric: She has a normal mood and affect. Her behavior is normal. Judgment and thought content normal.  Vitals reviewed.     BP 109/65 mmHg  Pulse 88  Temp(Src) 97.4 F (36.3 C) (Oral)  Ht  (1.6 m)  Wt 194 lb (87.998 kg)  BMI 34.37 kg/m2     Assessment & Plan:  1. Acute bronchitis, unspecified organism -- Take meds as prescribed - Use a cool mist humidifier  -Use saline nose sprays frequently -Saline irrigations of the nose can be very helpful if done frequently.  * 4X daily for 1 week*  * Use of a nettie pot can be helpful with this. Follow directions with this* -Force fluids -For any cough or congestion  Use plain Mucinex- regular strength or max strength is fine   * Children- consult with Pharmacist for dosing -For fever or aces or pains- take tylenol or ibuprofen appropriate for age and weight.  * for fevers greater than 101 orally you may alternate ibuprofen and tylenol every  3 hours. -Throat lozenges if help - predniSONE (STERAPRED UNI-PAK 21 TAB) 10 MG (21) TBPK tablet; Take 1 tablet (10 mg total) by mouth daily. As directed x 6 days  Dispense: 21 tablet; Refill: 0 - benzonatate (TESSALON) 200 MG capsule; Take 1 capsule (200 mg total) by mouth 3 (three) times daily as needed.  Dispense: 30 capsule; Refill: 1 -  HYDROcodone-homatropine (HYCODAN) 5-1.5 MG/5ML syrup; Take 5 mLs by mouth every 8 (eight) hours as needed for cough.  Dispense: 120 mL; Refill: 0  2. Allergic rhinitis, unspecified allergic rhinitis type - predniSONE (STERAPRED UNI-PAK 21 TAB) 10 MG (21) TBPK tablet; Take 1 tablet (10 mg total) by mouth daily. As directed x 6 days  Dispense: 21 tablet; Refill:  0 - fluticasone (FLONASE) 50 MCG/ACT nasal spray; Place 2 sprays into both nostrils daily.  Dispense: 16 g; Refill: 6  Jannifer Rodneyhristy Keymari Sato, FNP

## 2016-01-22 NOTE — Patient Instructions (Addendum)
Acute Bronchitis Bronchitis is inflammation of the airways that extend from the windpipe into the lungs (bronchi). The inflammation often causes mucus to develop. This leads to a cough, which is the most common symptom of bronchitis.  In acute bronchitis, the condition usually develops suddenly and goes away over time, usually in a couple weeks. Smoking, allergies, and asthma can make bronchitis worse. Repeated episodes of bronchitis may cause further lung problems.  CAUSES Acute bronchitis is most often caused by the same virus that causes a cold. The virus can spread from person to person (contagious) through coughing, sneezing, and touching contaminated objects. SIGNS AND SYMPTOMS   Cough.   Fever.   Coughing up mucus.   Body aches.   Chest congestion.   Chills.   Shortness of breath.   Sore throat.  DIAGNOSIS  Acute bronchitis is usually diagnosed through a physical exam. Your health care provider will also ask you questions about your medical history. Tests, such as chest X-rays, are sometimes done to rule out other conditions.  TREATMENT  Acute bronchitis usually goes away in a couple weeks. Oftentimes, no medical treatment is necessary. Medicines are sometimes given for relief of fever or cough. Antibiotic medicines are usually not needed but may be prescribed in certain situations. In some cases, an inhaler may be recommended to help reduce shortness of breath and control the cough. A cool mist vaporizer may also be used to help thin bronchial secretions and make it easier to clear the chest.  HOME CARE INSTRUCTIONS  Get plenty of rest.   Drink enough fluids to keep your urine clear or pale yellow (unless you have a medical condition that requires fluid restriction). Increasing fluids may help thin your respiratory secretions (sputum) and reduce chest congestion, and it will prevent dehydration.   Take medicines only as directed by your health care provider.  If  you were prescribed an antibiotic medicine, finish it all even if you start to feel better.  Avoid smoking and secondhand smoke. Exposure to cigarette smoke or irritating chemicals will make bronchitis worse. If you are a smoker, consider using nicotine gum or skin patches to help control withdrawal symptoms. Quitting smoking will help your lungs heal faster.   Reduce the chances of another bout of acute bronchitis by washing your hands frequently, avoiding people with cold symptoms, and trying not to touch your hands to your mouth, nose, or eyes.   Keep all follow-up visits as directed by your health care provider.  SEEK MEDICAL CARE IF: Your symptoms do not improve after 1 week of treatment.  SEEK IMMEDIATE MEDICAL CARE IF:  You develop an increased fever or chills.   You have chest pain.   You have severe shortness of breath.  You have bloody sputum.   You develop dehydration.  You faint or repeatedly feel like you are going to pass out.  You develop repeated vomiting.  You develop a severe headache. MAKE SURE YOU:   Understand these instructions.  Will watch your condition.  Will get help right away if you are not doing well or get worse.   This information is not intended to replace advice given to you by your health care provider. Make sure you discuss any questions you have with your health care provider.   Document Released: 10/31/2004 Document Revised: 10/14/2014 Document Reviewed: 03/16/2013 Elsevier Interactive Patient Education 2016 [employer]  - Take meds as prescribed - Use a cool mist humidifier  -Use saline nose sprays frequently -Saline   irrigations of the nose can be very helpful if done frequently.  * 4X daily for 1 week*  * Use of a nettie pot can be helpful with this. Follow directions with this* -Force fluids -For any cough or congestion  Use plain Mucinex- regular strength or max strength is fine   * Children- consult with Pharmacist for  dosing -For fever or aces or pains- take tylenol or ibuprofen appropriate for age and weight.  * for fevers greater than 101 orally you may alternate ibuprofen and tylenol every  3 hours. -Throat lozenges if help    Gerrit Rafalski, FNP  

## 2016-02-21 ENCOUNTER — Other Ambulatory Visit: Payer: Self-pay | Admitting: Family

## 2016-04-26 ENCOUNTER — Telehealth: Payer: Self-pay | Admitting: Family

## 2016-04-29 ENCOUNTER — Other Ambulatory Visit: Payer: Self-pay | Admitting: Family Medicine

## 2016-05-09 ENCOUNTER — Ambulatory Visit (INDEPENDENT_AMBULATORY_CARE_PROVIDER_SITE_OTHER): Payer: Medicare Other | Admitting: Family

## 2016-05-09 ENCOUNTER — Encounter: Payer: Self-pay | Admitting: Family

## 2016-05-09 VITALS — BP 125/76 | HR 88 | Temp 97.1°F | Ht 63.0 in | Wt 197.2 lb

## 2016-05-09 DIAGNOSIS — E669 Obesity, unspecified: Secondary | ICD-10-CM | POA: Diagnosis not present

## 2016-05-09 DIAGNOSIS — J0110 Acute frontal sinusitis, unspecified: Secondary | ICD-10-CM | POA: Diagnosis not present

## 2016-05-09 DIAGNOSIS — E663 Overweight: Secondary | ICD-10-CM | POA: Insufficient documentation

## 2016-05-09 MED ORDER — AMOXICILLIN-POT CLAVULANATE 875-125 MG PO TABS: 1.0000 | ORAL_TABLET | Freq: Two times a day (BID) | ORAL | 0 refills | Status: DC

## 2016-05-09 MED ORDER — DOXYCYCLINE HYCLATE 100 MG PO TABS: 100.0000 mg | ORAL_TABLET | Freq: Two times a day (BID) | ORAL | 0 refills | Status: DC

## 2016-05-09 MED ORDER — LORATADINE 10 MG PO TABS: 10.0000 mg | ORAL_TABLET | Freq: Every day | ORAL | 11 refills | Status: DC

## 2016-05-09 NOTE — Patient Instructions (Signed)

## 2016-05-09 NOTE — Progress Notes (Signed)
   Subjective:    Patient ID: Stacey [name], female    DOB: January 01, 1941, 75 y.o.   MRN: [id number]  Sinus Problem  This is a new problem. The current episode started more than 1 month ago. The problem has been gradually worsening since onset. There has been no fever. Her pain is at a severity of 7/10. The pain is moderate. Associated symptoms include congestion, headaches, a hoarse voice, sinus pressure and a sore throat. Pertinent negatives include no coughing, ear pain or sneezing. Past treatments include oral decongestants, saline sprays, lying down, spray decongestants and sitting up. The treatment provided mild relief.      Review of Systems  HENT: Positive for congestion, hoarse voice, sinus pressure and sore throat. Negative for ear pain and sneezing.   Respiratory: Negative for cough.   Neurological: Positive for headaches.  All other systems reviewed and are negative.      Objective:   Physical Exam  Constitutional: She is oriented to person, place, and time. She appears well-developed and well-nourished. No distress.  HENT:  Head: Normocephalic and atraumatic.  Right Ear: External ear normal.  Left Ear: External ear normal.  Nose: Mucosal edema and rhinorrhea present. Right sinus exhibits frontal sinus tenderness. Left sinus exhibits frontal sinus tenderness.  Mouth/Throat: Posterior oropharyngeal edema and posterior oropharyngeal erythema present.  Eyes: Pupils are equal, round, and reactive to light.  Neck: Normal range of motion. Neck supple. No thyromegaly present.  Cardiovascular: Normal rate, regular rhythm, normal heart sounds and intact distal pulses.   No murmur heard. Pulmonary/Chest: Effort normal and breath sounds normal. No respiratory distress. She has no wheezes.  Abdominal: Soft. Bowel sounds are normal. She exhibits no distension. There is no tenderness.  Musculoskeletal: Normal range of motion. She exhibits no edema or tenderness.  Neurological: She is  alert and oriented to person, place, and time. She has normal reflexes. No cranial nerve deficit.  Skin: Skin is warm and dry.  Psychiatric: She has a normal mood and affect. Her behavior is normal. Judgment and thought content normal.  Vitals reviewed.     BP 125/76   Pulse 88   Temp 97.1 F (36.2 C) (Oral)   Ht 5\' 3"  (1.6 m)   Wt 197 lb 3.2 oz (89.4 kg)   BMI 34.93 kg/m      Assessment & Plan:  1. Obesity (BMI 30-39.9)  2. Acute frontal sinusitis, recurrence not specified -- Take meds as prescribed - Use a cool mist humidifier  -Use saline nose sprays frequently -Saline irrigations of the nose can be very helpful if done frequently.  * 4X daily for 1 week*  * Use of a nettie pot can be helpful with this. Follow directions with this* -Force fluids -For any cough or congestion  Use plain Mucinex- regular strength or max strength is fine   * Children- consult with Pharmacist for dosing -For fever or aces or pains- take tylenol or ibuprofen appropriate for age and weight.  * for fevers greater than 101 orally you may alternate ibuprofen and tylenol every  3 hours. -Throat lozenges if help - amoxicillin-clavulanate (AUGMENTIN) 875-125 MG tablet; Take 1 tablet by mouth 2 (two) times daily.  Dispense: 14 tablet; Refill: 0 - loratadine (CLARITIN) 10 MG tablet; Take 1 tablet (10 mg total) by mouth daily.  Dispense: 30 tablet; Refill: 11  Jannifer Rodney, FNP

## 2016-05-22 ENCOUNTER — Ambulatory Visit (INDEPENDENT_AMBULATORY_CARE_PROVIDER_SITE_OTHER): Payer: Medicare Other | Admitting: Family Medicine

## 2016-05-22 ENCOUNTER — Encounter: Payer: Self-pay | Admitting: Family Medicine

## 2016-05-22 VITALS — BP 133/82 | HR 98 | Temp 97.6°F | Ht 63.0 in | Wt 198.5 lb

## 2016-05-22 DIAGNOSIS — J0110 Acute frontal sinusitis, unspecified: Secondary | ICD-10-CM | POA: Diagnosis not present

## 2016-05-22 MED ORDER — BETAMETHASONE SOD PHOS & ACET 6 (3-3) MG/ML IJ SUSP
6.0000 mg | Freq: Once | INTRAMUSCULAR | Status: AC
  Administered 2016-05-22: 6 mg via INTRAMUSCULAR

## 2016-05-22 MED ORDER — CEFUROXIME AXETIL 500 MG PO TABS: 500.0000 mg | ORAL_TABLET | Freq: Two times a day (BID) | ORAL | 0 refills | Status: AC

## 2016-05-22 NOTE — Progress Notes (Signed)
Subjective:  Patient ID: Stacey [name], female    DOB: 04/21/1941  Age: 75 y.o. MRN: [id number]  CC: Sinusitis (sinus pressure, ear pain, drainage x weeks)   HPI Stacey [name] presents for Symptoms include congestion, facial pain, nasal congestion, no  fever, non productive cough, post nasal drip and sinus pressure with no fever, chills, night sweats or weight loss. Onset of symptoms was a few days ago, gradually worsening since that time. Pt.is drinking moderate amounts of fluids.      History Stacey [name] has a past medical history of Arthritis; Bowel obstruction (HCC); Chronic lower back pain; GERD (gastroesophageal reflux disease); Headache(784.0); Hyperlipidemia; and Hypertension.   She has a past surgical history that includes Bilateral oophorectomy (Bilateral, 2000); Abdominal hysterectomy (1980's); Appendectomy (1980's); Inguinal hernia repair (10/09/2003; 2006); and Colon surgery (10/09/2003).   Her family history includes Heart disease in her mother; Hypertension in her brother, brother, brother, sister, sister, and sister; Stroke in her father.She reports that she has quit smoking. Her smoking use included Cigarettes. She has a 6.25 pack-year smoking history. She has never used smokeless tobacco. She reports that she does not drink alcohol or use drugs.    ROS Review of Systems  Constitutional: Negative for activity change, appetite change, chills and fever.  HENT: Positive for congestion, postnasal drip, rhinorrhea and sinus pressure. Negative for ear discharge, ear pain, hearing loss, nosebleeds, sneezing and trouble swallowing.   Respiratory: Positive for cough (slight). Negative for chest tightness and shortness of breath.   Gastrointestinal: Negative for nausea and vomiting.  Skin: Negative for rash.    Objective:  BP 133/82 (BP Location: Left Arm, Patient Position: Sitting, Cuff Size: Normal)   Pulse 98   Temp 97.6 F (36.4 C) (Oral)   Ht 5\' 3"  (1.6 m)   Wt 198 lb 8  oz (90 kg)   SpO2 96%   BMI 35.16 kg/m   BP Readings from Last 3 Encounters:  05/22/16 133/82  05/09/16 125/76  01/22/16 109/65    Wt Readings from Last 3 Encounters:  05/22/16 198 lb 8 oz (90 kg)  05/09/16 197 lb 3.2 oz (89.4 kg)  01/22/16 194 lb (88 kg)     Physical Exam  Constitutional: She appears well-developed and well-nourished.  HENT:  Head: Normocephalic and atraumatic.  Right Ear: Tympanic membrane and external ear normal. No decreased hearing is noted.  Left Ear: Tympanic membrane and external ear normal. No decreased hearing is noted.  Nose: Mucosal edema (with erythema), sinus tenderness and nasal deformity present. Right sinus exhibits frontal sinus tenderness. Left sinus exhibits frontal sinus tenderness.  Mouth/Throat: No oropharyngeal exudate or posterior oropharyngeal erythema.  Neck: No Brudzinski's sign noted.  Pulmonary/Chest: Breath sounds normal. No respiratory distress.  Lymphadenopathy:       Head (right side): No preauricular adenopathy present.       Head (left side): No preauricular adenopathy present.       Right cervical: No superficial cervical adenopathy present.      Left cervical: No superficial cervical adenopathy present.     Lab Results  Component Value Date   WBC 7.6 01/03/2015   HGB 11.9 01/03/2015   HCT 36.4 01/03/2015   PLT 239 01/03/2015   GLUCOSE 105 (H) 12/28/2015   CHOL 236 (H) 12/28/2015   TRIG 173 (H) 12/28/2015   HDL 53 12/28/2015   LDLDIRECT 134 (H) 04/13/2013   LDLCALC 148 (H) 12/28/2015   ALT 21 12/28/2015   AST 20 12/28/2015  NA 142 12/28/2015   K 3.4 (L) 12/28/2015   CL 96 12/28/2015   CREATININE 0.69 12/28/2015   BUN 16 12/28/2015   CO2 27 12/28/2015   TSH 0.910 04/13/2013   HGBA1C 6.0% 04/13/2013    Dg Chest 2 View  Result Date: 06/10/2013 *RADIOLOGY REPORT* Clinical Data: Anxiety and fatigue CHEST - 2 VIEW Comparison: May 27, 2013 Findings: There is a 1 x 1 cm nodular opacity in the inferior  anterior mediastinum seen only on the lateral view.  Elsewhere, the lungs are clear with the exception of minimal bibasilar scarring. Heart size and pulmonary vascularity are normal.  No adenopathy. There is mild degenerative change in the thoracic spine. IMPRESSION: 1 x 1 cm nodular opacity in the inferior anterior mediastinum seen only on the lateral view.  This finding warrants a noncontrast enhanced chest CT to further evaluate. There is no edema or consolidation. Original Report Authenticated By: Bretta BangWilliam Woodruff, M.D.    Assessment & Plan:   Stacey [name] was seen today for sinusitis.  Diagnoses and all orders for this visit:  Acute frontal sinusitis, recurrence not specified -     betamethasone acetate-betamethasone sodium phosphate (CELESTONE) injection 6 mg; Inject 1 mL (6 mg total) into the muscle once.  Other orders -     cefUROXime (CEFTIN) 500 MG tablet; Take 1 tablet (500 mg total) by mouth 2 (two) times daily with a meal.   I have discontinued Ms. Golaszewski's amoxicillin-clavulanate and doxycycline. I am also having her start on cefUROXime. Additionally, I am having her maintain her multivitamin with minerals, fish oil-omega-3 fatty acids, omeprazole, Vitamin D (Ergocalciferol), hydrochlorothiazide, atorvastatin, fluticasone, potassium chloride, furosemide, and loratadine. We will continue to administer betamethasone acetate-betamethasone sodium phosphate.  Meds ordered this encounter  Medications  . betamethasone acetate-betamethasone sodium phosphate (CELESTONE) injection 6 mg  . cefUROXime (CEFTIN) 500 MG tablet    Sig: Take 1 tablet (500 mg total) by mouth 2 (two) times daily with a meal.    Dispense:  20 tablet    Refill:  0     Follow-up: Return if symptoms worsen or fail to improve.  Mechele ClaudeWarren Ashley Bultema, M.D.

## 2016-05-28 ENCOUNTER — Telehealth: Payer: Self-pay | Admitting: Family Medicine

## 2016-05-28 MED ORDER — FLUCONAZOLE 150 MG PO TABS: 150.0000 mg | ORAL_TABLET | Freq: Once | ORAL | 0 refills | Status: AC

## 2016-05-28 NOTE — Telephone Encounter (Signed)
rx sent to pharmacy as requested per Dr. Darlyn ReadStacks and pt is aware.

## 2016-07-02 ENCOUNTER — Encounter: Payer: Self-pay | Admitting: Family

## 2016-07-02 ENCOUNTER — Ambulatory Visit (INDEPENDENT_AMBULATORY_CARE_PROVIDER_SITE_OTHER): Payer: Medicare Other | Admitting: Family

## 2016-07-02 VITALS — BP 126/83 | HR 97 | Temp 97.2°F | Ht 63.0 in | Wt 198.0 lb

## 2016-07-02 DIAGNOSIS — Z23 Encounter for immunization: Secondary | ICD-10-CM | POA: Diagnosis not present

## 2016-07-02 DIAGNOSIS — K2100 Gastro-esophageal reflux disease with esophagitis, without bleeding: Secondary | ICD-10-CM

## 2016-07-02 DIAGNOSIS — E669 Obesity, unspecified: Secondary | ICD-10-CM

## 2016-07-02 DIAGNOSIS — E559 Vitamin D deficiency, unspecified: Secondary | ICD-10-CM

## 2016-07-02 DIAGNOSIS — R600 Localized edema: Secondary | ICD-10-CM | POA: Diagnosis not present

## 2016-07-02 DIAGNOSIS — E876 Hypokalemia: Secondary | ICD-10-CM

## 2016-07-02 DIAGNOSIS — I10 Essential (primary) hypertension: Secondary | ICD-10-CM

## 2016-07-02 DIAGNOSIS — E8881 Metabolic syndrome: Secondary | ICD-10-CM | POA: Diagnosis not present

## 2016-07-02 NOTE — Patient Instructions (Signed)
Health Maintenance, Female Adopting a healthy lifestyle and getting preventive care can go a long way to promote health and wellness. Talk with your health care provider about what schedule of regular examinations is right for you. This is a good chance for you to check in with your provider about disease prevention and staying healthy. In between checkups, there are plenty of things you can do on your own. Experts have done a lot of research about which lifestyle changes and preventive measures are most likely to keep you healthy. Ask your health care provider for more information. WEIGHT AND DIET  Eat a healthy diet  Be sure to include plenty of vegetables, fruits, low-fat dairy products, and lean protein.  Do not eat a lot of foods high in solid fats, added sugars, or salt.  Get regular exercise. This is one of the most important things you can do for your health.  Most adults should exercise for at least 150 minutes each week. The exercise should increase your heart rate and make you sweat (moderate-intensity exercise).  Most adults should also do strengthening exercises at least twice a week. This is in addition to the moderate-intensity exercise.  Maintain a healthy weight  Body mass index (BMI) is a measurement that can be used to identify possible weight problems. It estimates body fat based on height and weight. Your health care provider can help determine your BMI and help you achieve or maintain a healthy weight.  For females 20 years of age and older:   A BMI below 18.5 is considered underweight.  A BMI of 18.5 to 24.9 is normal.  A BMI of 25 to 29.9 is considered overweight.  A BMI of 30 and above is considered obese.  Watch levels of cholesterol and blood lipids  You should start having your blood tested for lipids and cholesterol at 74 years of age, then have this test every 5 years.  You may need to have your cholesterol levels checked more often if:  Your lipid  or cholesterol levels are high.  You are older than 74 years of age.  You are at high risk for heart disease.  CANCER SCREENING   Lung Cancer  Lung cancer screening is recommended for adults 55-80 years old who are at high risk for lung cancer because of a history of smoking.  A yearly low-dose CT scan of the lungs is recommended for people who:  Currently smoke.  Have quit within the past 15 years.  Have at least a 30-pack-year history of smoking. A pack year is smoking an average of one pack of cigarettes a day for 1 year.  Yearly screening should continue until it has been 15 years since you quit.  Yearly screening should stop if you develop a health problem that would prevent you from having lung cancer treatment.  Breast Cancer  Practice breast self-awareness. This means understanding how your breasts normally appear and feel.  It also means doing regular breast self-exams. Let your health care provider know about any changes, no matter how small.  If you are in your 20s or 30s, you should have a clinical breast exam (CBE) by a health care provider every 1-3 years as part of a regular health exam.  If you are 40 or older, have a CBE every year. Also consider having a breast X-ray (mammogram) every year.  If you have a family history of breast cancer, talk to your health care provider about genetic screening.  If you   are at high risk for breast cancer, talk to your health care provider about having an MRI and a mammogram every year.  Breast cancer gene (BRCA) assessment is recommended for women who have family members with BRCA-related cancers. BRCA-related cancers include:  Breast.  Ovarian.  Tubal.  Peritoneal cancers.  Results of the assessment will determine the need for genetic counseling and BRCA1 and BRCA2 testing. Cervical Cancer Your health care provider may recommend that you be screened regularly for cancer of the pelvic organs (ovaries, uterus, and  vagina). This screening involves a pelvic examination, including checking for microscopic changes to the surface of your cervix (Pap test). You may be encouraged to have this screening done every 3 years, beginning at age 21.  For women ages 30-65, health care providers may recommend pelvic exams and Pap testing every 3 years, or they may recommend the Pap and pelvic exam, combined with testing for human papilloma virus (HPV), every 5 years. Some types of HPV increase your risk of cervical cancer. Testing for HPV may also be done on women of any age with unclear Pap test results.  Other health care providers may not recommend any screening for nonpregnant women who are considered low risk for pelvic cancer and who do not have symptoms. Ask your health care provider if a screening pelvic exam is right for you.  If you have had past treatment for cervical cancer or a condition that could lead to cancer, you need Pap tests and screening for cancer for at least 20 years after your treatment. If Pap tests have been discontinued, your risk factors (such as having a new sexual partner) need to be reassessed to determine if screening should resume. Some women have medical problems that increase the chance of getting cervical cancer. In these cases, your health care provider may recommend more frequent screening and Pap tests. Colorectal Cancer  This type of cancer can be detected and often prevented.  Routine colorectal cancer screening usually begins at 74 years of age and continues through 75 years of age.  Your health care provider may recommend screening at an earlier age if you have risk factors for colon cancer.  Your health care provider may also recommend using home test kits to check for hidden blood in the stool.  A small camera at the end of a tube can be used to examine your colon directly (sigmoidoscopy or colonoscopy). This is done to check for the earliest forms of colorectal  cancer.  Routine screening usually begins at age 50.  Direct examination of the colon should be repeated every 5-10 years through 75 years of age. However, you may need to be screened more often if early forms of precancerous polyps or small growths are found. Skin Cancer  Check your skin from head to toe regularly.  Tell your health care provider about any new moles or changes in moles, especially if there is a change in a mole's shape or color.  Also tell your health care provider if you have a mole that is larger than the size of a pencil eraser.  Always use sunscreen. Apply sunscreen liberally and repeatedly throughout the day.  Protect yourself by wearing long sleeves, pants, a wide-brimmed hat, and sunglasses whenever you are outside. HEART DISEASE, DIABETES, AND HIGH BLOOD PRESSURE   High blood pressure causes heart disease and increases the risk of stroke. High blood pressure is more likely to develop in:  People who have blood pressure in the high end   of the normal range (130-139/85-89 mm Hg).  People who are overweight or obese.  People who are African American.  If you are 38-23 years of age, have your blood pressure checked every 3-5 years. If you are 61 years of age or older, have your blood pressure checked every year. You should have your blood pressure measured twice--once when you are at a hospital or clinic, and once when you are not at a hospital or clinic. Record the average of the two measurements. To check your blood pressure when you are not at a hospital or clinic, you can use:  An automated blood pressure machine at a pharmacy.  A home blood pressure monitor.  If you are between 45 years and 39 years old, ask your health care provider if you should take aspirin to prevent strokes.  Have regular diabetes screenings. This involves taking a blood sample to check your fasting blood sugar level.  If you are at a normal weight and have a low risk for diabetes,  have this test once every three years after 74 years of age.  If you are overweight and have a high risk for diabetes, consider being tested at a younger age or more often. PREVENTING INFECTION  Hepatitis B  If you have a higher risk for hepatitis B, you should be screened for this virus. You are considered at high risk for hepatitis B if:  You were born in a country where hepatitis B is common. Ask your health care provider which countries are considered high risk.  Your parents were born in a high-risk country, and you have not been immunized against hepatitis B (hepatitis B vaccine).  You have HIV or AIDS.  You use needles to inject street drugs.  You live with someone who has hepatitis B.  You have had sex with someone who has hepatitis B.  You get hemodialysis treatment.  You take certain medicines for conditions, including cancer, organ transplantation, and autoimmune conditions. Hepatitis C  Blood testing is recommended for:  Everyone born from [birth year] through 1964.  Anyone with known risk factors for hepatitis C. Sexually transmitted infections (STIs)  You should be screened for sexually transmitted infections (STIs) including gonorrhea and chlamydia if:  You are sexually active and are younger than 74 years of age.  You are older than 74 years of age and your health care provider tells you that you are at risk for this type of infection.  Your sexual activity has changed since you were last screened and you are at an increased risk for chlamydia or gonorrhea. Ask your health care provider if you are at risk.  If you do not have HIV, but are at risk, it may be recommended that you take a prescription medicine daily to prevent HIV infection. This is called pre-exposure prophylaxis (PrEP). You are considered at risk if:  You are sexually active and do not regularly use condoms or know the HIV status of your partner(s).  You take drugs by injection.  You are sexually  active with a partner who has HIV. Talk with your health care provider about whether you are at high risk of being infected with HIV. If you choose to begin PrEP, you should first be tested for HIV. You should then be tested every 3 months for as long as you are taking PrEP.  PREGNANCY   If you are premenopausal and you may become pregnant, ask your health care provider about preconception counseling.  If you may  become pregnant, take 400 to 800 micrograms (mcg) of folic acid every day.  If you want to prevent pregnancy, talk to your health care provider about birth control (contraception). OSTEOPOROSIS AND MENOPAUSE   Osteoporosis is a disease in which the bones lose minerals and strength with aging. This can result in serious bone fractures. Your risk for osteoporosis can be identified using a bone density scan.  If you are 61 years of age or older, or if you are at risk for osteoporosis and fractures, ask your health care provider if you should be screened.  Ask your health care provider whether you should take a calcium or vitamin D supplement to lower your risk for osteoporosis.  Menopause may have certain physical symptoms and risks.  Hormone replacement therapy may reduce some of these symptoms and risks. Talk to your health care provider about whether hormone replacement therapy is right for you.  HOME CARE INSTRUCTIONS   Schedule regular health, dental, and eye exams.  Stay current with your immunizations.   Do not use any tobacco products including cigarettes, chewing tobacco, or electronic cigarettes.  If you are pregnant, do not drink alcohol.  If you are breastfeeding, limit how much and how often you drink alcohol.  Limit alcohol intake to no more than 1 drink per day for nonpregnant women. One drink equals 12 ounces of beer, 5 ounces of wine, or 1 ounces of hard liquor.  Do not use street drugs.  Do not share needles.  Ask your health care provider for help if  you need support or information about quitting drugs.  Tell your health care provider if you often feel depressed.  Tell your health care provider if you have ever been abused or do not feel safe at home.   This information is not intended to replace advice given to you by your health care provider. Make sure you discuss any questions you have with your health care provider.   Document Released: 04/08/2011 Document Revised: 10/14/2014 Document Reviewed: 08/25/2013 Elsevier Interactive Patient Education [employer].

## 2016-07-02 NOTE — Progress Notes (Signed)
Subjective:    Patient ID: Stacey [name], female    DOB: 1941-07-17, 75 y.o.   MRN: [id number]  Pt presents to the office today for chronic follow up.  Hypertension  This is a chronic problem. The current episode started more than 1 year ago. The problem has been resolved since onset. The problem is controlled. Associated symptoms include peripheral edema ("just a little"). Pertinent negatives include no anxiety, headaches, palpitations or shortness of breath. Risk factors for coronary artery disease include obesity, post-menopausal state, sedentary lifestyle, family history and dyslipidemia. Past treatments include diuretics. The current treatment provides significant improvement. There is no history of kidney disease, CAD/MI, CVA, heart failure or a thyroid problem. There is no history of sleep apnea.  Hyperlipidemia  This is a chronic problem. The current episode started more than 1 year ago. The problem is uncontrolled. Recent lipid tests were reviewed and are high. Exacerbating diseases include obesity. Pertinent negatives include no shortness of breath. Current antihyperlipidemic treatment includes diet change. The current treatment provides no improvement of lipids. Risk factors for coronary artery disease include dyslipidemia, obesity, post-menopausal and a sedentary lifestyle.  Gastroesophageal Reflux  She reports no belching, no coughing, no heartburn, no sore throat or no wheezing. This is a chronic problem. The current episode started more than 1 year ago. The problem occurs rarely. The symptoms are aggravated by certain foods. Pertinent negatives include no fatigue. She has tried a PPI for the symptoms. The treatment provided moderate relief.  Peripheral Edema PT currently taking lasix as needed for edema in lower legs. Pt reports trace amount of swelling in bilateral legs that become worse at the end of the day. Pt also takes HCTZ daily that patient states greatly helps.   Hypokalemia PT currently taking potassium chloride 10 meq daily.  Metabolic Syndrome PT can not tolerate statins. Pt states she does not exercise because of pain in her back and legs.   Review of Systems  Constitutional: Negative.  Negative for fatigue.  HENT: Negative.  Negative for sore throat.   Eyes: Negative.   Respiratory: Negative.  Negative for cough, shortness of breath and wheezing.   Cardiovascular: Negative.  Negative for palpitations.  Gastrointestinal: Negative.  Negative for heartburn.  Endocrine: Negative.   Genitourinary: Negative.   Musculoskeletal: Negative.   Neurological: Negative.  Negative for headaches.  Hematological: Negative.   Psychiatric/Behavioral: Negative.   All other systems reviewed and are negative.      Objective:   Physical Exam  Constitutional: She is oriented to person, place, and time. She appears well-developed and well-nourished. No distress.  HENT:  Head: Normocephalic and atraumatic.  Right Ear: External ear normal.  Mouth/Throat: Oropharynx is clear and moist.  Eyes: Pupils are equal, round, and reactive to light.  Neck: Normal range of motion. Neck supple. No thyromegaly present.  Cardiovascular: Normal rate, regular rhythm, normal heart sounds and intact distal pulses.   No murmur heard. Pulmonary/Chest: Effort normal and breath sounds normal. No respiratory distress. She has no wheezes.  Abdominal: Soft. Bowel sounds are normal. She exhibits no distension. There is no tenderness.  Musculoskeletal: Normal range of motion. She exhibits edema (2+ amt in BLE). She exhibits no tenderness.  Neurological: She is alert and oriented to person, place, and time. She has normal reflexes. No cranial nerve deficit.  Skin: Skin is warm and dry.  Psychiatric: She has a normal mood and affect. Her behavior is normal. Judgment and thought content normal.  Vitals reviewed.  BP 126/83   Pulse 97   Temp 97.2 F (36.2 C) (Oral)   Ht _0   (1.6 m)   Wt 198 lb (89.8 kg)   BMI 35.07 kg/m        Assessment & Plan:  1. Encounter for immunization - Flu vaccine HIGH DOSE PF  2. Essential hypertension - CMP14+EGFR  3. Gastroesophageal reflux disease with esophagitis - CMP14+EGFR  4. Hypokalemia - JTT01+XBLT  5. Metabolic syndrome - JQZ00+PQZR  6. Obesity (BMI 30-39.9) - CMP14+EGFR  7. Vitamin D deficiency - CMP14+EGFR  8. Bilateral lower extremity edema - CMP14+EGFR   Continue all meds Labs pending Health Maintenance reviewed Diet and exercise encouraged RTO 6 months  Evelina Dun, FNP

## 2016-07-03 LAB — CMP14+EGFR
ALBUMIN: 4.4 g/dL (ref 3.5–4.8)
ALT: 20 IU/L (ref 0–32)
AST: 22 IU/L (ref 0–40)
Albumin/Globulin Ratio: 1.5 (ref 1.2–2.2)
Alkaline Phosphatase: 69 IU/L (ref 39–117)
BUN/Creatinine Ratio: 19 (ref 12–28)
BUN: 16 mg/dL (ref 8–27)
Bilirubin Total: <0.2 mg/dL (ref 0.0–1.2)
CALCIUM: 10.1 mg/dL (ref 8.7–10.3)
CO2: 28 mmol/L (ref 18–29)
CREATININE: 0.83 mg/dL (ref 0.57–1.00)
Chloride: 97 mmol/L (ref 96–106)
GFR calc Af Amer: 80 mL/min/{1.73_m2} (ref >59)
GFR, EST NON AFRICAN AMERICAN: 70 mL/min/{1.73_m2} (ref >59)
GLOBULIN, TOTAL: 2.9 g/dL (ref 1.5–4.5)
Glucose: 113 mg/dL — ABNORMAL HIGH (ref 65–99)
Potassium: 3.4 mmol/L — ABNORMAL LOW (ref 3.5–5.2)
SODIUM: 142 mmol/L (ref 134–144)
TOTAL PROTEIN: 7.3 g/dL (ref 6.0–8.5)

## 2016-08-19 ENCOUNTER — Other Ambulatory Visit: Payer: Self-pay | Admitting: Family

## 2016-09-12 ENCOUNTER — Encounter: Payer: Self-pay | Admitting: Family

## 2016-09-12 ENCOUNTER — Ambulatory Visit (INDEPENDENT_AMBULATORY_CARE_PROVIDER_SITE_OTHER): Payer: Medicare Other | Admitting: Family

## 2016-09-12 VITALS — BP 119/73 | HR 86 | Temp 96.6°F | Ht 63.0 in | Wt 198.0 lb

## 2016-09-12 DIAGNOSIS — J0110 Acute frontal sinusitis, unspecified: Secondary | ICD-10-CM

## 2016-09-12 MED ORDER — AZITHROMYCIN 250 MG PO TABS: ORAL_TABLET | ORAL | 0 refills | Status: DC

## 2016-09-12 NOTE — Progress Notes (Signed)
   Subjective:    Patient ID: Meryl Dareorothy C Cullifer, female    DOB: 01/29/1941, 75 y.o.   MRN: [id number]  Sinus Problem  This is a new problem. The current episode started in the past 7 days. The problem has been waxing and waning since onset. Her pain is at a severity of 8/10. The pain is mild. Associated symptoms include chills, congestion, coughing, ear pain (right), headaches, a hoarse voice, sinus pressure and a sore throat. Past treatments include acetaminophen. The treatment provided mild relief.  Cough  Associated symptoms include chills, ear pain (right), headaches and a sore throat.      Review of Systems  Constitutional: Positive for chills.  HENT: Positive for congestion, ear pain (right), hoarse voice, sinus pressure and sore throat.   Respiratory: Positive for cough.   Neurological: Positive for headaches.  All other systems reviewed and are negative.      Objective:   Physical Exam  Constitutional: She is oriented to person, place, and time. She appears well-developed and well-nourished. No distress.  HENT:  Head: Normocephalic and atraumatic.  Right Ear: External ear normal.  Nose: Mucosal edema and rhinorrhea present. Right sinus exhibits frontal sinus tenderness. Left sinus exhibits frontal sinus tenderness.  Mouth/Throat: Posterior oropharyngeal erythema present.  Eyes: Pupils are equal, round, and reactive to light.  Neck: Normal range of motion. Neck supple. No thyromegaly present.  Cardiovascular: Normal rate, regular rhythm, normal heart sounds and intact distal pulses.   No murmur heard. Pulmonary/Chest: Effort normal and breath sounds normal. No respiratory distress. She has no wheezes.  Abdominal: Soft. Bowel sounds are normal. She exhibits no distension. There is no tenderness.  Musculoskeletal: Normal range of motion. She exhibits no edema or tenderness.  Neurological: She is alert and oriented to person, place, and time.  Skin: Skin is warm and dry.    Psychiatric: She has a normal mood and affect. Her behavior is normal. Judgment and thought content normal.  Vitals reviewed.     BP 119/73   Pulse 86   Temp (!) 96.6 F (35.9 C) (Oral)   Ht 5\' 3"  (1.6 m)   Wt 198 lb (89.8 kg)   BMI 35.07 kg/m      Assessment & Plan:  1. Acute frontal sinusitis, recurrence not specified - Take meds as prescribed - Use a cool mist humidifier  -Use saline nose sprays frequently -Saline irrigations of the nose can be very helpful if done frequently.  * 4X daily for 1 week*  * Use of a nettie pot can be helpful with this. Follow directions with this* -Force fluids -For any cough or congestion  Use plain Mucinex- regular strength or max strength is fine   * Children- consult with Pharmacist for dosing -For fever or aces or pains- take tylenol or ibuprofen appropriate for age and weight.  * for fevers greater than 101 orally you may alternate ibuprofen and tylenol every  3 hours. -Throat lozenges if help -New toothbrush in 3 days - azithromycin (ZITHROMAX) 250 MG tablet; Take 500 mg once, then 250 mg for four days  Dispense: 6 tablet; Refill: 0  Jannifer Rodneyhristy Nainoa Woldt, FNP

## 2016-09-12 NOTE — Patient Instructions (Signed)

## 2016-11-07 ENCOUNTER — Other Ambulatory Visit: Payer: Self-pay | Admitting: Family

## 2016-11-19 ENCOUNTER — Other Ambulatory Visit: Payer: Self-pay | Admitting: Family

## 2016-12-19 ENCOUNTER — Other Ambulatory Visit: Payer: Self-pay | Admitting: Family

## 2016-12-19 DIAGNOSIS — I10 Essential (primary) hypertension: Secondary | ICD-10-CM

## 2016-12-31 ENCOUNTER — Ambulatory Visit: Payer: Medicare Other | Admitting: Family

## 2017-02-06 ENCOUNTER — Ambulatory Visit (INDEPENDENT_AMBULATORY_CARE_PROVIDER_SITE_OTHER): Payer: Medicare Other | Admitting: Family

## 2017-02-06 ENCOUNTER — Encounter: Payer: Self-pay | Admitting: Family

## 2017-02-06 VITALS — BP 125/77 | HR 95 | Temp 97.0°F | Ht 63.0 in | Wt 196.8 lb

## 2017-02-06 DIAGNOSIS — K2100 Gastro-esophageal reflux disease with esophagitis, without bleeding: Secondary | ICD-10-CM

## 2017-02-06 DIAGNOSIS — E785 Hyperlipidemia, unspecified: Secondary | ICD-10-CM | POA: Diagnosis not present

## 2017-02-06 DIAGNOSIS — E8881 Metabolic syndrome: Secondary | ICD-10-CM

## 2017-02-06 DIAGNOSIS — R600 Localized edema: Secondary | ICD-10-CM

## 2017-02-06 DIAGNOSIS — E669 Obesity, unspecified: Secondary | ICD-10-CM

## 2017-02-06 DIAGNOSIS — E876 Hypokalemia: Secondary | ICD-10-CM | POA: Diagnosis not present

## 2017-02-06 DIAGNOSIS — I10 Essential (primary) hypertension: Secondary | ICD-10-CM

## 2017-02-06 DIAGNOSIS — Z23 Encounter for immunization: Secondary | ICD-10-CM | POA: Diagnosis not present

## 2017-02-06 NOTE — Addendum Note (Signed)
Addended by: Almeta MonasSTONE, Makaio Mach M on: 02/06/2017 02:52 PM   Modules accepted: Orders

## 2017-02-06 NOTE — Patient Instructions (Signed)

## 2017-02-06 NOTE — Progress Notes (Signed)
Subjective:    Patient ID: Stacey [name], female    DOB: 11/08/1940, 76 y.o.   MRN: [id number]  Pt presents to the office today for chronic  Hypertension  This is a chronic problem. The current episode started more than 1 year ago. The problem is controlled. Associated symptoms include peripheral edema. Pertinent negatives include no blurred vision, headaches or shortness of breath. Risk factors for coronary artery disease include dyslipidemia, obesity, post-menopausal state, sedentary lifestyle and family history. The current treatment provides moderate improvement. There is no history of kidney disease.  Gastroesophageal Reflux  She complains of heartburn. She reports no coughing. This is a chronic problem. The current episode started more than 1 year ago. The problem occurs rarely. The problem has been waxing and waning.  Hyperlipidemia  This is a chronic problem. The current episode started more than 1 year ago. The problem is uncontrolled. Associated symptoms include myalgias. Pertinent negatives include no shortness of breath. Current antihyperlipidemic treatment includes diet change. The current treatment provides no improvement of lipids.  Peripheral Edema PT currently taking lasix as needed for edema in lower legs. Pt reports swelling in bilateral ankles and legs that is worse when if she stands all day. She reports the swelling decreases at night when she has them elevated.  Hypokalemia PT currently taking potassium chloride 10 meq daily. Stable Metabolic Syndrome PT can not tolerate statins. Pt states she does not exercise because of pain in her back and legs. States she is not on low fat diet.      Review of Systems  Eyes: Negative for blurred vision.  Respiratory: Negative for cough and shortness of breath.   Cardiovascular: Positive for leg swelling.  Gastrointestinal: Positive for heartburn.  Musculoskeletal: Positive for arthralgias and myalgias.  Neurological: Negative  for headaches.  All other systems reviewed and are negative.      Objective:   Physical Exam  Constitutional: She is oriented to person, place, and time. She appears well-developed and well-nourished. No distress.  HENT:  Head: Normocephalic and atraumatic.  Right Ear: External ear normal.  Mouth/Throat: Oropharynx is clear and moist.  Eyes: Pupils are equal, round, and reactive to light.  Neck: Normal range of motion. Neck supple. No thyromegaly present.  Cardiovascular: Normal rate, regular rhythm, normal heart sounds and intact distal pulses.   No murmur heard. Pulmonary/Chest: Effort normal and breath sounds normal. No respiratory distress. She has no wheezes.  Abdominal: Soft. Bowel sounds are normal. She exhibits no distension. There is no tenderness.  Musculoskeletal: Normal range of motion. She exhibits edema (3+ in bilateral ankles). She exhibits no tenderness.  Neurological: She is alert and oriented to person, place, and time. She has normal reflexes. No cranial nerve deficit.  Skin: Skin is warm and dry.  Psychiatric: She has a normal mood and affect. Her behavior is normal. Judgment and thought content normal.  Vitals reviewed.     BP 125/77   Pulse 95   Temp 97 F (36.1 C) (Oral)   Ht '5\' 3"'  (1.6 m)   Wt 196 lb 12.8 oz (89.3 kg)   BMI 34.86 kg/m      Assessment & Plan:  1. Essential hypertension - CMP14+EGFR  2. Bilateral lower extremity edema -Keep elevated Low salt diet Compression stockings rx given today - CMP14+EGFR - Compression stockings  3. Hypokalemia - NAT55+DDUK  4. Metabolic syndrome - GUR42+HCWC  5. Obesity (BMI 30-39.9) - CMP14+EGFR  6. Gastroesophageal reflux disease with esophagitis - CMP14+EGFR  7. Hyperlipidemia, unspecified hyperlipidemia type - CMP14+EGFR   Continue all meds Labs pending Health Maintenance reviewed- Pneumovax 23 given today Diet and exercise encouraged RTO 6 months   Evelina Dun, FNP

## 2017-02-07 LAB — CMP14+EGFR
ALT: 18 IU/L (ref 0–32)
AST: 22 IU/L (ref 0–40)
Albumin/Globulin Ratio: 1.4 (ref 1.2–2.2)
Albumin: 4.3 g/dL (ref 3.5–4.8)
Alkaline Phosphatase: 64 IU/L (ref 39–117)
BUN/Creatinine Ratio: 18 (ref 12–28)
BUN: 16 mg/dL (ref 8–27)
Bilirubin Total: <0.2 mg/dL (ref 0.0–1.2)
CO2: 28 mmol/L (ref 18–29)
Calcium: 10.4 mg/dL — ABNORMAL HIGH (ref 8.7–10.3)
Chloride: 95 mmol/L — ABNORMAL LOW (ref 96–106)
Creatinine, Ser: 0.89 mg/dL (ref 0.57–1.00)
GFR calc Af Amer: 74 mL/min/1.73
GFR calc non Af Amer: 64 mL/min/1.73
Globulin, Total: 3 g/dL (ref 1.5–4.5)
Glucose: 99 mg/dL (ref 65–99)
Potassium: 3.8 mmol/L (ref 3.5–5.2)
Sodium: 139 mmol/L (ref 134–144)
Total Protein: 7.3 g/dL (ref 6.0–8.5)

## 2017-02-18 ENCOUNTER — Other Ambulatory Visit: Payer: Self-pay | Admitting: Family

## 2017-03-18 ENCOUNTER — Other Ambulatory Visit: Payer: Self-pay | Admitting: Family

## 2017-03-18 DIAGNOSIS — I10 Essential (primary) hypertension: Secondary | ICD-10-CM

## 2017-05-20 ENCOUNTER — Other Ambulatory Visit: Payer: Self-pay | Admitting: Family

## 2017-07-29 DIAGNOSIS — Z23 Encounter for immunization: Secondary | ICD-10-CM | POA: Diagnosis not present

## 2017-08-11 ENCOUNTER — Encounter: Payer: Self-pay | Admitting: Family

## 2017-08-11 ENCOUNTER — Ambulatory Visit (INDEPENDENT_AMBULATORY_CARE_PROVIDER_SITE_OTHER): Payer: Medicare Other | Admitting: Family

## 2017-08-11 VITALS — BP 131/76 | HR 101 | Temp 96.9°F | Ht 63.0 in | Wt 202.8 lb

## 2017-08-11 DIAGNOSIS — H9201 Otalgia, right ear: Secondary | ICD-10-CM | POA: Diagnosis not present

## 2017-08-11 DIAGNOSIS — E559 Vitamin D deficiency, unspecified: Secondary | ICD-10-CM | POA: Diagnosis not present

## 2017-08-11 DIAGNOSIS — K2100 Gastro-esophageal reflux disease with esophagitis, without bleeding: Secondary | ICD-10-CM

## 2017-08-11 DIAGNOSIS — I10 Essential (primary) hypertension: Secondary | ICD-10-CM

## 2017-08-11 DIAGNOSIS — G8929 Other chronic pain: Secondary | ICD-10-CM | POA: Diagnosis not present

## 2017-08-11 DIAGNOSIS — R600 Localized edema: Secondary | ICD-10-CM

## 2017-08-11 DIAGNOSIS — E8881 Metabolic syndrome: Secondary | ICD-10-CM | POA: Diagnosis not present

## 2017-08-11 DIAGNOSIS — M5441 Lumbago with sciatica, right side: Secondary | ICD-10-CM | POA: Diagnosis not present

## 2017-08-11 DIAGNOSIS — E669 Obesity, unspecified: Secondary | ICD-10-CM | POA: Diagnosis not present

## 2017-08-11 DIAGNOSIS — M150 Primary generalized (osteo)arthritis: Secondary | ICD-10-CM | POA: Diagnosis not present

## 2017-08-11 DIAGNOSIS — M549 Dorsalgia, unspecified: Secondary | ICD-10-CM | POA: Insufficient documentation

## 2017-08-11 DIAGNOSIS — M1990 Unspecified osteoarthritis, unspecified site: Secondary | ICD-10-CM | POA: Insufficient documentation

## 2017-08-11 DIAGNOSIS — M159 Polyosteoarthritis, unspecified: Secondary | ICD-10-CM

## 2017-08-11 DIAGNOSIS — E785 Hyperlipidemia, unspecified: Secondary | ICD-10-CM

## 2017-08-11 MED ORDER — TRAMADOL HCL 50 MG PO TABS: 50.0000 mg | ORAL_TABLET | Freq: Two times a day (BID) | ORAL | 2 refills | Status: DC | PRN

## 2017-08-11 MED ORDER — PREDNISONE 10 MG (21) PO TBPK: ORAL_TABLET | ORAL | 0 refills | Status: DC

## 2017-08-11 MED ORDER — POTASSIUM CHLORIDE ER 10 MEQ PO TBCR: 10.0000 meq | EXTENDED_RELEASE_TABLET | Freq: Every day | ORAL | 1 refills | Status: DC

## 2017-08-11 NOTE — Progress Notes (Signed)
Subjective:    Patient ID: Stacey [name], female    DOB: Jun 27, 1941, 76 y.o.   MRN: [id number]  Pt presents to the office today for chronic follow up ans sinus issues.  Hypertension  This is a chronic problem. The current episode started more than 1 year ago. The problem has been resolved since onset. The problem is controlled. Associated symptoms include peripheral edema. Pertinent negatives include no headaches, malaise/fatigue or shortness of breath. Risk factors for coronary artery disease include dyslipidemia, obesity and sedentary lifestyle. The current treatment provides moderate improvement. There is no history of kidney disease, CAD/MI, CVA or heart failure.  Hyperlipidemia  This is a chronic problem. The current episode started more than 1 year ago. The problem is uncontrolled. Recent lipid tests were reviewed and are high. Exacerbating diseases include obesity. Associated symptoms include leg pain. Pertinent negatives include no shortness of breath. Current antihyperlipidemic treatment includes herbal therapy. The current treatment provides mild improvement of lipids. Risk factors for coronary artery disease include diabetes mellitus, dyslipidemia, obesity, hypertension, post-menopausal and a sedentary lifestyle.  Gastroesophageal Reflux  She complains of a sore throat. She reports no belching, no coughing, no dysphagia or no heartburn. This is a chronic problem. The current episode started more than 1 year ago. The problem occurs occasionally. The problem has been resolved. Risk factors include obesity. She has tried a PPI for the symptoms. The treatment provided moderate relief.  Back Pain  This is a chronic problem. The current episode started more than 1 year ago. The problem occurs intermittently. The problem has been waxing and waning since onset. The pain is present in the lumbar spine. The quality of the pain is described as aching. The pain is at a severity of 7/10. The pain is  moderate. The symptoms are aggravated by standing. Associated symptoms include leg pain, numbness and weakness. Pertinent negatives include no bladder incontinence, bowel incontinence or headaches. Risk factors include menopause and obesity. She has tried analgesics, bed rest and ice for the symptoms. The treatment provided mild relief.  Arthritis  Presents for follow-up visit. She complains of pain and stiffness. The symptoms have been stable. Affected locations include the right MCP, left MCP, right knee and left knee. Her pain is at a severity of 10/10.  Otalgia   There is pain in the right ear. This is a new problem. The current episode started 1 to 4 weeks ago. The problem occurs every few minutes. The problem has been waxing and waning. There has been no fever. The pain is moderate. Associated symptoms include rhinorrhea and a sore throat. Pertinent negatives include no coughing or headaches. She has tried NSAIDs for the symptoms. The treatment provided mild relief.  Peripheral Edema Pt taking Lasix as needed. Stable.  Metabolic Syndrome Pt is not active. Pt does not take a statin related to muscle pain.    Review of Systems  Constitutional: Negative for malaise/fatigue.  HENT: Positive for ear pain, rhinorrhea and sore throat.   Respiratory: Negative for cough and shortness of breath.   Gastrointestinal: Negative for bowel incontinence, dysphagia and heartburn.  Genitourinary: Negative for bladder incontinence.  Musculoskeletal: Positive for arthritis, back pain and stiffness.  Neurological: Positive for weakness and numbness. Negative for headaches.  All other systems reviewed and are negative.      Objective:   Physical Exam  Constitutional: She is oriented to person, place, and time. She appears well-developed and well-nourished. No distress.  HENT:  Head: Normocephalic and atraumatic.  Right Ear: External ear normal. No drainage or swelling. Tympanic membrane is bulging.  Tympanic membrane is not scarred, not perforated and not erythematous. No middle ear effusion.  Left Ear: External ear normal.  Mouth/Throat: Oropharynx is clear and moist.  Eyes: Pupils are equal, round, and reactive to light.  Neck: Normal range of motion. Neck supple. No thyromegaly present.  Cardiovascular: Normal rate, regular rhythm, normal heart sounds and intact distal pulses.  No murmur heard. Pulmonary/Chest: Effort normal and breath sounds normal. No respiratory distress. She has no wheezes.  Abdominal: Soft. Bowel sounds are normal. She exhibits no distension. There is no tenderness.  Musculoskeletal: Normal range of motion. She exhibits no edema or tenderness.  Neurological: She is alert and oriented to person, place, and time. No cranial nerve deficit.  Skin: Skin is warm and dry.  Psychiatric: She has a normal mood and affect. Her behavior is normal. Judgment and thought content normal.  Vitals reviewed.     BP 131/76   Pulse (!) 101   Temp (!) 96.9 F (36.1 C) (Oral)   Ht '5\' 3"'  (1.6 m)   Wt 202 lb 12.8 oz (92 kg)   BMI 35.92 kg/m      Assessment & Plan:  1. Essential hypertension - CMP14+EGFR  2. Hyperlipidemia, unspecified hyperlipidemia type - CMP14+EGFR - Lipid panel  3. Obesity (BMI 30-39.9) - ZOX09+UEAV  4. Metabolic syndrome  - WUJ81+XBJY  5. Vitamin D deficiency - CMP14+EGFR  6. Gastroesophageal reflux disease with esophagitis - CMP14+EGFR  7. Bilateral lower extremity edema  - CMP14+EGFR  8. Primary osteoarthritis involving multiple joints Will do prednisone and start Ultram today Pt reviewed in [place] controlled database and has not received any controlled medications from anyone in last 2 years - CMP14+EGFR - predniSONE (STERAPRED UNI-PAK 21 TAB) 10 MG (21) TBPK tablet; Use as directed  Dispense: 21 tablet; Refill: 0 - traMADol (ULTRAM) 50 MG tablet; Take 1 tablet (50 mg total) every 12 (twelve) hours as needed by mouth.  Dispense: 60  tablet; Refill: 2  9. Chronic bilateral low back pain with right-sided sciatica - CMP14+EGFR - predniSONE (STERAPRED UNI-PAK 21 TAB) 10 MG (21) TBPK tablet; Use as directed  Dispense: 21 tablet; Refill: 0 - traMADol (ULTRAM) 50 MG tablet; Take 1 tablet (50 mg total) every 12 (twelve) hours as needed by mouth.  Dispense: 60 tablet; Refill: 2  10. Right ear pain - Ambulatory referral to ENT - predniSONE (STERAPRED UNI-PAK 21 TAB) 10 MG (21) TBPK tablet; Use as directed  Dispense: 21 tablet; Refill: 0   Continue all meds Labs pending Health Maintenance reviewed Diet and exercise encouraged RTO 6 months   Evelina Dun, FNP

## 2017-08-11 NOTE — Patient Instructions (Signed)

## 2017-08-12 ENCOUNTER — Other Ambulatory Visit: Payer: Self-pay | Admitting: Family

## 2017-08-12 LAB — CMP14+EGFR
ALBUMIN: 4.3 g/dL (ref 3.5–4.8)
ALK PHOS: 76 IU/L (ref 39–117)
ALT: 29 IU/L (ref 0–32)
AST: 26 IU/L (ref 0–40)
Albumin/Globulin Ratio: 1.5 (ref 1.2–2.2)
BILIRUBIN TOTAL: 0.2 mg/dL (ref 0.0–1.2)
BUN / CREAT RATIO: 16 (ref 12–28)
BUN: 14 mg/dL (ref 8–27)
CHLORIDE: 95 mmol/L — AB (ref 96–106)
CO2: 28 mmol/L (ref 20–29)
CREATININE: 0.87 mg/dL (ref 0.57–1.00)
Calcium: 9.9 mg/dL (ref 8.7–10.3)
GFR calc Af Amer: 75 mL/min/{1.73_m2} (ref >59)
GFR calc non Af Amer: 65 mL/min/{1.73_m2} (ref >59)
GLUCOSE: 100 mg/dL — AB (ref 65–99)
Globulin, Total: 2.8 g/dL (ref 1.5–4.5)
Potassium: 3.2 mmol/L — ABNORMAL LOW (ref 3.5–5.2)
Sodium: 139 mmol/L (ref 134–144)
TOTAL PROTEIN: 7.1 g/dL (ref 6.0–8.5)

## 2017-08-12 LAB — LIPID PANEL
CHOLESTEROL TOTAL: 221 mg/dL — AB (ref 100–199)
Chol/HDL Ratio: 3.9 ratio (ref 0.0–4.4)
HDL: 56 mg/dL (ref >39)
LDL CALC: 126 mg/dL — AB (ref 0–99)
Triglycerides: 194 mg/dL — ABNORMAL HIGH (ref 0–149)
VLDL CHOLESTEROL CAL: 39 mg/dL (ref 5–40)

## 2017-08-12 MED ORDER — POTASSIUM CHLORIDE 20 MEQ PO PACK: 20.0000 meq | PACK | Freq: Every day | ORAL | 1 refills | Status: DC

## 2017-08-13 ENCOUNTER — Telehealth: Payer: Self-pay | Admitting: Family

## 2017-08-14 MED ORDER — POTASSIUM CHLORIDE ER 10 MEQ PO TBCR: 10.0000 meq | EXTENDED_RELEASE_TABLET | Freq: Two times a day (BID) | ORAL | 1 refills | Status: DC

## 2017-08-14 NOTE — Telephone Encounter (Signed)
Pt aware.

## 2017-08-14 NOTE — Telephone Encounter (Signed)
Changed her rx back to Potassium Chloride 10 meq BID

## 2017-09-03 ENCOUNTER — Ambulatory Visit: Payer: Medicare Other | Admitting: Family Medicine

## 2017-09-04 ENCOUNTER — Other Ambulatory Visit: Payer: Self-pay | Admitting: Family

## 2017-09-04 DIAGNOSIS — I10 Essential (primary) hypertension: Secondary | ICD-10-CM

## 2017-10-28 ENCOUNTER — Encounter: Payer: Self-pay | Admitting: Family

## 2017-10-28 ENCOUNTER — Ambulatory Visit (INDEPENDENT_AMBULATORY_CARE_PROVIDER_SITE_OTHER): Payer: Medicare Other | Admitting: Family

## 2017-10-28 VITALS — BP 137/79 | HR 106 | Temp 96.8°F | Ht 63.0 in | Wt 201.4 lb

## 2017-10-28 DIAGNOSIS — J029 Acute pharyngitis, unspecified: Secondary | ICD-10-CM

## 2017-10-28 DIAGNOSIS — J020 Streptococcal pharyngitis: Secondary | ICD-10-CM

## 2017-10-28 LAB — CULTURE, GROUP A STREP

## 2017-10-28 LAB — RAPID STREP SCREEN (MED CTR MEBANE ONLY): STREP GP A AG, IA W/REFLEX: NEGATIVE

## 2017-10-28 MED ORDER — FLUCONAZOLE 150 MG PO TABS: 150.0000 mg | ORAL_TABLET | ORAL | 0 refills | Status: DC | PRN

## 2017-10-28 MED ORDER — AMOXICILLIN 500 MG PO CAPS: 500.0000 mg | ORAL_CAPSULE | Freq: Two times a day (BID) | ORAL | 0 refills | Status: DC

## 2017-10-28 NOTE — Addendum Note (Signed)
Addended by: Jannifer RodneyHAWKS, CHRISTY A on: 10/28/2017 03:37 PM   Modules accepted: Orders

## 2017-10-28 NOTE — Progress Notes (Signed)
   Subjective:    Patient ID: Stacey [name], female    DOB: 03/05/1941, 77 y.o.   MRN: [id number]   Sore Throat   This is a new problem. The current episode started in the past 7 days. The problem has been gradually worsening. There has been no fever. The pain is at a severity of 8/10. The pain is moderate. Associated symptoms include congestion, coughing, headaches and a hoarse voice. Pertinent negatives include no ear pain. She has tried acetaminophen and NSAIDs for the symptoms. The treatment provided mild relief.      Review of Systems  HENT: Positive for congestion and hoarse voice. Negative for ear pain.   Respiratory: Positive for cough.   Neurological: Positive for headaches.  All other systems reviewed and are negative.      Objective:   Physical Exam  Constitutional: She is oriented to person, place, and time. She appears well-developed and well-nourished. No distress.  HENT:  Head: Normocephalic and atraumatic.  Right Ear: External ear normal.  Left Ear: External ear normal.  Mouth/Throat: Oropharyngeal exudate, posterior oropharyngeal edema and posterior oropharyngeal erythema present.  Eyes: Pupils are equal, round, and reactive to light.  Neck: Normal range of motion. Neck supple. No thyromegaly present.  Cardiovascular: Normal rate, regular rhythm, normal heart sounds and intact distal pulses.  No murmur heard. Pulmonary/Chest: Effort normal and breath sounds normal. No respiratory distress. She has no wheezes.  Abdominal: Soft. Bowel sounds are normal. She exhibits no distension. There is no tenderness.  Musculoskeletal: Normal range of motion. She exhibits no edema or tenderness.  Neurological: She is alert and oriented to person, place, and time. She has normal reflexes. No cranial nerve deficit.  Skin: Skin is warm and dry.  Psychiatric: She has a normal mood and affect. Her behavior is normal. Judgment and thought content normal.  Vitals reviewed.    BP  137/79   Pulse (!) 106   Temp (!) 96.8 F (36 C) (Oral)   Ht 5\' 3"  (1.6 m)   Wt 201 lb 6.4 oz (91.4 kg)   BMI 35.68 kg/m      Assessment & Plan:  1. Streptococcal sore throat - Take meds as prescribed - Use a cool mist humidifier  -Force fluids -For any cough or congestion  Use plain Mucinex- regular strength or max strength is fine -For fever or aces or pains- take tylenol or ibuprofen appropriate for age and weight. -Throat lozenges if help -New toothbrush in 3 days - amoxicillin (AMOXIL) 500 MG capsule; Take 1 capsule (500 mg total) by mouth 2 (two) times daily.  Dispense: 30 capsule; Refill: 0  2. Sore throat - Rapid Strep Screen (Not at [facility])   Jannifer Rodneyhristy Grisela Mesch, FNP

## 2017-10-28 NOTE — Patient Instructions (Signed)
Strep Throat Strep throat is a bacterial infection of the throat. Your health care provider may call the infection tonsillitis or pharyngitis, depending on whether there is swelling in the tonsils or at the back of the throat. Strep throat is most common during the cold months of the year in children who are 5-76 years of age, but it can happen during any season in people of any age. This infection is spread from person to person (contagious) through coughing, sneezing, or close contact. What are the causes? Strep throat is caused by the bacteria called Streptococcus pyogenes. What increases the risk? This condition is more likely to develop in:  People who spend time in crowded places where the infection can spread easily.  People who have close contact with someone who has strep throat.  What are the signs or symptoms? Symptoms of this condition include:  Fever or chills.  Redness, swelling, or pain in the tonsils or throat.  Pain or difficulty when swallowing.  White or yellow spots on the tonsils or throat.  Swollen, tender glands in the neck or under the jaw.  Red rash all over the body (rare).  How is this diagnosed? This condition is diagnosed by performing a rapid strep test or by taking a swab of your throat (throat culture test). Results from a rapid strep test are usually ready in a few minutes, but throat culture test results are available after one or two days. How is this treated? This condition is treated with antibiotic medicine. Follow these instructions at home: Medicines  Take over-the-counter and prescription medicines only as told by your health care provider.  Take your antibiotic as told by your health care provider. Do not stop taking the antibiotic even if you start to feel better.  Have family members who also have a sore throat or fever tested for strep throat. They may need antibiotics if they have the strep infection. Eating and drinking  Do not  share food, drinking cups, or personal items that could cause the infection to spread to other people.  If swallowing is difficult, try eating soft foods until your sore throat feels better.  Drink enough fluid to keep your urine clear or pale yellow. General instructions  Gargle with a salt-water mixture 3-4 times per day or as needed. To make a salt-water mixture, completely dissolve -1 tsp of salt in 1 cup of warm water.  Make sure that all household members wash their hands well.  Get plenty of rest.  Stay home from school or work until you have been taking antibiotics for 24 hours.  Keep all follow-up visits as told by your health care provider. This is important. Contact a health care provider if:  The glands in your neck continue to get bigger.  You develop a rash, cough, or earache.  You cough up a thick liquid that is green, yellow-brown, or bloody.  You have pain or discomfort that does not get better with medicine.  Your problems seem to be getting worse rather than better.  You have a fever. Get help right away if:  You have new symptoms, such as vomiting, severe headache, stiff or painful neck, chest pain, or shortness of breath.  You have severe throat pain, drooling, or changes in your voice.  You have swelling of the neck, or the skin on the neck becomes red and tender.  You have signs of dehydration, such as fatigue, dry mouth, and decreased urination.  You become increasingly sleepy, or   you cannot wake up completely.  Your joints become red or painful. This information is not intended to replace advice given to you by your health care provider. Make sure you discuss any questions you have with your health care provider. Document Released: 09/20/2000 Document Revised: 05/22/2016 Document Reviewed: 01/16/2015 Elsevier Interactive Patient Education  2018 [employer]  

## 2017-11-07 ENCOUNTER — Ambulatory Visit: Payer: Medicare Other | Admitting: Family

## 2017-11-11 ENCOUNTER — Ambulatory Visit: Payer: Medicare Other | Admitting: Family

## 2017-11-19 ENCOUNTER — Ambulatory Visit (INDEPENDENT_AMBULATORY_CARE_PROVIDER_SITE_OTHER): Payer: Medicare Other | Admitting: Family

## 2017-11-19 ENCOUNTER — Encounter: Payer: Self-pay | Admitting: Family

## 2017-11-19 VITALS — BP 112/77 | HR 101 | Temp 97.0°F | Ht 63.0 in | Wt 201.6 lb

## 2017-11-19 DIAGNOSIS — E669 Obesity, unspecified: Secondary | ICD-10-CM | POA: Diagnosis not present

## 2017-11-19 DIAGNOSIS — K2100 Gastro-esophageal reflux disease with esophagitis, without bleeding: Secondary | ICD-10-CM

## 2017-11-19 DIAGNOSIS — M5441 Lumbago with sciatica, right side: Secondary | ICD-10-CM

## 2017-11-19 DIAGNOSIS — E559 Vitamin D deficiency, unspecified: Secondary | ICD-10-CM | POA: Diagnosis not present

## 2017-11-19 DIAGNOSIS — E785 Hyperlipidemia, unspecified: Secondary | ICD-10-CM | POA: Diagnosis not present

## 2017-11-19 DIAGNOSIS — M150 Primary generalized (osteo)arthritis: Secondary | ICD-10-CM | POA: Diagnosis not present

## 2017-11-19 DIAGNOSIS — I10 Essential (primary) hypertension: Secondary | ICD-10-CM | POA: Diagnosis not present

## 2017-11-19 DIAGNOSIS — M159 Polyosteoarthritis, unspecified: Secondary | ICD-10-CM

## 2017-11-19 DIAGNOSIS — G8929 Other chronic pain: Secondary | ICD-10-CM | POA: Diagnosis not present

## 2017-11-19 NOTE — Patient Instructions (Signed)

## 2017-11-19 NOTE — Progress Notes (Signed)
   Subjective:    Patient ID: Stacey [name], female    DOB: 11-30-1940, 77 y.o.   MRN: [id number]  Pt presents to the office today for chronic follow up.  Hypertension  This is a chronic problem. The current episode started more than 1 year ago. The problem has been resolved since onset. The problem is controlled. Associated symptoms include peripheral edema ("maybe a little"). Pertinent negatives include no headaches or shortness of breath. Risk factors for coronary artery disease include diabetes mellitus, dyslipidemia, obesity and sedentary lifestyle. The current treatment provides moderate improvement.  Hyperlipidemia  This is a chronic problem. The current episode started more than 1 year ago. The problem is uncontrolled. Recent lipid tests were reviewed and are high. Exacerbating diseases include obesity. Pertinent negatives include no shortness of breath. Current antihyperlipidemic treatment includes diet change. The current treatment provides no improvement of lipids.  Gastroesophageal Reflux  She complains of heartburn. She reports no belching or no dysphagia. This is a chronic problem. The current episode started more than 1 year ago. The problem occurs occasionally. The problem has been waxing and waning. She has tried a PPI for the symptoms. The treatment provided moderate relief.  Arthritis  Presents for follow-up visit. Affected locations include the right knee and left knee (back). Her pain is at a severity of 7/10.      Review of Systems  Respiratory: Negative for shortness of breath.   Gastrointestinal: Positive for heartburn. Negative for dysphagia.  Musculoskeletal: Positive for arthritis.  Neurological: Negative for headaches.  All other systems reviewed and are negative.      Objective:   Physical Exam  Constitutional: She is oriented to person, place, and time. She appears well-developed and well-nourished. No distress.  HENT:  Head: Normocephalic and atraumatic.    Right Ear: External ear normal.  Left Ear: External ear normal.  Nose: Nose normal.  Mouth/Throat: Oropharynx is clear and moist.  Eyes: Pupils are equal, round, and reactive to light.  Neck: Normal range of motion. Neck supple. No thyromegaly present.  Cardiovascular: Normal rate, regular rhythm, normal heart sounds and intact distal pulses.  No murmur heard. Pulmonary/Chest: Effort normal and breath sounds normal. No respiratory distress. She has no wheezes.  Abdominal: Soft. Bowel sounds are normal. She exhibits no distension. There is no tenderness.  Musculoskeletal: Normal range of motion. She exhibits edema (trace in right foot). She exhibits no tenderness.  Neurological: She is alert and oriented to person, place, and time.  Skin: Skin is warm and dry.  Psychiatric: She has a normal mood and affect. Her behavior is normal. Judgment and thought content normal.  Vitals reviewed.     BP 112/77   Pulse (!) 101   Temp (!) 97 F (36.1 C) (Oral)   Ht '5\' 3"'$  (1.6 m)   Wt 201 lb 9.6 oz (91.4 kg)   BMI 35.71 kg/m      Assessment & Plan:  1. Essential hypertension - CMP14+EGFR  2. Gastroesophageal reflux disease with esophagitis - CMP14+EGFR  3. Primary osteoarthritis involving multiple joints  - CMP14+EGFR  4. Vitamin D deficiency - CMP14+EGFR  5. Obesity (BMI 30-39.9)  - CMP14+EGFR  6. Hyperlipidemia, unspecified hyperlipidemia type  - CMP14+EGFR  7. Chronic bilateral low back pain with right-sided sciatica  - CMP14+EGFR   Continue all meds Labs pending Health Maintenance reviewed Diet and exercise encouraged RTO 6 months   Evelina Dun, FNP

## 2017-11-20 LAB — CMP14+EGFR
ALK PHOS: 71 IU/L (ref 39–117)
ALT: 16 IU/L (ref 0–32)
AST: 21 IU/L (ref 0–40)
Albumin/Globulin Ratio: 1.4 (ref 1.2–2.2)
Albumin: 4.2 g/dL (ref 3.5–4.8)
BUN / CREAT RATIO: 14 (ref 12–28)
BUN: 13 mg/dL (ref 8–27)
Bilirubin Total: <0.2 mg/dL (ref 0.0–1.2)
CO2: 25 mmol/L (ref 20–29)
CREATININE: 0.91 mg/dL (ref 0.57–1.00)
Calcium: 9.7 mg/dL (ref 8.7–10.3)
Chloride: 95 mmol/L — ABNORMAL LOW (ref 96–106)
GFR calc Af Amer: 71 mL/min/{1.73_m2} (ref >59)
GFR calc non Af Amer: 62 mL/min/{1.73_m2} (ref >59)
GLOBULIN, TOTAL: 3.1 g/dL (ref 1.5–4.5)
GLUCOSE: 116 mg/dL — AB (ref 65–99)
Potassium: 3.5 mmol/L (ref 3.5–5.2)
SODIUM: 138 mmol/L (ref 134–144)
Total Protein: 7.3 g/dL (ref 6.0–8.5)

## 2017-12-05 ENCOUNTER — Telehealth: Payer: Self-pay | Admitting: Family

## 2017-12-05 NOTE — Telephone Encounter (Signed)
Aware of lab notes- wanting to know when she needs to come back in to be seen. States it is normally 6 months but it was 3 the last time. Please advise

## 2017-12-05 NOTE — Telephone Encounter (Signed)
Aware.Need to be seen every three months when taking pain medications.

## 2017-12-05 NOTE — Telephone Encounter (Signed)
Refer to lab note °

## 2017-12-15 ENCOUNTER — Other Ambulatory Visit: Payer: Self-pay | Admitting: Family

## 2018-02-18 ENCOUNTER — Telehealth: Payer: Self-pay | Admitting: Family

## 2018-02-18 MED ORDER — POTASSIUM CHLORIDE ER 10 MEQ PO TBCR: 10.0000 meq | EXTENDED_RELEASE_TABLET | Freq: Two times a day (BID) | ORAL | 0 refills | Status: DC

## 2018-02-18 NOTE — Telephone Encounter (Signed)
Pt is out of her potassium chloride (K-DUR) 10 MEQ tablet she uses [facility]

## 2018-02-18 NOTE — Telephone Encounter (Signed)
Pt aware refill sent to the pharmacy

## 2018-02-19 ENCOUNTER — Ambulatory Visit: Payer: Medicare Other | Admitting: Family

## 2018-02-26 ENCOUNTER — Ambulatory Visit (INDEPENDENT_AMBULATORY_CARE_PROVIDER_SITE_OTHER): Payer: Medicare Other | Admitting: Family

## 2018-02-26 ENCOUNTER — Encounter: Payer: Self-pay | Admitting: Family

## 2018-02-26 VITALS — BP 131/83 | HR 90 | Temp 97.3°F | Ht 63.0 in | Wt 199.0 lb

## 2018-02-26 DIAGNOSIS — E559 Vitamin D deficiency, unspecified: Secondary | ICD-10-CM

## 2018-02-26 DIAGNOSIS — M150 Primary generalized (osteo)arthritis: Secondary | ICD-10-CM

## 2018-02-26 DIAGNOSIS — M5441 Lumbago with sciatica, right side: Secondary | ICD-10-CM | POA: Diagnosis not present

## 2018-02-26 DIAGNOSIS — K2100 Gastro-esophageal reflux disease with esophagitis, without bleeding: Secondary | ICD-10-CM

## 2018-02-26 DIAGNOSIS — G8929 Other chronic pain: Secondary | ICD-10-CM

## 2018-02-26 DIAGNOSIS — E8881 Metabolic syndrome: Secondary | ICD-10-CM | POA: Diagnosis not present

## 2018-02-26 DIAGNOSIS — M159 Polyosteoarthritis, unspecified: Secondary | ICD-10-CM

## 2018-02-26 DIAGNOSIS — I10 Essential (primary) hypertension: Secondary | ICD-10-CM

## 2018-02-26 DIAGNOSIS — E876 Hypokalemia: Secondary | ICD-10-CM

## 2018-02-26 DIAGNOSIS — E785 Hyperlipidemia, unspecified: Secondary | ICD-10-CM | POA: Diagnosis not present

## 2018-02-26 NOTE — Progress Notes (Signed)
Subjective:    Patient ID: Stacey [name], female    DOB: [birth year], 77 y.o.   MRN: [id number]  Chief Complaint  Patient presents with  . Medical Management of Chronic Issues    three month recheck    Hypertension  This is a chronic problem. The current episode started more than 1 year ago. The problem has been resolved since onset. The problem is controlled. Associated symptoms include peripheral edema ("at times"). Pertinent negatives include no headaches, malaise/fatigue or shortness of breath. Risk factors for coronary artery disease include dyslipidemia, obesity, post-menopausal state and sedentary lifestyle. There is no history of kidney disease or CAD/MI.  Hyperlipidemia  This is a chronic problem. The current episode started more than 1 year ago. Recent lipid tests were reviewed and are high. Pertinent negatives include no shortness of breath. Current antihyperlipidemic treatment includes diet change. Risk factors for coronary artery disease include dyslipidemia, obesity, post-menopausal and a sedentary lifestyle.  Gastroesophageal Reflux  She reports no belching, no coughing or no heartburn. This is a chronic problem. The current episode started more than 1 year ago. The problem occurs occasionally. She has tried a PPI for the symptoms. The treatment provided moderate relief.  Arthritis  Presents for follow-up visit. She complains of pain and stiffness. The symptoms have been worsening. Affected locations include the right knee, left knee, right MCP and left MCP (back). Her pain is at a severity of 10/10.  Back Pain  This is a chronic problem. The current episode started more than 1 year ago. The problem occurs intermittently. The problem has been waxing and waning since onset. The pain is present in the lumbar spine. The pain is at a severity of 3/10. The pain is mild. Pertinent negatives include no headaches.  Metabolic Syndrome Pt does not exercise related to pain. States she  tries to watch what she eats.     Review of Systems  Constitutional: Negative for malaise/fatigue.  Respiratory: Negative for cough and shortness of breath.   Gastrointestinal: Negative for heartburn.  Musculoskeletal: Positive for arthritis, back pain and stiffness.  Neurological: Negative for headaches.  All other systems reviewed and are negative.      Objective:   Physical Exam  Constitutional: She is oriented to person, place, and time. She appears well-developed and well-nourished. No distress.  HENT:  Head: Normocephalic and atraumatic.  Right Ear: External ear normal.  Mouth/Throat: Oropharynx is clear and moist.  Eyes: Pupils are equal, round, and reactive to light.  Neck: Normal range of motion. Neck supple. No thyromegaly present.  Cardiovascular: Normal rate, regular rhythm, normal heart sounds and intact distal pulses.  No murmur heard. Pulmonary/Chest: Effort normal and breath sounds normal. No respiratory distress. She has no wheezes.  Abdominal: Soft. Bowel sounds are normal. She exhibits no distension. There is no tenderness.  Musculoskeletal: Normal range of motion. She exhibits edema (trace BLE). She exhibits no tenderness.  Neurological: She is alert and oriented to person, place, and time. She has normal reflexes. No cranial nerve deficit.  Skin: Skin is warm and dry.  Psychiatric: She has a normal mood and affect. Her behavior is normal. Judgment and thought content normal.  Vitals reviewed.     BP 131/83   Pulse 90   Temp (!) 97.3 F (36.3 C) (Oral)   Ht '5\' 3"'  (1.6 m)   Wt 199 lb (90.3 kg)   BMI 35.25 kg/m      Assessment & Plan:  [name] comes  in today with chief complaint of Medical Management of Chronic Issues (three month recheck)   Diagnosis and orders addressed:  1. Gastroesophageal reflux disease with esophagitis - CMP14+EGFR  2. Essential hypertension - CMP14+EGFR  3. Primary osteoarthritis involving multiple joints -  CMP14+EGFR  4. Hypokalemia - CMP14+EGFR  5. Chronic bilateral low back pain with right-sided sciatica - CMP14+EGFR  6. Hyperlipidemia, unspecified hyperlipidemia type - CMP14+EGFR - Lipid panel  7. Morbid obesity ([place] - EXO60+CGBK  8. Metabolic syndrome - ORJ08+LUDA  9. Vitamin D deficiency - CMP14+EGFR - VITAMIN D 25 Hydroxy (Vit-D Deficiency, Fractures)   Labs pending Health Maintenance reviewed Diet and exercise encouraged  Follow up plan: 6 months    Evelina Dun, FNP

## 2018-02-26 NOTE — Patient Instructions (Signed)

## 2018-02-27 LAB — CMP14+EGFR
A/G RATIO: 1.4 (ref 1.2–2.2)
ALBUMIN: 4.3 g/dL (ref 3.5–4.8)
ALT: 17 IU/L (ref 0–32)
AST: 19 IU/L (ref 0–40)
Alkaline Phosphatase: 67 IU/L (ref 39–117)
BUN/Creatinine Ratio: 19 (ref 12–28)
BUN: 17 mg/dL (ref 8–27)
Bilirubin Total: <0.2 mg/dL (ref 0.0–1.2)
CALCIUM: 9.4 mg/dL (ref 8.7–10.3)
CO2: 27 mmol/L (ref 20–29)
CREATININE: 0.9 mg/dL (ref 0.57–1.00)
Chloride: 98 mmol/L (ref 96–106)
GFR, EST AFRICAN AMERICAN: 72 mL/min/{1.73_m2} (ref >59)
GFR, EST NON AFRICAN AMERICAN: 63 mL/min/{1.73_m2} (ref >59)
GLOBULIN, TOTAL: 3 g/dL (ref 1.5–4.5)
Glucose: 101 mg/dL — ABNORMAL HIGH (ref 65–99)
POTASSIUM: 3.5 mmol/L (ref 3.5–5.2)
SODIUM: 141 mmol/L (ref 134–144)
TOTAL PROTEIN: 7.3 g/dL (ref 6.0–8.5)

## 2018-02-27 LAB — LIPID PANEL
CHOL/HDL RATIO: 4.1 ratio (ref 0.0–4.4)
Cholesterol, Total: 224 mg/dL — ABNORMAL HIGH (ref 100–199)
HDL: 54 mg/dL (ref >39)
LDL Calculated: 140 mg/dL — ABNORMAL HIGH (ref 0–99)
TRIGLYCERIDES: 151 mg/dL — AB (ref 0–149)
VLDL Cholesterol Cal: 30 mg/dL (ref 5–40)

## 2018-02-27 LAB — VITAMIN D 25 HYDROXY (VIT D DEFICIENCY, FRACTURES): Vit D, 25-Hydroxy: 28.6 ng/mL — ABNORMAL LOW (ref 30.0–100.0)

## 2018-03-03 ENCOUNTER — Other Ambulatory Visit: Payer: Self-pay | Admitting: Family

## 2018-03-03 MED ORDER — VITAMIN D (ERGOCALCIFEROL) 1.25 MG (50000 UNIT) PO CAPS: 50000.0000 [IU] | ORAL_CAPSULE | ORAL | 3 refills | Status: DC

## 2018-03-09 ENCOUNTER — Other Ambulatory Visit: Payer: Self-pay | Admitting: Family

## 2018-03-09 DIAGNOSIS — I10 Essential (primary) hypertension: Secondary | ICD-10-CM

## 2018-04-20 ENCOUNTER — Other Ambulatory Visit: Payer: Self-pay | Admitting: Family

## 2018-06-02 ENCOUNTER — Other Ambulatory Visit: Payer: Self-pay | Admitting: Family

## 2018-06-02 DIAGNOSIS — I10 Essential (primary) hypertension: Secondary | ICD-10-CM

## 2018-06-02 MED ORDER — HYDROCHLOROTHIAZIDE 25 MG PO TABS: 25.0000 mg | ORAL_TABLET | Freq: Every day | ORAL | 0 refills | Status: DC

## 2018-06-02 MED ORDER — POTASSIUM CHLORIDE ER 10 MEQ PO TBCR: 10.0000 meq | EXTENDED_RELEASE_TABLET | Freq: Two times a day (BID) | ORAL | 0 refills | Status: DC

## 2018-06-02 NOTE — Telephone Encounter (Signed)
Medication sent in, tried to contact patient, no answer

## 2018-07-09 ENCOUNTER — Ambulatory Visit (INDEPENDENT_AMBULATORY_CARE_PROVIDER_SITE_OTHER): Payer: Medicare Other | Admitting: Family

## 2018-07-09 ENCOUNTER — Encounter: Payer: Self-pay | Admitting: Family

## 2018-07-09 VITALS — BP 133/86 | HR 95 | Temp 97.0°F | Ht 63.0 in | Wt 189.6 lb

## 2018-07-09 DIAGNOSIS — M109 Gout, unspecified: Secondary | ICD-10-CM | POA: Diagnosis not present

## 2018-07-09 MED ORDER — COLCHICINE 0.6 MG PO TABS: ORAL_TABLET | ORAL | 1 refills | Status: DC

## 2018-07-09 NOTE — Progress Notes (Signed)
   Subjective:    Patient ID: Stacey [name], female    DOB: [birth year], 77 y.o.   MRN: [id number]  Chief Complaint  Patient presents with  . pain in right great toe    HPI PT presents to the office today with right great toe redness and pain that started last week. She states she can not "stand the sheet to touch her foot at night".She has taken Ultram as needed that helps slightly with the pain.    Review of Systems  All other systems reviewed and are negative.      Objective:   Physical Exam  Constitutional: She is oriented to person, place, and time. She appears well-developed and well-nourished. No distress.  HENT:  Head: Normocephalic.  Eyes: Pupils are equal, round, and reactive to light.  Neck: Normal range of motion. Neck supple. No thyromegaly present.  Cardiovascular: Normal rate, regular rhythm, normal heart sounds and intact distal pulses.  No murmur heard. Pulmonary/Chest: Effort normal and breath sounds normal. No respiratory distress. She has no wheezes.  Abdominal: Soft. Bowel sounds are normal. She exhibits no distension. There is no tenderness.  Musculoskeletal: Normal range of motion. She exhibits tenderness (right great toe erythemas, warmth, and tender). She exhibits no edema.  Neurological: She is alert and oriented to person, place, and time. She has normal reflexes. No cranial nerve deficit.  Skin: Skin is warm and dry.  Psychiatric: She has a normal mood and affect. Her behavior is normal. Judgment and thought content normal.  Vitals reviewed.     BP (!) 130/93   Pulse 100   Temp (!) 97 F (36.1 C) (Oral)   Ht 5\' 3"  (1.6 m)   Wt 189 lb 9.6 oz (86 kg)   BMI 33.59 kg/m      Assessment & Plan:  Stacey [name] comes in today with chief complaint of pain in right great toe   Diagnosis and orders addressed:  1. Acute gout involving toe of right foot, unspecified cause Low purine diet Motrin as needed Keep follow up appt We may need to  stop HCTZ if gout occurs again - colchicine 0.6 MG tablet; Take 1.2 mg once then one hour later take 0.6 mg. Max dose 1.8 mg/24 hours  Dispense: 20 tablet; Refill: 1  Jannifer Rodney, FNP

## 2018-07-09 NOTE — Patient Instructions (Signed)

## 2018-07-10 ENCOUNTER — Telehealth: Payer: Self-pay | Admitting: Family

## 2018-07-10 MED ORDER — PREDNISONE 10 MG (21) PO TBPK: ORAL_TABLET | ORAL | 0 refills | Status: DC

## 2018-07-10 NOTE — Telephone Encounter (Signed)
She can stop the colchicine. I sent in rx for prednisone.

## 2018-07-10 NOTE — Telephone Encounter (Signed)
Patient aware.

## 2018-07-10 NOTE — Telephone Encounter (Signed)
Please advise 

## 2018-07-23 DIAGNOSIS — Z23 Encounter for immunization: Secondary | ICD-10-CM | POA: Diagnosis not present

## 2018-08-31 ENCOUNTER — Ambulatory Visit: Payer: Medicare Other | Admitting: Family

## 2018-09-07 ENCOUNTER — Encounter: Payer: Self-pay | Admitting: Family

## 2018-09-07 ENCOUNTER — Ambulatory Visit (INDEPENDENT_AMBULATORY_CARE_PROVIDER_SITE_OTHER): Payer: Medicare Other | Admitting: Family

## 2018-09-07 VITALS — BP 107/69 | HR 89 | Temp 96.7°F | Ht 63.0 in | Wt 188.8 lb

## 2018-09-07 DIAGNOSIS — K2100 Gastro-esophageal reflux disease with esophagitis, without bleeding: Secondary | ICD-10-CM

## 2018-09-07 DIAGNOSIS — M150 Primary generalized (osteo)arthritis: Secondary | ICD-10-CM | POA: Diagnosis not present

## 2018-09-07 DIAGNOSIS — I10 Essential (primary) hypertension: Secondary | ICD-10-CM

## 2018-09-07 DIAGNOSIS — M159 Polyosteoarthritis, unspecified: Secondary | ICD-10-CM

## 2018-09-07 DIAGNOSIS — E785 Hyperlipidemia, unspecified: Secondary | ICD-10-CM | POA: Diagnosis not present

## 2018-09-07 MED ORDER — HYDROCHLOROTHIAZIDE 25 MG PO TABS: 25.0000 mg | ORAL_TABLET | Freq: Every day | ORAL | 2 refills | Status: DC

## 2018-09-07 MED ORDER — MELOXICAM 7.5 MG PO TABS: 7.5000 mg | ORAL_TABLET | Freq: Every day | ORAL | 0 refills | Status: DC

## 2018-09-07 MED ORDER — POTASSIUM CHLORIDE ER 10 MEQ PO TBCR: 10.0000 meq | EXTENDED_RELEASE_TABLET | Freq: Two times a day (BID) | ORAL | 1 refills | Status: DC

## 2018-09-07 NOTE — Progress Notes (Signed)
Subjective:    Patient ID: Stacey [name], female    DOB: April 03, 1941, 77 y.o.   MRN: [id number]  Chief Complaint  Patient presents with  . Medical Management of Chronic Issues    six month recheck with refills   Pt presents to the office today for chronic follow up. She is doing well. She did have a gout flare up in 07/2018. After review of her medications, she is taking HCTZ 25 mg and Lasix as needed. She has compression hose  Hypertension  This is a chronic problem. The current episode started more than 1 year ago. The problem has been resolved since onset. The problem is controlled. Associated symptoms include peripheral edema (in right leg). Pertinent negatives include no headaches, malaise/fatigue or shortness of breath. Risk factors for coronary artery disease include obesity. The current treatment provides moderate improvement. There is no history of kidney disease, CAD/MI or heart failure.  Arthritis  Presents for follow-up visit. She complains of pain and stiffness. The symptoms have been worsening. Affected locations include the right knee and left knee (back). Her pain is at a severity of 9/10.  Gastroesophageal Reflux  She reports no belching, no coughing or no heartburn. This is a chronic problem. The current episode started more than 1 year ago. The problem occurs occasionally. The problem has been waxing and waning. The symptoms are aggravated by medications. Risk factors include obesity. She has tried a PPI for the symptoms. The treatment provided moderate relief.  Hyperlipidemia  This is a chronic problem. The current episode started more than 1 year ago. The problem is uncontrolled. Recent lipid tests were reviewed and are high. Exacerbating diseases include obesity. Pertinent negatives include no shortness of breath. Current antihyperlipidemic treatment includes herbal therapy. The current treatment provides mild improvement of lipids. Risk factors for coronary artery disease  include dyslipidemia, hypertension and a sedentary lifestyle.      Review of Systems  Constitutional: Negative for malaise/fatigue.  Respiratory: Negative for cough and shortness of breath.   Gastrointestinal: Negative for heartburn.  Musculoskeletal: Positive for arthritis and stiffness.  Neurological: Negative for headaches.  All other systems reviewed and are negative.      Objective:   Physical Exam  Constitutional: She is oriented to person, place, and time. She appears well-developed and well-nourished. No distress.  HENT:  Head: Normocephalic and atraumatic.  Right Ear: External ear normal.  Left Ear: External ear normal.  Mouth/Throat: Oropharynx is clear and moist.  Eyes: Pupils are equal, round, and reactive to light.  Neck: Normal range of motion. Neck supple. No thyromegaly present.  Cardiovascular: Normal rate, regular rhythm, normal heart sounds and intact distal pulses.  No murmur heard. Pulmonary/Chest: Effort normal and breath sounds normal. No respiratory distress. She has no wheezes.  Abdominal: Soft. Bowel sounds are normal. She exhibits no distension. There is no tenderness.  Musculoskeletal: She exhibits edema (2+ in right ankle). She exhibits no tenderness.  Pain with flexion in bilateral knees  Neurological: She is alert and oriented to person, place, and time. She has normal reflexes. No cranial nerve deficit.  Skin: Skin is warm and dry.  Psychiatric: She has a normal mood and affect. Her behavior is normal. Judgment and thought content normal.  Vitals reviewed.     BP 107/69   Pulse 89   Temp (!) 96.7 F (35.9 C) (Oral)   Ht '5\' 3"'  (1.6 m)   Wt 188 lb 12.8 oz (85.6 kg)   BMI 33.44 kg/m  Assessment & Plan:  Stacey [name] comes in today with chief complaint of Medical Management of Chronic Issues (six month recheck with refills)   Diagnosis and orders addressed:  1. Essential hypertension We will stop Lasix today -  hydrochlorothiazide (HYDRODIURIL) 25 MG tablet; Take 1 tablet (25 mg total) by mouth daily.  Dispense: 90 tablet; Refill: 2 - CMP14+EGFR - CBC with Differential/Platelet  2. Morbid obesity ([place]) - CMP14+EGFR - CBC with Differential/Platelet  3. Gastroesophageal reflux disease with esophagitis - CMP14+EGFR - CBC with Differential/Platelet  4. Primary osteoarthritis involving multiple joints We will start Mobic today and do referral to Ortho - CMP14+EGFR - CBC with Differential/Platelet - Ambulatory referral to Orthopedic Surgery - meloxicam (MOBIC) 7.5 MG tablet; Take 1 tablet (7.5 mg total) by mouth daily.  Dispense: 30 tablet; Refill: 0  5. Hyperlipidemia, unspecified hyperlipidemia type - CMP14+EGFR - CBC with Differential/Platelet   Labs pending Health Maintenance reviewed Diet and exercise encouraged  Follow up plan: 2-4 weeks to recheck HTN   Evelina Dun, FNP

## 2018-09-07 NOTE — Patient Instructions (Signed)

## 2018-09-08 LAB — CMP14+EGFR
ALT: 22 IU/L (ref 0–32)
AST: 22 IU/L (ref 0–40)
Albumin/Globulin Ratio: 1.5 (ref 1.2–2.2)
Albumin: 4.2 g/dL (ref 3.5–4.8)
Alkaline Phosphatase: 81 IU/L (ref 39–117)
BILIRUBIN TOTAL: 0.2 mg/dL (ref 0.0–1.2)
BUN/Creatinine Ratio: 28 (ref 12–28)
BUN: 22 mg/dL (ref 8–27)
CHLORIDE: 98 mmol/L (ref 96–106)
CO2: 27 mmol/L (ref 20–29)
Calcium: 9.5 mg/dL (ref 8.7–10.3)
Creatinine, Ser: 0.8 mg/dL (ref 0.57–1.00)
GFR calc non Af Amer: 72 mL/min/{1.73_m2} (ref >59)
GFR, EST AFRICAN AMERICAN: 83 mL/min/{1.73_m2} (ref >59)
GLOBULIN, TOTAL: 2.8 g/dL (ref 1.5–4.5)
Glucose: 106 mg/dL — ABNORMAL HIGH (ref 65–99)
Potassium: 3.9 mmol/L (ref 3.5–5.2)
Sodium: 143 mmol/L (ref 134–144)
TOTAL PROTEIN: 7 g/dL (ref 6.0–8.5)

## 2018-09-08 LAB — CBC WITH DIFFERENTIAL/PLATELET
BASOS ABS: 0.1 10*3/uL (ref 0.0–0.2)
Basos: 1 %
EOS (ABSOLUTE): 0.1 10*3/uL (ref 0.0–0.4)
Eos: 2 %
HEMOGLOBIN: 12.3 g/dL (ref 11.1–15.9)
Hematocrit: 36.9 % (ref 34.0–46.6)
IMMATURE GRANS (ABS): 0 10*3/uL (ref 0.0–0.1)
Immature Granulocytes: 0 %
LYMPHS: 36 %
Lymphocytes Absolute: 2.7 10*3/uL (ref 0.7–3.1)
MCH: 29.9 pg (ref 26.6–33.0)
MCHC: 33.3 g/dL (ref 31.5–35.7)
MCV: 90 fL (ref 79–97)
MONOCYTES: 6 %
Monocytes Absolute: 0.4 10*3/uL (ref 0.1–0.9)
Neutrophils Absolute: 4.1 10*3/uL (ref 1.4–7.0)
Neutrophils: 55 %
Platelets: 229 10*3/uL (ref 150–450)
RBC: 4.11 x10E6/uL (ref 3.77–5.28)
RDW: 13.1 % (ref 12.3–15.4)
WBC: 7.4 10*3/uL (ref 3.4–10.8)

## 2018-10-13 ENCOUNTER — Ambulatory Visit (INDEPENDENT_AMBULATORY_CARE_PROVIDER_SITE_OTHER): Payer: Medicare Other | Admitting: Family

## 2018-10-13 ENCOUNTER — Encounter: Payer: Self-pay | Admitting: Family

## 2018-10-13 VITALS — BP 129/74 | HR 83 | Temp 96.9°F | Ht 63.0 in | Wt 189.2 lb

## 2018-10-13 DIAGNOSIS — I10 Essential (primary) hypertension: Secondary | ICD-10-CM | POA: Diagnosis not present

## 2018-10-13 DIAGNOSIS — M150 Primary generalized (osteo)arthritis: Secondary | ICD-10-CM

## 2018-10-13 DIAGNOSIS — J0110 Acute frontal sinusitis, unspecified: Secondary | ICD-10-CM | POA: Diagnosis not present

## 2018-10-13 DIAGNOSIS — M159 Polyosteoarthritis, unspecified: Secondary | ICD-10-CM

## 2018-10-13 MED ORDER — MELOXICAM 7.5 MG PO TABS: 7.5000 mg | ORAL_TABLET | Freq: Every day | ORAL | 1 refills | Status: DC

## 2018-10-13 MED ORDER — AMOXICILLIN-POT CLAVULANATE 875-125 MG PO TABS: 1.0000 | ORAL_TABLET | Freq: Two times a day (BID) | ORAL | 0 refills | Status: DC

## 2018-10-13 NOTE — Progress Notes (Signed)
Subjective:    Patient ID: Stacey [name], female    DOB: 1940/11/17, 78 y.o.   MRN: [id number]  Chief Complaint  Patient presents with  . Hypertension    recheck    Hypertension  This is a chronic problem. The current episode started more than 1 year ago. The problem has been resolved since onset. The problem is controlled. Associated symptoms include malaise/fatigue and peripheral edema. Pertinent negatives include no headaches or shortness of breath. Risk factors for coronary artery disease include dyslipidemia and obesity. The current treatment provides moderate improvement.  Sinusitis  This is a new problem. The current episode started 1 to 4 weeks ago. The problem has been gradually worsening since onset. There has been no fever. Her pain is at a severity of 8/10. The pain is moderate. Associated symptoms include congestion, coughing, a hoarse voice and sinus pressure. Pertinent negatives include no headaches, shortness of breath or sore throat. Past treatments include oral decongestants. The treatment provided mild relief.  Arthritis  Presents for follow-up visit. She complains of pain and stiffness.      Review of Systems  Constitutional: Positive for malaise/fatigue.  HENT: Positive for congestion, hoarse voice and sinus pressure. Negative for sore throat.   Respiratory: Positive for cough. Negative for shortness of breath.   Musculoskeletal: Positive for arthritis and stiffness.  Neurological: Negative for headaches.  All other systems reviewed and are negative.      Objective:   Physical Exam Vitals signs reviewed.  Constitutional:      General: She is not in acute distress.    Appearance: She is well-developed.  HENT:     Right Ear: Tympanic membrane normal.     Left Ear: Tympanic membrane normal.     Nose:     Right Turbinates: Swollen.     Left Turbinates: Swollen.     Right Sinus: Maxillary sinus tenderness present.  Eyes:     Pupils: Pupils are equal,  round, and reactive to light.  Neck:     Musculoskeletal: Normal range of motion and neck supple.     Thyroid: No thyromegaly.  Cardiovascular:     Rate and Rhythm: Normal rate and regular rhythm.     Heart sounds: Normal heart sounds. No murmur.  Pulmonary:     Effort: Pulmonary effort is normal. No respiratory distress.     Breath sounds: Normal breath sounds. No wheezing.  Abdominal:     General: Bowel sounds are normal. There is no distension.     Palpations: Abdomen is soft.     Tenderness: There is no abdominal tenderness.  Musculoskeletal: Normal range of motion.        General: No tenderness.     Right lower leg: Edema (2+) present.  Skin:    General: Skin is warm and dry.  Neurological:     Mental Status: She is alert and oriented to person, place, and time.     Cranial Nerves: No cranial nerve deficit.     Deep Tendon Reflexes: Reflexes are normal and symmetric.  Psychiatric:        Behavior: Behavior normal.        Thought Content: Thought content normal.        Judgment: Judgment normal.        BP 129/74   Pulse 83   Temp (!) 96.9 F (36.1 C) (Oral)   Ht 5' 3" (1.6 m)   Wt 189 lb 3.2 oz (85.8 kg)   BMI 33.52  kg/m    Assessment & Plan:  Stacey [name] comes in today with chief complaint of Hypertension (recheck)   Diagnosis and orders addressed:  1. Acute frontal sinusitis, recurrence not specified - Take meds as prescribed - Use a cool mist humidifier  -Use saline nose sprays frequently -Force fluids -For any cough or congestion  Use plain Mucinex- regular strength or max strength is fine -For fever or aces or pains- take tylenol or ibuprofen. -Throat lozenges if help -RTO if symptoms worsen or do not improve - amoxicillin-clavulanate (AUGMENTIN) 875-125 MG tablet; Take 1 tablet by mouth 2 (two) times daily.  Dispense: 14 tablet; Refill: 0 - BMP8+EGFR  2. Essential hypertension At goal -Daily blood pressure log given with instructions on how  to fill out and told to bring to next visit -Dash diet information given -Exercise encouraged - Stress Management  -Continue current meds - BMP8+EGFR  3. Primary osteoarthritis involving multiple joints Refilled medication at visit - meloxicam (MOBIC) 7.5 MG tablet; Take 1 tablet (7.5 mg total) by mouth daily.  Dispense: 90 tablet; Refill: 1   Christy Hawks, FNP  

## 2018-10-13 NOTE — Patient Instructions (Signed)
Sinusitis, Adult  Sinusitis is inflammation of your sinuses. Sinuses are hollow spaces in the bones around your face. Your sinuses are located:   Around your eyes.   In the middle of your forehead.   Behind your nose.   In your cheekbones.  Mucus normally drains out of your sinuses. When your nasal tissues become inflamed or swollen, mucus can become trapped or blocked. This allows bacteria, viruses, and fungi to grow, which leads to infection. Most infections of the sinuses are caused by a virus.  Sinusitis can develop quickly. It can last for up to 4 weeks (acute) or for more than 12 weeks (chronic). Sinusitis often develops after a cold.  What are the causes?  This condition is caused by anything that creates swelling in the sinuses or stops mucus from draining. This includes:   Allergies.   Asthma.   Infection from bacteria or viruses.   Deformities or blockages in your nose or sinuses.   Abnormal growths in the nose (nasal polyps).   Pollutants, such as chemicals or irritants in the air.   Infection from fungi (rare).  What increases the risk?  You are more likely to develop this condition if you:   Have a weak body defense system (immune system).   Do a lot of swimming or diving.   Overuse nasal sprays.   Smoke.  What are the signs or symptoms?  The main symptoms of this condition are pain and a feeling of pressure around the affected sinuses. Other symptoms include:   Stuffy nose or congestion.   Thick drainage from your nose.   Swelling and warmth over the affected sinuses.   Headache.   Upper toothache.   A cough that may get worse at night.   Extra mucus that collects in the throat or the back of the nose (postnasal drip).   Decreased sense of smell and taste.   Fatigue.   A fever.   Sore throat.   Bad breath.  How is this diagnosed?  This condition is diagnosed based on:   Your symptoms.   Your medical history.   A physical exam.   Tests to find out if your condition is  acute or chronic. This may include:  ? Checking your nose for nasal polyps.  ? Viewing your sinuses using a device that has a light (endoscope).  ? Testing for allergies or bacteria.  ? Imaging tests, such as an MRI or CT scan.  In rare cases, a bone biopsy may be done to rule out more serious types of fungal sinus disease.  How is this treated?  Treatment for sinusitis depends on the cause and whether your condition is chronic or acute.   If caused by a virus, your symptoms should go away on their own within 10 days. You may be given medicines to relieve symptoms. They include:  ? Medicines that shrink swollen nasal passages (topical intranasal decongestants).  ? Medicines that treat allergies (antihistamines).  ? A spray that eases inflammation of the nostrils (topical intranasal corticosteroids).  ? Rinses that help get rid of thick mucus in your nose (nasal saline washes).   If caused by bacteria, your health care provider may recommend waiting to see if your symptoms improve. Most bacterial infections will get better without antibiotic medicine. You may be given antibiotics if you have:  ? A severe infection.  ? A weak immune system.   If caused by narrow nasal passages or nasal polyps, you may need   to have surgery.  Follow these instructions at home:  Medicines   Take, use, or apply over-the-counter and prescription medicines only as told by your health care provider. These may include nasal sprays.   If you were prescribed an antibiotic medicine, take it as told by your health care provider. Do not stop taking the antibiotic even if you start to feel better.  Hydrate and humidify     Drink enough fluid to keep your urine pale yellow. Staying hydrated will help to thin your mucus.   Use a cool mist humidifier to keep the humidity level in your home above 50%.   Inhale steam for 10-15 minutes, 3-4 times a day, or as told by your health care provider. You can do this in the bathroom while a hot shower is  running.   Limit your exposure to cool or dry air.  Rest   Rest as much as possible.   Sleep with your head raised (elevated).   Make sure you get enough sleep each night.  General instructions     Apply a warm, moist washcloth to your face 3-4 times a day or as told by your health care provider. This will help with discomfort.   Wash your hands often with soap and water to reduce your exposure to germs. If soap and water are not available, use hand sanitizer.   Do not smoke. Avoid being around people who are smoking (secondhand smoke).   Keep all follow-up visits as told by your health care provider. This is important.  Contact a health care provider if:   You have a fever.   Your symptoms get worse.   Your symptoms do not improve within 10 days.  Get help right away if:   You have a severe headache.   You have persistent vomiting.   You have severe pain or swelling around your face or eyes.   You have vision problems.   You develop confusion.   Your neck is stiff.   You have trouble breathing.  Summary   Sinusitis is soreness and inflammation of your sinuses. Sinuses are hollow spaces in the bones around your face.   This condition is caused by nasal tissues that become inflamed or swollen. The swelling traps or blocks the flow of mucus. This allows bacteria, viruses, and fungi to grow, which leads to infection.   If you were prescribed an antibiotic medicine, take it as told by your health care provider. Do not stop taking the antibiotic even if you start to feel better.   Keep all follow-up visits as told by your health care provider. This is important.  This information is not intended to replace advice given to you by your health care provider. Make sure you discuss any questions you have with your health care provider.  Document Released: 09/23/2005 Document Revised: 02/23/2018 Document Reviewed: 02/23/2018  Elsevier Interactive Patient Education  2019 [employer]

## 2018-10-14 LAB — BMP8+EGFR
BUN/Creatinine Ratio: 25 (ref 12–28)
BUN: 20 mg/dL (ref 8–27)
CALCIUM: 9.9 mg/dL (ref 8.7–10.3)
CO2: 26 mmol/L (ref 20–29)
CREATININE: 0.79 mg/dL (ref 0.57–1.00)
Chloride: 97 mmol/L (ref 96–106)
GFR calc Af Amer: 84 mL/min/{1.73_m2} (ref >59)
GFR, EST NON AFRICAN AMERICAN: 73 mL/min/{1.73_m2} (ref >59)
Glucose: 104 mg/dL — ABNORMAL HIGH (ref 65–99)
POTASSIUM: 4.1 mmol/L (ref 3.5–5.2)
Sodium: 138 mmol/L (ref 134–144)

## 2018-10-15 ENCOUNTER — Telehealth: Payer: Self-pay | Admitting: Family

## 2018-10-15 MED ORDER — FLUCONAZOLE 150 MG PO TABS: 150.0000 mg | ORAL_TABLET | ORAL | 0 refills | Status: DC | PRN

## 2018-10-15 NOTE — Telephone Encounter (Signed)
Diflucan Prescription sent to pharmacy   

## 2018-10-15 NOTE — Telephone Encounter (Signed)
What symptoms do you have? Burning when she wets. Was put on ABX and has yeast infection  How long have you been sick? Today  Have you been seen for this problem? No  If your provider decides to give you a prescription, which pharmacy [facility]   Patient informed that this information will be sent to the clinical staff for review and that they should receive a follow up call.

## 2018-10-16 NOTE — Telephone Encounter (Signed)
Aware. 

## 2019-03-08 ENCOUNTER — Other Ambulatory Visit: Payer: Self-pay

## 2019-03-09 ENCOUNTER — Ambulatory Visit (INDEPENDENT_AMBULATORY_CARE_PROVIDER_SITE_OTHER): Payer: Medicare Other | Admitting: Family

## 2019-03-09 ENCOUNTER — Encounter: Payer: Self-pay | Admitting: Family

## 2019-03-09 DIAGNOSIS — E8881 Metabolic syndrome: Secondary | ICD-10-CM | POA: Diagnosis not present

## 2019-03-09 DIAGNOSIS — K2100 Gastro-esophageal reflux disease with esophagitis, without bleeding: Secondary | ICD-10-CM

## 2019-03-09 DIAGNOSIS — M150 Primary generalized (osteo)arthritis: Secondary | ICD-10-CM | POA: Diagnosis not present

## 2019-03-09 DIAGNOSIS — I10 Essential (primary) hypertension: Secondary | ICD-10-CM

## 2019-03-09 DIAGNOSIS — M159 Polyosteoarthritis, unspecified: Secondary | ICD-10-CM

## 2019-03-09 DIAGNOSIS — E785 Hyperlipidemia, unspecified: Secondary | ICD-10-CM

## 2019-03-09 MED ORDER — MELOXICAM 7.5 MG PO TABS: 7.5000 mg | ORAL_TABLET | Freq: Every day | ORAL | 1 refills | Status: DC

## 2019-03-09 MED ORDER — POTASSIUM CHLORIDE ER 10 MEQ PO TBCR: 10.0000 meq | EXTENDED_RELEASE_TABLET | Freq: Two times a day (BID) | ORAL | 1 refills | Status: DC

## 2019-03-09 MED ORDER — HYDROCHLOROTHIAZIDE 25 MG PO TABS: 25.0000 mg | ORAL_TABLET | Freq: Every day | ORAL | 2 refills | Status: DC

## 2019-03-09 MED ORDER — LORATADINE 10 MG PO TABS: 10.0000 mg | ORAL_TABLET | Freq: Every day | ORAL | 11 refills | Status: DC

## 2019-03-09 NOTE — Progress Notes (Signed)
Virtual Visit via telephone Note  I connected with Stacey [name] on 03/09/19 at 11:19 AM by telephone and verified that I am speaking with the correct person using two identifiers. Stacey [name] is currently located at home and no one is currently with her during visit. The provider, Jannifer Rodney, FNP is located in their office at time of visit.  I discussed the limitations, risks, security and privacy concerns of performing an evaluation and management service by telephone and the availability of in person appointments. I also discussed with the patient that there may be a patient responsible charge related to this service. The patient expressed understanding and agreed to proceed.   History and Present Illness:  Pt presents to the office today for chronic follow up.  Hypertension  This is a chronic problem. The current episode started more than 1 year ago. The problem has been resolved since onset. The problem is controlled. Associated symptoms include peripheral edema. Pertinent negatives include no headaches, malaise/fatigue or shortness of breath. Risk factors for coronary artery disease include dyslipidemia, obesity and sedentary lifestyle. The current treatment provides moderate improvement. There is no history of kidney disease or CAD/MI.  Hyperlipidemia  This is a chronic problem. The current episode started more than 1 year ago. The problem is uncontrolled. Recent lipid tests were reviewed and are high. Exacerbating diseases include obesity. Pertinent negatives include no shortness of breath. Current antihyperlipidemic treatment includes herbal therapy. The current treatment provides mild improvement of lipids. Risk factors for coronary artery disease include dyslipidemia, a sedentary lifestyle and post-menopausal.  Gastroesophageal Reflux  She reports no belching, no coughing or no heartburn. This is a chronic problem. The current episode started more than 1 year ago. The problem  occurs occasionally. She has tried a diet change and an antacid for the symptoms. The treatment provided moderate relief.  Arthritis  Presents for follow-up visit. She complains of pain and stiffness. The symptoms have been stable. Affected locations include the right knee, left knee, right MCP and left MCP (back).  Metabolic Syndrome Pt is not active. She states she tries to eat healthy, but has gained some weight related to COVID because she has not been leaving her house.     Review of Systems  Constitutional: Negative for malaise/fatigue.  Respiratory: Negative for cough and shortness of breath.   Gastrointestinal: Negative for heartburn.  Musculoskeletal: Positive for arthritis and stiffness.  Neurological: Negative for headaches.  All other systems reviewed and are negative.    Observations/Objective: 124/73  Assessment and Plan: SAVY SERATT comes in today with chief complaint of No chief complaint on file.   Diagnosis and orders addressed:  1. Gastroesophageal reflux disease with esophagitis  2. Essential hypertension - hydrochlorothiazide (HYDRODIURIL) 25 MG tablet; Take 1 tablet (25 mg total) by mouth daily.  Dispense: 90 tablet; Refill: 2  3. Hyperlipidemia, unspecified hyperlipidemia type  4. Morbid obesity (HCC)  5. Primary osteoarthritis involving multiple joints - meloxicam (MOBIC) 7.5 MG tablet; Take 1 tablet (7.5 mg total) by mouth daily.  Dispense: 90 tablet; Refill: 1  6. Metabolic syndrome   Labs reivewed Health Maintenance reviewed Diet and exercise encouraged  Follow up plan: 3 months      I discussed the assessment and treatment plan with the patient. The patient was provided an opportunity to ask questions and all were answered. The patient agreed with the plan and demonstrated an understanding of the instructions.   The patient was advised to call back  or seek an in-person evaluation if the symptoms worsen or if the condition fails to  improve as anticipated.  The above assessment and management plan was discussed with the patient. The patient verbalized understanding of and has agreed to the management plan. Patient is aware to call the clinic if symptoms persist or worsen. Patient is aware when to return to the clinic for a follow-up visit. Patient educated on when it is appropriate to go to the emergency department.   Time call ended:11:35 AM    I provided 16 minutes of non-face-to-face time during this encounter.    Jannifer Rodneyhristy Kalani Baray, FNP

## 2019-03-23 ENCOUNTER — Ambulatory Visit: Payer: Medicare Other

## 2019-04-23 ENCOUNTER — Other Ambulatory Visit: Payer: Self-pay

## 2019-04-23 ENCOUNTER — Encounter: Payer: Self-pay | Admitting: Family

## 2019-04-23 ENCOUNTER — Ambulatory Visit (INDEPENDENT_AMBULATORY_CARE_PROVIDER_SITE_OTHER): Payer: Medicare Other | Admitting: Family

## 2019-04-23 DIAGNOSIS — J019 Acute sinusitis, unspecified: Secondary | ICD-10-CM | POA: Diagnosis not present

## 2019-04-23 MED ORDER — FLUCONAZOLE 150 MG PO TABS: 150.0000 mg | ORAL_TABLET | ORAL | 0 refills | Status: DC | PRN

## 2019-04-23 MED ORDER — AMOXICILLIN-POT CLAVULANATE 875-125 MG PO TABS: 1.0000 | ORAL_TABLET | Freq: Two times a day (BID) | ORAL | 0 refills | Status: DC

## 2019-04-23 MED ORDER — FEXOFENADINE HCL 180 MG PO TABS: 180.0000 mg | ORAL_TABLET | Freq: Every day | ORAL | 1 refills | Status: DC

## 2019-04-23 NOTE — Progress Notes (Signed)
   Virtual Visit via telephone Note  I connected with [name] on 04/23/19 at 9:30 AM by telephone and verified that I am speaking with the correct person using two identifiers. Stacey [name] is currently located at home and no one is currently with her during visit. The provider, Evelina Dun, FNP is located in their office at time of visit.  I discussed the limitations, risks, security and privacy concerns of performing an evaluation and management service by telephone and the availability of in person appointments. I also discussed with the patient that there may be a patient responsible charge related to this service. The patient expressed understanding and agreed to proceed.   History and Present Illness:  Sinusitis This is a recurrent problem. The current episode started 1 to 4 weeks ago. The problem has been gradually worsening since onset. There has been no fever. Her pain is at a severity of 8/10. The pain is moderate. Associated symptoms include chills ("once in a while"), congestion, headaches, a hoarse voice, sinus pressure and sneezing. Pertinent negatives include no ear pain or sore throat. Past treatments include spray decongestants (Claritin). The treatment provided mild relief.      Review of Systems  Constitutional: Positive for chills ("once in a while").  HENT: Positive for congestion, hoarse voice, sinus pressure and sneezing. Negative for ear pain and sore throat.   Neurological: Positive for headaches.  All other systems reviewed and are negative.    Observations/Objective: No SOB or distress noted  Assessment and Plan: 1. Acute sinusitis, recurrence not specified, unspecified location - Take meds as prescribed - Use a cool mist humidifier  -Use saline nose sprays frequently -Force fluids -For any cough or congestion  Use plain Mucinex- regular strength or max strength is fine -For fever or aces or pains- take tylenol or ibuprofen. -Throat  lozenges if help -RTO if symptoms worsen or do not improve - amoxicillin-clavulanate (AUGMENTIN) 875-125 MG tablet; Take 1 tablet by mouth 2 (two) times daily.  Dispense: 14 tablet; Refill: 0 - fluconazole (DIFLUCAN) 150 MG tablet; Take 1 tablet (150 mg total) by mouth every three (3) days as needed.  Dispense: 3 tablet; Refill: 0 - fexofenadine (ALLEGRA ALLERGY) 180 MG tablet; Take 1 tablet (180 mg total) by mouth daily.  Dispense: 90 tablet; Refill: 1     I discussed the assessment and treatment plan with the patient. The patient was provided an opportunity to ask questions and all were answered. The patient agreed with the plan and demonstrated an understanding of the instructions.   The patient was advised to call back or seek an in-person evaluation if the symptoms worsen or if the condition fails to improve as anticipated.  The above assessment and management plan was discussed with the patient. The patient verbalized understanding of and has agreed to the management plan. Patient is aware to call the clinic if symptoms persist or worsen. Patient is aware when to return to the clinic for a follow-up visit. Patient educated on when it is appropriate to go to the emergency department.   Time call ended:  9:36 AM   I provided 6 minutes of non-face-to-face time during this encounter.    Evelina Dun, FNP

## 2019-08-09 DIAGNOSIS — Z23 Encounter for immunization: Secondary | ICD-10-CM | POA: Diagnosis not present

## 2019-08-30 ENCOUNTER — Other Ambulatory Visit: Payer: Self-pay | Admitting: Family

## 2019-09-15 ENCOUNTER — Other Ambulatory Visit: Payer: Self-pay

## 2019-09-16 ENCOUNTER — Ambulatory Visit (INDEPENDENT_AMBULATORY_CARE_PROVIDER_SITE_OTHER): Payer: Medicare Other | Admitting: Family

## 2019-09-16 ENCOUNTER — Encounter: Payer: Self-pay | Admitting: Family

## 2019-09-16 VITALS — BP 122/71 | HR 105 | Temp 97.8°F | Ht 63.0 in | Wt 191.0 lb

## 2019-09-16 DIAGNOSIS — M159 Polyosteoarthritis, unspecified: Secondary | ICD-10-CM

## 2019-09-16 DIAGNOSIS — I10 Essential (primary) hypertension: Secondary | ICD-10-CM

## 2019-09-16 DIAGNOSIS — E876 Hypokalemia: Secondary | ICD-10-CM

## 2019-09-16 DIAGNOSIS — M5441 Lumbago with sciatica, right side: Secondary | ICD-10-CM

## 2019-09-16 DIAGNOSIS — E785 Hyperlipidemia, unspecified: Secondary | ICD-10-CM | POA: Diagnosis not present

## 2019-09-16 DIAGNOSIS — R002 Palpitations: Secondary | ICD-10-CM

## 2019-09-16 DIAGNOSIS — G8929 Other chronic pain: Secondary | ICD-10-CM

## 2019-09-16 DIAGNOSIS — M8949 Other hypertrophic osteoarthropathy, multiple sites: Secondary | ICD-10-CM | POA: Diagnosis not present

## 2019-09-16 DIAGNOSIS — F411 Generalized anxiety disorder: Secondary | ICD-10-CM

## 2019-09-16 MED ORDER — BUSPIRONE HCL 5 MG PO TABS: 5.0000 mg | ORAL_TABLET | Freq: Three times a day (TID) | ORAL | 2 refills | Status: DC | PRN

## 2019-09-16 NOTE — Patient Instructions (Signed)

## 2019-09-16 NOTE — Progress Notes (Signed)
Subjective:    Patient ID: Stacey [name], female    DOB: Dec 21, 1940, 78 y.o.   MRN: [id number]  Chief Complaint  Patient presents with  . Anxiety   Pt presents to the office today for chronic follow up. Pt states she has not been able to sleep at night. She states she is very nervous and is crying. She states she can not "relax". She has tried deep breathing with no relief.  Anxiety Presents for follow-up visit. Symptoms include decreased concentration, excessive worry, insomnia, irritability, nervous/anxious behavior, panic and restlessness. Patient reports no shortness of breath. Symptoms occur most days. The severity of symptoms is moderate.    Hypertension This is a chronic problem. The current episode started more than 1 year ago. The problem has been resolved since onset. The problem is controlled. Associated symptoms include anxiety, malaise/fatigue and peripheral edema. Pertinent negatives include no shortness of breath. Risk factors for coronary artery disease include dyslipidemia, obesity and sedentary lifestyle. The current treatment provides moderate improvement.  Hyperlipidemia This is a chronic problem. The current episode started more than 1 year ago. The problem is controlled. Recent lipid tests were reviewed and are normal. Exacerbating diseases include obesity. Pertinent negatives include no shortness of breath. Current antihyperlipidemic treatment includes diet change. The current treatment provides mild improvement of lipids.  Arthritis Presents for follow-up visit. She complains of pain. The symptoms have been stable. Affected locations include the right knee and left knee (back). Her pain is at a severity of 10/10.      Review of Systems  Constitutional: Positive for irritability and malaise/fatigue.  Respiratory: Negative for shortness of breath.   Musculoskeletal: Positive for arthritis.  Psychiatric/Behavioral: Positive for decreased concentration. The patient  is nervous/anxious and has insomnia.   All other systems reviewed and are negative.      Objective:   Physical Exam Vitals reviewed.  Constitutional:      General: She is not in acute distress.    Appearance: She is well-developed.  HENT:     Head: Normocephalic and atraumatic.     Right Ear: Tympanic membrane normal.     Left Ear: Tympanic membrane normal.  Eyes:     Pupils: Pupils are equal, round, and reactive to light.  Neck:     Thyroid: No thyromegaly.  Cardiovascular:     Rate and Rhythm: Normal rate. Rhythm irregular.     Heart sounds: Normal heart sounds. No murmur.  Pulmonary:     Effort: Pulmonary effort is normal. No respiratory distress.     Breath sounds: Normal breath sounds. No wheezing.  Abdominal:     General: Bowel sounds are normal. There is no distension.     Palpations: Abdomen is soft.     Tenderness: There is no abdominal tenderness.  Musculoskeletal:        General: No tenderness. Normal range of motion.     Cervical back: Normal range of motion and neck supple.     Right lower leg: Edema (2+) present.     Left lower leg: Edema (2+) present.  Skin:    General: Skin is warm and dry.  Neurological:     Mental Status: She is alert and oriented to person, place, and time.     Cranial Nerves: No cranial nerve deficit.     Deep Tendon Reflexes: Reflexes are normal and symmetric.  Psychiatric:        Behavior: Behavior normal.        Thought Content:  Thought content normal.        Judgment: Judgment normal.     BP 122/71   Pulse (!) 105   Temp 97.8 F (36.6 C) (Temporal)   Ht 5\' 3"  (1.6 m)   Wt 191 lb (86.6 kg)   SpO2 98%   BMI 33.83 kg/m      Assessment & Plan:  DEIRDRE GRYDER comes in today with chief complaint of Anxiety   Diagnosis and orders addressed:  1. Essential hypertension  2. Primary osteoarthritis involving multiple joints  3. Morbid obesity ([facility])  4. Hyperlipidemia, unspecified hyperlipidemia type  5. Chronic  bilateral low back pain with right-sided sciatica  6. Hypokalemia  7. GAD (generalized anxiety disorder) Will Buspar 5 mg TID prn today Stress management  - busPIRone (BUSPAR) 5 MG tablet; Take 1 tablet (5 mg total) by mouth 3 (three) times daily as needed.  Dispense: 90 tablet; Refill: 2 - EKG 12-Lead  8. Palpitations - EKG 12-Lead   Labs pending Health Maintenance reviewed Diet and exercise encouraged  Follow up plan: 1 month    Evelina Dun, FNP

## 2019-09-17 LAB — CBC WITH DIFFERENTIAL/PLATELET
Basophils Absolute: 0.1 10*3/uL (ref 0.0–0.2)
Basos: 1 %
EOS (ABSOLUTE): 0 10*3/uL (ref 0.0–0.4)
Eos: 1 %
Hematocrit: 39.4 % (ref 34.0–46.6)
Hemoglobin: 12.9 g/dL (ref 11.1–15.9)
Immature Grans (Abs): 0 10*3/uL (ref 0.0–0.1)
Immature Granulocytes: 0 %
Lymphocytes Absolute: 2.4 10*3/uL (ref 0.7–3.1)
Lymphs: 31 %
MCH: 29.7 pg (ref 26.6–33.0)
MCHC: 32.7 g/dL (ref 31.5–35.7)
MCV: 91 fL (ref 79–97)
Monocytes Absolute: 0.4 10*3/uL (ref 0.1–0.9)
Monocytes: 6 %
Neutrophils Absolute: 4.6 10*3/uL (ref 1.4–7.0)
Neutrophils: 61 %
Platelets: 225 10*3/uL (ref 150–450)
RBC: 4.34 x10E6/uL (ref 3.77–5.28)
RDW: 12.6 % (ref 11.7–15.4)
WBC: 7.5 10*3/uL (ref 3.4–10.8)

## 2019-09-17 LAB — LIPID PANEL
Chol/HDL Ratio: 3.8 ratio (ref 0.0–4.4)
Cholesterol, Total: 233 mg/dL — ABNORMAL HIGH (ref 100–199)
HDL: 62 mg/dL (ref >39)
LDL Chol Calc (NIH): 148 mg/dL — ABNORMAL HIGH (ref 0–99)
Triglycerides: 131 mg/dL (ref 0–149)
VLDL Cholesterol Cal: 23 mg/dL (ref 5–40)

## 2019-09-17 LAB — CMP14+EGFR
ALT: 16 IU/L (ref 0–32)
AST: 21 IU/L (ref 0–40)
Albumin/Globulin Ratio: 1.6 (ref 1.2–2.2)
Albumin: 4.4 g/dL (ref 3.7–4.7)
Alkaline Phosphatase: 72 IU/L (ref 39–117)
BUN/Creatinine Ratio: 17 (ref 12–28)
BUN: 17 mg/dL (ref 8–27)
Bilirubin Total: 0.2 mg/dL (ref 0.0–1.2)
CO2: 23 mmol/L (ref 20–29)
Calcium: 10.1 mg/dL (ref 8.7–10.3)
Chloride: 98 mmol/L (ref 96–106)
Creatinine, Ser: 0.99 mg/dL (ref 0.57–1.00)
GFR calc Af Amer: 64 mL/min/{1.73_m2} (ref >59)
GFR calc non Af Amer: 55 mL/min/{1.73_m2} — ABNORMAL LOW (ref >59)
Globulin, Total: 2.8 g/dL (ref 1.5–4.5)
Glucose: 121 mg/dL — ABNORMAL HIGH (ref 65–99)
Potassium: 4.1 mmol/L (ref 3.5–5.2)
Sodium: 140 mmol/L (ref 134–144)
Total Protein: 7.2 g/dL (ref 6.0–8.5)

## 2019-09-17 LAB — TSH: TSH: 1.65 u[IU]/mL (ref 0.450–4.500)

## 2019-09-24 ENCOUNTER — Other Ambulatory Visit: Payer: Self-pay | Admitting: Family

## 2019-09-24 DIAGNOSIS — E119 Type 2 diabetes mellitus without complications: Secondary | ICD-10-CM

## 2019-09-24 DIAGNOSIS — R7303 Prediabetes: Secondary | ICD-10-CM | POA: Insufficient documentation

## 2019-09-24 LAB — HGB A1C W/O EAG: Hgb A1c MFr Bld: 6 % — ABNORMAL HIGH (ref 4.8–5.6)

## 2019-09-24 LAB — SPECIMEN STATUS REPORT

## 2019-09-28 ENCOUNTER — Encounter: Payer: Self-pay | Admitting: Family

## 2019-09-28 ENCOUNTER — Ambulatory Visit (INDEPENDENT_AMBULATORY_CARE_PROVIDER_SITE_OTHER): Payer: Medicare Other | Admitting: Family

## 2019-09-28 DIAGNOSIS — F411 Generalized anxiety disorder: Secondary | ICD-10-CM | POA: Diagnosis not present

## 2019-09-28 NOTE — Progress Notes (Signed)
   Virtual Visit via telephone Note Due to COVID-19 pandemic this visit was conducted virtually. This visit type was conducted due to national recommendations for restrictions regarding the COVID-19 Pandemic (e.g. social distancing, sheltering in place) in an effort to limit this patient's exposure and mitigate transmission in our community. All issues noted in this document were discussed and addressed.  A physical exam was not performed with this format.  I connected with Stacey [name] on 09/28/19 at 10:20 AM by telephone and verified that I am speaking with the correct person using two identifiers. Stacey [name] is currently located at home  and no one  is currently with her during visit. The provider, Evelina Dun, FNP is located in their office at time of visit.  I discussed the limitations, risks, security and privacy concerns of performing an evaluation and management service by telephone and the availability of in person appointments. I also discussed with the patient that there may be a patient responsible charge related to this service. The patient expressed understanding and agreed to proceed.   History and Present Illness:  Pt calls the office today to follow up on GAD. She was started on Buspar 5 mg TID prn. She states she is doing much better. She is trying to get out of the house and just drive around in her car which is helping her. She does feel like she isn't sleeping as well as she should.  Anxiety Presents for follow-up visit. Symptoms include decreased concentration, excessive worry, insomnia, irritability and nervous/anxious behavior. Symptoms occur occasionally. The severity of symptoms is moderate. The quality of sleep is good.         Review of Systems  Constitutional: Positive for irritability.  Psychiatric/Behavioral: Positive for decreased concentration. The patient is nervous/anxious and has insomnia.      Observations/Objective: No SOB or distress  noted, seems upbeat and happy  Assessment and Plan: 1. GAD (generalized anxiety disorder) Continue Buspar Stress management  May increase to 10 mg at bed time if insomnia continues Keep chronic follow up      I discussed the assessment and treatment plan with the patient. The patient was provided an opportunity to ask questions and all were answered. The patient agreed with the plan and demonstrated an understanding of the instructions.   The patient was advised to call back or seek an in-person evaluation if the symptoms worsen or if the condition fails to improve as anticipated.  The above assessment and management plan was discussed with the patient. The patient verbalized understanding of and has agreed to the management plan. Patient is aware to call the clinic if symptoms persist or worsen. Patient is aware when to return to the clinic for a follow-up visit. Patient educated on when it is appropriate to go to the emergency department.   Time call ended:  10:35 AM  I provided 15 minutes of non-face-to-face time during this encounter.    Evelina Dun, FNP

## 2019-10-12 ENCOUNTER — Telehealth: Payer: Self-pay | Admitting: Family

## 2019-10-12 NOTE — Telephone Encounter (Signed)
Patient aware and verbalized understanding. °

## 2019-10-12 NOTE — Telephone Encounter (Signed)
Spoke to pt and she is taking Buspar 5 mg in the morning and 10mg  qhs. Pt states it is helping a little during the day with her anxiety but not helping with her insomnia.

## 2019-10-12 NOTE — Telephone Encounter (Signed)
She can increase her night time dose of Buspar to 15 mg and start Melatonin daily.

## 2019-10-13 ENCOUNTER — Other Ambulatory Visit: Payer: Self-pay | Admitting: Family

## 2019-11-02 ENCOUNTER — Ambulatory Visit (INDEPENDENT_AMBULATORY_CARE_PROVIDER_SITE_OTHER): Payer: Medicare Other | Admitting: Family

## 2019-11-02 ENCOUNTER — Encounter: Payer: Self-pay | Admitting: Family

## 2019-11-02 DIAGNOSIS — J019 Acute sinusitis, unspecified: Secondary | ICD-10-CM

## 2019-11-02 MED ORDER — FLUCONAZOLE 150 MG PO TABS: 150.0000 mg | ORAL_TABLET | ORAL | 0 refills | Status: DC | PRN

## 2019-11-02 MED ORDER — AMOXICILLIN-POT CLAVULANATE 875-125 MG PO TABS: 1.0000 | ORAL_TABLET | Freq: Two times a day (BID) | ORAL | 0 refills | Status: AC

## 2019-11-02 NOTE — Progress Notes (Signed)
   Virtual Visit via telephone Note Due to COVID-19 pandemic this visit was conducted virtually. This visit type was conducted due to national recommendations for restrictions regarding the COVID-19 Pandemic (e.g. social distancing, sheltering in place) in an effort to limit this patient's exposure and mitigate transmission in our community. All issues noted in this document were discussed and addressed.  A physical exam was not performed with this format.  I connected with Stacey [name] on 11/02/19 at 11:35 am by telephone and verified that I am speaking with the correct person using two identifiers. Stacey [name] is currently located at home and no one is currently with her during visit. The provider, Jannifer Rodney, FNP is located in their office at time of visit.  I discussed the limitations, risks, security and privacy concerns of performing an evaluation and management service by telephone and the availability of in person appointments. I also discussed with the patient that there may be a patient responsible charge related to this service. The patient expressed understanding and agreed to proceed.   History and Present Illness:  Sinusitis This is a new problem. The current episode started 1 to 4 weeks ago. The problem has been gradually worsening since onset. There has been no fever. Her pain is at a severity of 10/10. The pain is moderate. Associated symptoms include congestion, coughing, headaches, sinus pressure and sneezing. Pertinent negatives include no chills, ear pain, shortness of breath or sore throat. (Teeth pain ) Past treatments include oral decongestants. The treatment provided mild relief.      Review of Systems  Constitutional: Negative for chills.  HENT: Positive for congestion, sinus pressure and sneezing. Negative for ear pain and sore throat.   Respiratory: Positive for cough. Negative for shortness of breath.   Neurological: Positive for headaches.  All other  systems reviewed and are negative.    Observations/Objective: No SOB or distress noted, hoarse voice  Assessment and Plan: 1. Acute sinusitis, recurrence not specified, unspecified location - Take meds as prescribed - Use a cool mist humidifier  -Use saline nose sprays frequently -Force fluids -For any cough or congestion  Use plain Mucinex- regular strength or max strength is fine -For fever or aces or pains- take tylenol or ibuprofen. -Throat lozenges if help - amoxicillin-clavulanate (AUGMENTIN) 875-125 MG tablet; Take 1 tablet by mouth 2 (two) times daily for 7 days.  Dispense: 14 tablet; Refill: 0   Follow Up Instructions: If symptoms worsen or do not improve    I discussed the assessment and treatment plan with the patient. The patient was provided an opportunity to ask questions and all were answered. The patient agreed with the plan and demonstrated an understanding of the instructions.   The patient was advised to call back or seek an in-person evaluation if the symptoms worsen or if the condition fails to improve as anticipated.  The above assessment and management plan was discussed with the patient. The patient verbalized understanding of and has agreed to the management plan. Patient is aware to call the clinic if symptoms persist or worsen. Patient is aware when to return to the clinic for a follow-up visit. Patient educated on when it is appropriate to go to the emergency department.   Time call ended:  11:42 AM  I provided 7 minutes of non-face-to-face time during this encounter.    Jannifer Rodney, FNP '

## 2019-12-07 ENCOUNTER — Other Ambulatory Visit: Payer: Self-pay | Admitting: Family

## 2019-12-07 DIAGNOSIS — F411 Generalized anxiety disorder: Secondary | ICD-10-CM

## 2019-12-30 ENCOUNTER — Telehealth: Payer: Self-pay | Admitting: Family

## 2019-12-30 NOTE — Chronic Care Management (AMB) (Signed)
  Chronic Care Management   Note  12/30/2019 Name: JANAI MAUDLIN MRN: [id number] DOB: 08/19/1941  JUN RIGHTMYER is a 79 y.o. year old female who is a primary care patient of Sharion Balloon, FNP. I reached out to [name] by phone today in response to a referral sent by Ms. Larwance Rote Piscitelli's health plan.     Ms. Cruzen was given information about Chronic Care Management services today including:  1. CCM service includes personalized support from designated clinical staff supervised by her physician, including individualized plan of care and coordination with other care providers 2. 24/7 contact phone numbers for assistance for urgent and routine care needs. 3. Service will only be billed when office clinical staff spend 20 minutes or more in a month to coordinate care. 4. Only one practitioner may furnish and bill the service in a calendar month. 5. The patient may stop CCM services at any time (effective at the end of the month) by phone call to the office staff. 6. The patient will be responsible for cost sharing (co-pay) of up to 20% of the service fee (after annual deductible is met).  Patient agreed to services and verbal consent obtained.   Follow up plan: Telephone appointment with care management team member scheduled for:07/11/2020.  [place], [place] [id number] Direct Dial: [phone] [email or web address]: [email or web address]

## 2020-01-10 ENCOUNTER — Other Ambulatory Visit: Payer: Self-pay | Admitting: Family

## 2020-01-10 DIAGNOSIS — F411 Generalized anxiety disorder: Secondary | ICD-10-CM

## 2020-01-10 DIAGNOSIS — M159 Polyosteoarthritis, unspecified: Secondary | ICD-10-CM

## 2020-01-17 DIAGNOSIS — Z23 Encounter for immunization: Secondary | ICD-10-CM | POA: Diagnosis not present

## 2020-01-18 ENCOUNTER — Other Ambulatory Visit: Payer: Self-pay | Admitting: Family

## 2020-02-02 DIAGNOSIS — Z23 Encounter for immunization: Secondary | ICD-10-CM | POA: Diagnosis not present

## 2020-02-23 ENCOUNTER — Other Ambulatory Visit: Payer: Self-pay | Admitting: Family

## 2020-02-23 DIAGNOSIS — I10 Essential (primary) hypertension: Secondary | ICD-10-CM

## 2020-03-07 ENCOUNTER — Ambulatory Visit (INDEPENDENT_AMBULATORY_CARE_PROVIDER_SITE_OTHER): Payer: Medicare Other | Admitting: Family

## 2020-03-07 ENCOUNTER — Encounter: Payer: Self-pay | Admitting: Family

## 2020-03-07 DIAGNOSIS — I10 Essential (primary) hypertension: Secondary | ICD-10-CM | POA: Diagnosis not present

## 2020-03-07 DIAGNOSIS — B373 Candidiasis of vulva and vagina: Secondary | ICD-10-CM

## 2020-03-07 DIAGNOSIS — B3731 Acute candidiasis of vulva and vagina: Secondary | ICD-10-CM

## 2020-03-07 MED ORDER — FLUCONAZOLE 150 MG PO TABS: 150.0000 mg | ORAL_TABLET | ORAL | 0 refills | Status: DC | PRN

## 2020-03-07 MED ORDER — HYDROCHLOROTHIAZIDE 25 MG PO TABS: 25.0000 mg | ORAL_TABLET | Freq: Every day | ORAL | 2 refills | Status: DC

## 2020-03-07 NOTE — Progress Notes (Signed)
   Virtual Visit via telephone Note Due to COVID-19 pandemic this visit was conducted virtually. This visit type was conducted due to national recommendations for restrictions regarding the COVID-19 Pandemic (e.g. social distancing, sheltering in place) in an effort to limit this patient's exposure and mitigate transmission in our community. All issues noted in this document were discussed and addressed.  A physical exam was not performed with this format.  I connected with Stacey [name] on 03/07/20 at 9:00 AM by telephone and verified that I am speaking with the correct person using two identifiers. Stacey [name] is currently located at home  and no one is currently with her during visit. The provider, Jannifer Rodney, FNP is located in their office at time of visit.  I discussed the limitations, risks, security and privacy concerns of performing an evaluation and management service by telephone and the availability of in person appointments. I also discussed with the patient that there may be a patient responsible charge related to this service. The patient expressed understanding and agreed to proceed.   History and Present Illness:  Vaginal Discharge The patient's primary symptoms include genital itching and vaginal discharge. This is a new problem. The current episode started in the past 7 days. The problem occurs intermittently. The problem has been waxing and waning. The pain is moderate. The vaginal discharge was white. She has tried antifungals for the symptoms. The treatment provided mild relief.  Hypertension This is a chronic problem. The current episode started more than 1 year ago. The problem has been resolved since onset. Pertinent negatives include no malaise/fatigue, peripheral edema or shortness of breath. The current treatment provides moderate improvement.      Review of Systems  Constitutional: Negative for malaise/fatigue.  Respiratory: Negative for shortness of  breath.   Genitourinary: Positive for vaginal discharge.     Observations/Objective: No SOB or distress noted   Assessment and Plan: 1. Vagina, candidiasis Keep clean and dry Do not scratch  - fluconazole (DIFLUCAN) 150 MG tablet; Take 1 tablet (150 mg total) by mouth every three (3) days as needed.  Dispense: 3 tablet; Refill: 0  2. Essential hypertension Continue current medications  - hydrochlorothiazide (HYDRODIURIL) 25 MG tablet; Take 1 tablet (25 mg total) by mouth daily. (Needs to be seen before next refill)  Dispense: 90 tablet; Refill: 2     I discussed the assessment and treatment plan with the patient. The patient was provided an opportunity to ask questions and all were answered. The patient agreed with the plan and demonstrated an understanding of the instructions.   The patient was advised to call back or seek an in-person evaluation if the symptoms worsen or if the condition fails to improve as anticipated.  The above assessment and management plan was discussed with the patient. The patient verbalized understanding of and has agreed to the management plan. Patient is aware to call the clinic if symptoms persist or worsen. Patient is aware when to return to the clinic for a follow-up visit. Patient educated on when it is appropriate to go to the emergency department.   Time call ended:  9:10 AM  I provided 10 minutes of non-face-to-face time during this encounter.    Jannifer Rodney, FNP

## 2020-03-23 ENCOUNTER — Other Ambulatory Visit: Payer: Self-pay | Admitting: Family

## 2020-03-23 ENCOUNTER — Other Ambulatory Visit: Payer: Self-pay

## 2020-03-23 ENCOUNTER — Ambulatory Visit (INDEPENDENT_AMBULATORY_CARE_PROVIDER_SITE_OTHER): Payer: Medicare Other | Admitting: Family

## 2020-03-23 ENCOUNTER — Encounter: Payer: Self-pay | Admitting: Family

## 2020-03-23 DIAGNOSIS — J019 Acute sinusitis, unspecified: Secondary | ICD-10-CM | POA: Diagnosis not present

## 2020-03-23 MED ORDER — AMOXICILLIN-POT CLAVULANATE 875-125 MG PO TABS: 1.0000 | ORAL_TABLET | Freq: Two times a day (BID) | ORAL | 0 refills | Status: DC

## 2020-03-23 MED ORDER — POTASSIUM CHLORIDE ER 10 MEQ PO TBCR: 10.0000 meq | EXTENDED_RELEASE_TABLET | Freq: Two times a day (BID) | ORAL | 1 refills | Status: DC

## 2020-03-23 MED ORDER — FLUCONAZOLE 150 MG PO TABS: 150.0000 mg | ORAL_TABLET | ORAL | 0 refills | Status: DC | PRN

## 2020-03-23 NOTE — Telephone Encounter (Signed)
Rx sent to pharmacy and patient notified.   

## 2020-03-23 NOTE — Telephone Encounter (Signed)
  Prescription Request  03/23/2020  What is the name of the medication or equipment? potassium chloride (KLOR-CON) 10 MEQ tablet   Have you contacted your pharmacy to request a refill? (if applicable) yes  Which pharmacy would you like this sent to? [facility]   Patient notified that their request is being sent to the clinical staff for review and that they should receive a response within 2 business days.

## 2020-03-23 NOTE — Progress Notes (Signed)
   Virtual Visit via telephone Note Due to COVID-19 pandemic this visit was conducted virtually. This visit type was conducted due to national recommendations for restrictions regarding the COVID-19 Pandemic (e.g. social distancing, sheltering in place) in an effort to limit this patient's exposure and mitigate transmission in our community. All issues noted in this document were discussed and addressed.  A physical exam was not performed with this format.  I connected with Stacey [name] on 03/23/20 at 11:18 Am by telephone and verified that I am speaking with the correct person using two identifiers. Stacey [name] is currently located at home and no one  is currently with her during visit. The provider, Jannifer Rodney, FNP is located in their office at time of visit.  I discussed the limitations, risks, security and privacy concerns of performing an evaluation and management service by telephone and the availability of in person appointments. I also discussed with the patient that there may be a patient responsible charge related to this service. The patient expressed understanding and agreed to proceed.   History and Present Illness:  Sinusitis This is a new problem. The current episode started 1 to 4 weeks ago. The problem has been gradually worsening since onset. There has been no fever. Her pain is at a severity of 10/10. The pain is moderate. Associated symptoms include congestion, headaches, sinus pressure and sneezing. Pertinent negatives include no chills, coughing, ear pain or sore throat. Past treatments include oral decongestants. The treatment provided mild relief.      Review of Systems  Constitutional: Negative for chills.  HENT: Positive for congestion, sinus pressure and sneezing. Negative for ear pain and sore throat.   Respiratory: Negative for cough.   Neurological: Positive for headaches.  All other systems reviewed and are negative.    Observations/Objective: No  SOB or distress noted, hoarse voice  Assessment and Plan: 1. Acute sinusitis, recurrence not specified, unspecified location - Take meds as prescribed - Use a cool mist humidifier  -Use saline nose sprays frequently -Force fluids -For any cough or congestion  Use plain Mucinex- regular strength or max strength is fine -For fever or aces or pains- take tylenol or ibuprofen. -Throat lozenges if help -RTO if symptoms worsen or do not improve  - amoxicillin-clavulanate (AUGMENTIN) 875-125 MG tablet; Take 1 tablet by mouth 2 (two) times daily.  Dispense: 14 tablet; Refill: 0       I discussed the assessment and treatment plan with the patient. The patient was provided an opportunity to ask questions and all were answered. The patient agreed with the plan and demonstrated an understanding of the instructions.   The patient was advised to call back or seek an in-person evaluation if the symptoms worsen or if the condition fails to improve as anticipated.  The above assessment and management plan was discussed with the patient. The patient verbalized understanding of and has agreed to the management plan. Patient is aware to call the clinic if symptoms persist or worsen. Patient is aware when to return to the clinic for a follow-up visit. Patient educated on when it is appropriate to go to the emergency department.   Time call ended:  11:25 AM   I provided 7 minutes of non-face-to-face time during this encounter.    Jannifer Rodney, FNP

## 2020-04-17 ENCOUNTER — Other Ambulatory Visit: Payer: Self-pay | Admitting: Family

## 2020-04-17 DIAGNOSIS — M159 Polyosteoarthritis, unspecified: Secondary | ICD-10-CM

## 2020-04-18 NOTE — Telephone Encounter (Signed)
[facility] NTBS LOV 09/16/19 for chronic ckup

## 2020-05-23 ENCOUNTER — Other Ambulatory Visit: Payer: Self-pay

## 2020-05-23 ENCOUNTER — Encounter: Payer: Self-pay | Admitting: Family

## 2020-05-23 ENCOUNTER — Ambulatory Visit (INDEPENDENT_AMBULATORY_CARE_PROVIDER_SITE_OTHER): Payer: Medicare Other | Admitting: Family

## 2020-05-23 VITALS — BP 121/80 | HR 94 | Temp 97.8°F | Ht 63.0 in | Wt 177.4 lb

## 2020-05-23 DIAGNOSIS — M109 Gout, unspecified: Secondary | ICD-10-CM

## 2020-05-23 MED ORDER — COLCHICINE 0.6 MG PO TABS: ORAL_TABLET | ORAL | 2 refills | Status: DC

## 2020-05-23 MED ORDER — PREDNISONE 10 MG (21) PO TBPK: ORAL_TABLET | ORAL | 0 refills | Status: DC

## 2020-05-23 MED ORDER — POTASSIUM CHLORIDE ER 10 MEQ PO TBCR: 10.0000 meq | EXTENDED_RELEASE_TABLET | Freq: Two times a day (BID) | ORAL | 1 refills | Status: DC

## 2020-05-23 NOTE — Progress Notes (Signed)
   Subjective:    Patient ID: Stacey [name], female    DOB: 1941-03-25, 79 y.o.   MRN: [id number]  Chief Complaint  Patient presents with  . Gout    left foot started sevral days ago     HPI PT presents to the office today with gout in left great toe. She reports she is having swelling and tenderness that started several days ago. She states the pain is worsening and she could not sleep last night. She reports her pain is constant aching pain of 10 out 10. She has had gout in the past, but it has been several years ago.   Review of Systems  Musculoskeletal: Positive for joint swelling.  All other systems reviewed and are negative.      Objective:   Physical Exam Vitals reviewed.  Constitutional:      General: She is not in acute distress.    Appearance: She is well-developed.  HENT:     Head: Normocephalic and atraumatic.  Eyes:     Pupils: Pupils are equal, round, and reactive to light.  Neck:     Thyroid: No thyromegaly.  Cardiovascular:     Rate and Rhythm: Normal rate and regular rhythm.     Heart sounds: Normal heart sounds. No murmur heard.   Pulmonary:     Effort: Pulmonary effort is normal. No respiratory distress.     Breath sounds: Normal breath sounds. No wheezing.  Abdominal:     General: Bowel sounds are normal. There is no distension.     Palpations: Abdomen is soft.     Tenderness: There is no abdominal tenderness.  Musculoskeletal:        General: Tenderness (base of left great toe, warmth, redness, and tenderness present ) present. Normal range of motion.     Cervical back: Normal range of motion and neck supple.  Skin:    General: Skin is warm and dry.     Findings: Erythema present.  Neurological:     Mental Status: She is alert and oriented to person, place, and time.     Cranial Nerves: No cranial nerve deficit.     Deep Tendon Reflexes: Reflexes are normal and symmetric.  Psychiatric:        Behavior: Behavior normal.        Thought  Content: Thought content normal.        Judgment: Judgment normal.     BP 121/80   Pulse 94   Temp 97.8 F (36.6 C) (Temporal)   Ht 5\' 3"  (1.6 m)   Wt 177 lb 6.4 oz (80.5 kg)   SpO2 94%   BMI 31.42 kg/m        Assessment & Plan:  ZEPHANIAH ENYEART comes in today with chief complaint of Gout (left foot started sevral days ago )   Diagnosis and orders addressed:  1. Acute gout involving toe of left foot, unspecified cause Force fluids No other NSAID"s  Keep Follow up for next week Low purine diet If continues will need to change HCTZ - predniSONE (STERAPRED UNI-PAK 21 TAB) 10 MG (21) TBPK tablet; Use as directed  Dispense: 21 tablet; Refill: 0 - colchicine 0.6 MG tablet; Take 1.2 mg today then one hour late take 0.6 mg for a max 1.8 mg/day  Dispense: 20 tablet; Refill: 2  Stacey Dare, FNP

## 2020-05-23 NOTE — Patient Instructions (Signed)
Gout  Gout is a condition that causes painful swelling of the joints. Gout is a type of inflammation of the joints (arthritis). This condition is caused by having too much uric acid in the body. Uric acid is a chemical that forms when the body breaks down substances called purines. Purines are important for building body proteins. When the body has too much uric acid, sharp crystals can form and build up inside the joints. This causes pain and swelling. Gout attacks can happen quickly and may be very painful (acute gout). Over time, the attacks can affect more joints and become more frequent (chronic gout). Gout can also cause uric acid to build up under the skin and inside the kidneys. What are the causes? This condition is caused by too much uric acid in your blood. This can happen because:  Your kidneys do not remove enough uric acid from your blood. This is the most common cause.  Your body makes too much uric acid. This can happen with some cancers and cancer treatments. It can also occur if your body is breaking down too many red blood cells (hemolytic anemia).  You eat too many foods that are high in purines. These foods include organ meats and some seafood. Alcohol, especially beer, is also high in purines. A gout attack may be triggered by trauma or stress. What increases the risk? You are more likely to develop this condition if you:  Have a family history of gout.  Are female and middle-aged.  Are female and have gone through menopause.  Are obese.  Frequently drink alcohol, especially beer.  Are dehydrated.  Lose weight too quickly.  Have an organ transplant.  Have lead poisoning.  Take certain medicines, including aspirin, cyclosporine, diuretics, levodopa, and niacin.  Have kidney disease.  Have a skin condition called psoriasis. What are the signs or symptoms? An attack of acute gout happens quickly. It usually occurs in just one joint. The most common place is  the big toe. Attacks often start at night. Other joints that may be affected include joints of the feet, ankle, knee, fingers, wrist, or elbow. Symptoms of this condition may include:  Severe pain.  Warmth.  Swelling.  Stiffness.  Tenderness. The affected joint may be very painful to touch.  Shiny, red, or purple skin.  Chills and fever. Chronic gout may cause symptoms more frequently. More joints may be involved. You may also have white or yellow lumps (tophi) on your hands or feet or in other areas near your joints. How is this diagnosed? This condition is diagnosed based on your symptoms, medical history, and physical exam. You may have tests, such as:  Blood tests to measure uric acid levels.  Removal of joint fluid with a thin needle (aspiration) to look for uric acid crystals.  X-rays to look for joint damage. How is this treated? Treatment for this condition has two phases: treating an acute attack and preventing future attacks. Acute gout treatment may include medicines to reduce pain and swelling, including:  NSAIDs.  Steroids. These are strong anti-inflammatory medicines that can be taken by mouth (orally) or injected into a joint.  Colchicine. This medicine relieves pain and swelling when it is taken soon after an attack. It can be given by mouth or through an IV. Preventive treatment may include:  Daily use of smaller doses of NSAIDs or colchicine.  Use of a medicine that reduces uric acid levels in your blood.  Changes to your diet. You may   need to see a dietitian about what to eat and drink to prevent gout. Follow these instructions at home: During a gout attack   If directed, put ice on the affected area: ? Put ice in a plastic bag. ? Place a towel between your skin and the bag. ? Leave the ice on for 20 minutes, 2-3 times a day.  Raise (elevate) the affected joint above the level of your heart as often as possible.  Rest the joint as much as possible.  If the affected joint is in your leg, you may be given crutches to use.  Follow instructions from your health care provider about eating or drinking restrictions. Avoiding future gout attacks  Follow a low-purine diet as told by your dietitian or health care provider. Avoid foods and drinks that are high in purines, including liver, kidney, anchovies, asparagus, herring, mushrooms, mussels, and beer.  Maintain a healthy weight or lose weight if you are overweight. If you want to lose weight, talk with your health care provider. It is important that you do not lose weight too quickly.  Start or maintain an exercise program as told by your health care provider. Eating and drinking  Drink enough fluids to keep your urine pale yellow.  If you drink alcohol: ? Limit how much you use to:  0-1 drink a day for women.  0-2 drinks a day for men. ? Be aware of how much alcohol is in your drink. In the [place] one drink equals one 12 oz bottle of beer (355 mL) one 5 oz glass of wine (148 mL), or one 1 oz glass of hard liquor (44 mL). General instructions  Take over-the-counter and prescription medicines only as told by your health care provider.  Do not drive or use heavy machinery while taking prescription pain medicine.  Return to your normal activities as told by your health care provider. Ask your health care provider what activities are safe for you.  Keep all follow-up visits as told by your health care provider. This is important. Contact a health care provider if you have:  Another gout attack.  Continuing symptoms of a gout attack after 10 days of treatment.  Side effects from your medicines.  Chills or a fever.  Burning pain when you urinate.  Pain in your lower back or belly. Get help right away if you:  Have severe or uncontrolled pain.  Cannot urinate. Summary  Gout is painful swelling of the joints caused by inflammation.  The most common site of pain is the big  toe, but it can affect other joints in the body.  Medicines and dietary changes can help to prevent and treat gout attacks. This information is not intended to replace advice given to you by your health care provider. Make sure you discuss any questions you have with your health care provider. Document Revised: 04/15/2018 Document Reviewed: 04/15/2018 Elsevier Patient Education  2020 Elsevier Inc.  

## 2020-05-31 ENCOUNTER — Encounter: Payer: Self-pay | Admitting: Family

## 2020-05-31 ENCOUNTER — Other Ambulatory Visit: Payer: Self-pay

## 2020-05-31 ENCOUNTER — Ambulatory Visit (INDEPENDENT_AMBULATORY_CARE_PROVIDER_SITE_OTHER): Payer: Medicare Other | Admitting: Family

## 2020-05-31 VITALS — BP 123/80 | HR 77 | Temp 97.6°F | Ht 63.0 in | Wt 177.0 lb

## 2020-05-31 DIAGNOSIS — M8949 Other hypertrophic osteoarthropathy, multiple sites: Secondary | ICD-10-CM

## 2020-05-31 DIAGNOSIS — E785 Hyperlipidemia, unspecified: Secondary | ICD-10-CM | POA: Diagnosis not present

## 2020-05-31 DIAGNOSIS — I10 Essential (primary) hypertension: Secondary | ICD-10-CM

## 2020-05-31 DIAGNOSIS — K2100 Gastro-esophageal reflux disease with esophagitis, without bleeding: Secondary | ICD-10-CM | POA: Diagnosis not present

## 2020-05-31 DIAGNOSIS — Z1159 Encounter for screening for other viral diseases: Secondary | ICD-10-CM

## 2020-05-31 DIAGNOSIS — G8929 Other chronic pain: Secondary | ICD-10-CM

## 2020-05-31 DIAGNOSIS — E119 Type 2 diabetes mellitus without complications: Secondary | ICD-10-CM | POA: Diagnosis not present

## 2020-05-31 DIAGNOSIS — M5441 Lumbago with sciatica, right side: Secondary | ICD-10-CM

## 2020-05-31 DIAGNOSIS — F411 Generalized anxiety disorder: Secondary | ICD-10-CM | POA: Diagnosis not present

## 2020-05-31 DIAGNOSIS — M159 Polyosteoarthritis, unspecified: Secondary | ICD-10-CM

## 2020-05-31 LAB — BAYER DCA HB A1C WAIVED: HB A1C (BAYER DCA - WAIVED): 6.3 % (ref <7.0)

## 2020-05-31 NOTE — Patient Instructions (Signed)
Peripheral Edema  Peripheral edema is swelling that is caused by a buildup of fluid. Peripheral edema most often affects the lower legs, ankles, and feet. It can also develop in the arms, hands, and face. The area of the body that has peripheral edema will look swollen. It may also feel heavy or warm. Your clothes may start to feel tight. Pressing on the area may make a temporary dent in your skin. You may not be able to move your swollen arm or leg as much as usual. There are many causes of peripheral edema. It can happen because of a complication of other conditions such as congestive heart failure, kidney disease, or a problem with your blood circulation. It also can be a side effect of certain medicines or because of an infection. It often happens to women during pregnancy. Sometimes, the cause is not known. Follow these instructions at home: Managing pain, stiffness, and swelling   Raise (elevate) your legs while you are sitting or lying down.  Move around often to prevent stiffness and to lessen swelling.  Do not sit or stand for long periods of time.  Wear support stockings as told by your health care provider. Medicines  Take over-the-counter and prescription medicines only as told by your health care provider.  Your health care provider may prescribe medicine to help your body get rid of excess water (diuretic). General instructions  Pay attention to any changes in your symptoms.  Follow instructions from your health care provider about limiting salt (sodium) in your diet. Sometimes, eating less salt may reduce swelling.  Moisturize skin daily to help prevent skin from cracking and draining.  Keep all follow-up visits as told by your health care provider. This is important. Contact a health care provider if you have:  A fever.  Edema that starts suddenly or is getting worse, especially if you are pregnant or have a medical condition.  Swelling in only one leg.  Increased  swelling, redness, or pain in one or both of your legs.  Drainage or sores at the area where you have edema. Get help right away if you:  Develop shortness of breath, especially when you are lying down.  Have pain in your chest or abdomen.  Feel weak.  Feel faint. Summary  Peripheral edema is swelling that is caused by a buildup of fluid. Peripheral edema most often affects the lower legs, ankles, and feet.  Move around often to prevent stiffness and to lessen swelling. Do not sit or stand for long periods of time.  Pay attention to any changes in your symptoms.  Contact a health care provider if you have edema that starts suddenly or is getting worse, especially if you are pregnant or have a medical condition.  Get help right away if you develop shortness of breath, especially when lying down. This information is not intended to replace advice given to you by your health care provider. Make sure you discuss any questions you have with your health care provider. Document Revised: 06/17/2018 Document Reviewed: 06/17/2018 Elsevier Patient Education  2020 Elsevier Inc.  

## 2020-05-31 NOTE — Progress Notes (Signed)
Subjective:    Patient ID: Stacey [name], female    DOB: 1941-03-13, 79 y.o.   MRN: [id number]  Chief Complaint  Patient presents with   Medical Management of Chronic Issues   Pt presents to the office today for chronic follow up. She was just seen on 05/23/20 for gout in left great toe. She reports this has resolved.  Anxiety Presents for follow-up visit. Symptoms include depressed mood, excessive worry, irritability and restlessness. Patient reports no shortness of breath. Symptoms occur rarely.    Hypertension This is a chronic problem. The current episode started more than 1 year ago. The problem has been resolved since onset. Associated symptoms include peripheral edema ("some"). Pertinent negatives include no blurred vision, malaise/fatigue or shortness of breath. Risk factors for coronary artery disease include obesity, sedentary lifestyle and dyslipidemia. The current treatment provides moderate improvement. There is no history of CVA or PVD.  Gastroesophageal Reflux She complains of belching and heartburn. This is a chronic problem. The problem occurs occasionally. The problem has been resolved. Risk factors include obesity. She has tried a PPI for the symptoms. The treatment provided moderate relief.  Diabetes She presents for her follow-up diabetic visit. She has type 2 diabetes mellitus. There are no hypoglycemic associated symptoms. Pertinent negatives for diabetes include no blurred vision and no foot paresthesias. There are no hypoglycemic complications. Symptoms are stable. Pertinent negatives for diabetic complications include no CVA, nephropathy, peripheral neuropathy or PVD. She is following a generally healthy diet. She has had a previous visit with a dietitian. (Does not check BS at home)  Arthritis Presents for follow-up visit. She complains of pain and stiffness. The symptoms have been stable. Affected locations include the left knee, right knee, right MCP and left  MCP. Her pain is at a severity of 7/10.  Hyperlipidemia This is a chronic problem. The current episode started more than 1 year ago. The problem is uncontrolled. Recent lipid tests were reviewed and are high. Pertinent negatives include no shortness of breath. Current antihyperlipidemic treatment includes statins. The current treatment provides moderate improvement of lipids. Risk factors for coronary artery disease include diabetes mellitus, hypertension, post-menopausal, a sedentary lifestyle and obesity.      Review of Systems  Constitutional: Positive for irritability. Negative for malaise/fatigue.  Eyes: Negative for blurred vision.  Respiratory: Negative for shortness of breath.   Gastrointestinal: Positive for heartburn.  Musculoskeletal: Positive for arthritis and stiffness.  All other systems reviewed and are negative.      Objective:   Physical Exam Vitals reviewed.  Constitutional:      General: She is not in acute distress.    Appearance: She is well-developed. She is obese.  HENT:     Head: Normocephalic and atraumatic.     Right Ear: Tympanic membrane normal.     Left Ear: Tympanic membrane normal.  Eyes:     Pupils: Pupils are equal, round, and reactive to light.  Neck:     Thyroid: No thyromegaly.  Cardiovascular:     Rate and Rhythm: Normal rate and regular rhythm.     Heart sounds: Normal heart sounds. No murmur heard.   Pulmonary:     Effort: Pulmonary effort is normal. No respiratory distress.     Breath sounds: Normal breath sounds. No wheezing.  Abdominal:     General: Bowel sounds are normal. There is no distension.     Palpations: Abdomen is soft.     Tenderness: There is no abdominal tenderness.  Musculoskeletal:        General: No tenderness. Normal range of motion.     Cervical back: Normal range of motion and neck supple.  Skin:    General: Skin is warm and dry.  Neurological:     Mental Status: She is alert and oriented to person, place,  and time.     Cranial Nerves: No cranial nerve deficit.     Deep Tendon Reflexes: Reflexes are normal and symmetric.  Psychiatric:        Behavior: Behavior normal.        Thought Content: Thought content normal.        Judgment: Judgment normal.     Diabetic Foot Exam - Simple   Simple Foot Form Diabetic Foot exam was performed with the following findings: Yes 05/31/2020 12:16 PM  Visual Inspection No deformities, no ulcerations, no other skin breakdown bilaterally: Yes Sensation Testing Intact to touch and monofilament testing bilaterally: Yes Pulse Check Posterior Tibialis and Dorsalis pulse intact bilaterally: Yes Comments    BP 123/80    Pulse 77    Temp 97.6 F (36.4 C) (Temporal)    Ht '5\' 3"'  (1.6 m)    Wt 177 lb (80.3 kg)    SpO2 95%    BMI 31.35 kg/m       Assessment & Plan:  RESHMA HOEY comes in today with chief complaint of Medical Management of Chronic Issues   Diagnosis and orders addressed:  1. Type 2 diabetes mellitus without complication, without long-term current use of insulin (HCC) - Bayer DCA Hb A1c Waived - Microalbumin / creatinine urine ratio - CMP14+EGFR - CBC with Differential/Platelet  2. Essential hypertension - CMP14+EGFR - CBC with Differential/Platelet  3. Gastroesophageal reflux disease with esophagitis, unspecified whether hemorrhage - CMP14+EGFR - CBC with Differential/Platelet  4. Primary osteoarthritis involving multiple joints - CMP14+EGFR - CBC with Differential/Platelet  5. Morbid obesity ([place]) - CMP14+EGFR - CBC with Differential/Platelet  6. Hyperlipidemia, unspecified hyperlipidemia type - CMP14+EGFR - CBC with Differential/Platelet - Lipid panel  7. GAD (generalized anxiety disorder) - CMP14+EGFR - CBC with Differential/Platelet  8. Chronic bilateral low back pain with right-sided sciatica - CMP14+EGFR - CBC with Differential/Platelet  9. Need for hepatitis C screening test  - CMP14+EGFR - CBC with  Differential/Platelet - Hepatitis C antibody   Labs pending Health Maintenance reviewed Diet and exercise encouraged  Follow up plan: 6 months    Evelina Dun, FNP

## 2020-06-01 LAB — CBC WITH DIFFERENTIAL/PLATELET
Basophils Absolute: 0 10*3/uL (ref 0.0–0.2)
Basos: 1 %
EOS (ABSOLUTE): 0.2 10*3/uL (ref 0.0–0.4)
Eos: 2 %
Hematocrit: 40.1 % (ref 34.0–46.6)
Hemoglobin: 13.1 g/dL (ref 11.1–15.9)
Immature Grans (Abs): 0 10*3/uL (ref 0.0–0.1)
Immature Granulocytes: 0 %
Lymphocytes Absolute: 3.5 10*3/uL — ABNORMAL HIGH (ref 0.7–3.1)
Lymphs: 40 %
MCH: 29.9 pg (ref 26.6–33.0)
MCHC: 32.7 g/dL (ref 31.5–35.7)
MCV: 92 fL (ref 79–97)
Monocytes Absolute: 0.7 10*3/uL (ref 0.1–0.9)
Monocytes: 8 %
Neutrophils Absolute: 4.4 10*3/uL (ref 1.4–7.0)
Neutrophils: 49 %
Platelets: 250 10*3/uL (ref 150–450)
RBC: 4.38 x10E6/uL (ref 3.77–5.28)
RDW: 12.5 % (ref 11.7–15.4)
WBC: 8.8 10*3/uL (ref 3.4–10.8)

## 2020-06-01 LAB — CMP14+EGFR
ALT: 17 IU/L (ref 0–32)
AST: 17 IU/L (ref 0–40)
Albumin/Globulin Ratio: 1.6 (ref 1.2–2.2)
Albumin: 4.2 g/dL (ref 3.7–4.7)
Alkaline Phosphatase: 66 IU/L (ref 48–121)
BUN/Creatinine Ratio: 21 (ref 12–28)
BUN: 20 mg/dL (ref 8–27)
Bilirubin Total: 0.3 mg/dL (ref 0.0–1.2)
CO2: 29 mmol/L (ref 20–29)
Calcium: 9.2 mg/dL (ref 8.7–10.3)
Chloride: 96 mmol/L (ref 96–106)
Creatinine, Ser: 0.94 mg/dL (ref 0.57–1.00)
GFR calc Af Amer: 68 mL/min/{1.73_m2} (ref >59)
GFR calc non Af Amer: 59 mL/min/{1.73_m2} — ABNORMAL LOW (ref >59)
Globulin, Total: 2.6 g/dL (ref 1.5–4.5)
Glucose: 104 mg/dL — ABNORMAL HIGH (ref 65–99)
Potassium: 3.9 mmol/L (ref 3.5–5.2)
Sodium: 140 mmol/L (ref 134–144)
Total Protein: 6.8 g/dL (ref 6.0–8.5)

## 2020-06-01 LAB — LIPID PANEL
Chol/HDL Ratio: 4.1 ratio (ref 0.0–4.4)
Cholesterol, Total: 230 mg/dL — ABNORMAL HIGH (ref 100–199)
HDL: 56 mg/dL (ref >39)
LDL Chol Calc (NIH): 141 mg/dL — ABNORMAL HIGH (ref 0–99)
Triglycerides: 183 mg/dL — ABNORMAL HIGH (ref 0–149)
VLDL Cholesterol Cal: 33 mg/dL (ref 5–40)

## 2020-06-01 LAB — HEPATITIS C ANTIBODY: Hep C Virus Ab: <0.1 s/co ratio (ref 0.0–0.9)

## 2020-06-05 ENCOUNTER — Other Ambulatory Visit: Payer: Self-pay | Admitting: Family

## 2020-06-05 MED ORDER — ATORVASTATIN CALCIUM 20 MG PO TABS
20.0000 mg | ORAL_TABLET | Freq: Every day | ORAL | 3 refills | Status: DC
Start: 2020-06-05 — End: 2021-05-31

## 2020-06-09 ENCOUNTER — Telehealth: Payer: Self-pay | Admitting: Family

## 2020-06-09 MED ORDER — PREDNISONE 10 MG (21) PO TBPK
ORAL_TABLET | ORAL | 0 refills | Status: DC
Start: 2020-06-09 — End: 2020-12-01

## 2020-06-09 NOTE — Telephone Encounter (Signed)
Has she taken the Colchicine as prescribed for flare up? We can add prednisone if pain continues.

## 2020-06-09 NOTE — Telephone Encounter (Signed)
Pt aware.

## 2020-06-09 NOTE — Telephone Encounter (Signed)
Prednisone Prescription sent to pharmacy   

## 2020-06-09 NOTE — Telephone Encounter (Signed)
Pts states that she was seen 8/17 for gout and she believes that it is coming back. She is wanting to speak with the nurse.

## 2020-06-09 NOTE — Telephone Encounter (Signed)
Pt states she is taking the Colchicine and it isn't working.

## 2020-06-09 NOTE — Addendum Note (Signed)
Addended by: Jannifer Rodney A on: 06/09/2020 02:05 PM   Modules accepted: Orders

## 2020-06-09 NOTE — Telephone Encounter (Signed)
Pt now has a gout flare in 2nd toe on right foot, painful and can't walk, please advise

## 2020-07-11 ENCOUNTER — Telehealth: Payer: Medicare Other | Admitting: *Deleted

## 2020-07-11 ENCOUNTER — Telehealth: Payer: Self-pay | Admitting: *Deleted

## 2020-07-11 NOTE — Telephone Encounter (Signed)
  Chronic Care Management   Outreach Note  07/11/2020 Name: Stacey [name] MRN: [id number] DOB: [birth year]  Referred by: [name], FNP Reason for referral : Chronic Care Management (Initial visit)   An unsuccessful Initial Telephone Visit was attempted today. The patient was referred to the case management team for assistance with care management and care coordination.   Clinical Goals: . Over the next 10 days, patient will be contacted by a Care Guide to reschedule their Initial CCM Visit . Over the next 30 days, patient will have an Initial CCM Visit with a member of the embedded CCM team to discuss self-management of their chronic medical conditions  Interventions and Plan . Chart reviewed in preparation for initial visit telephone call . Collaboration with other care team members as needed . Unsuccessful outreach to patient  . Request sent to care guides to reach out and reschedule patient's initial visit   Demetrios Loll, BSN, RN-BC Embedded Chronic Care Manager [facility] Care Management Direct Dial: [phone]

## 2020-08-16 ENCOUNTER — Other Ambulatory Visit: Payer: Self-pay | Admitting: Family

## 2020-08-23 DIAGNOSIS — Z23 Encounter for immunization: Secondary | ICD-10-CM | POA: Diagnosis not present

## 2020-08-24 DIAGNOSIS — Z23 Encounter for immunization: Secondary | ICD-10-CM | POA: Diagnosis not present

## 2020-09-07 ENCOUNTER — Telehealth: Payer: Self-pay | Admitting: *Deleted

## 2020-09-07 NOTE — Chronic Care Management (AMB) (Signed)
  Care Management   Note  09/07/2020 Name: [name] MRN: [id number] DOB: 1940-11-13  Stacey [name] is a 79 y.o. year old female who is a primary care patient of [name], FNP and is actively engaged with the care management team. I reached out to [name] by phone today to assist with re-scheduling an initial visit with the RN Case Manager.  Follow up plan: Unsuccessful telephone outreach attempt made. The care management team will reach out to the patient again over the next 7 days. If patient returns call to provider office, please advise to call Embedded Care Management Care Guide Gwenevere Ghazi at [phone].  Gwenevere Ghazi  Care Guide, Embedded Care Coordination [facility] Management

## 2020-09-07 NOTE — Telephone Encounter (Signed)
R/s

## 2020-09-11 DIAGNOSIS — J0100 Acute maxillary sinusitis, unspecified: Secondary | ICD-10-CM | POA: Diagnosis not present

## 2020-09-11 DIAGNOSIS — H9209 Otalgia, unspecified ear: Secondary | ICD-10-CM | POA: Diagnosis not present

## 2020-09-11 DIAGNOSIS — R519 Headache, unspecified: Secondary | ICD-10-CM | POA: Diagnosis not present

## 2020-09-11 DIAGNOSIS — J029 Acute pharyngitis, unspecified: Secondary | ICD-10-CM | POA: Diagnosis not present

## 2020-09-11 DIAGNOSIS — K068 Other specified disorders of gingiva and edentulous alveolar ridge: Secondary | ICD-10-CM | POA: Diagnosis not present

## 2020-09-12 NOTE — Chronic Care Management (AMB) (Signed)
  Care Management   Note  09/12/2020 Name: Stacey [name] MRN: [id number] DOB: 1940/10/17  Stacey [name] is a 79 y.o. year old female who is a primary care patient of [name], FNP and is actively engaged with the care management team. I reached out to [name] by phone today to assist with re-scheduling an initial visit with the RN Case Manager.  Follow up plan: Telephone appointment with care management team member scheduled for:10/10/2020  [facility] Guide, Embedded Care Coordination [facility] Management

## 2020-09-12 NOTE — Telephone Encounter (Signed)
Spoke to patient rescheduled for initial call with Demetrios Loll on 10/10/2020

## 2020-10-10 ENCOUNTER — Encounter: Payer: Self-pay | Admitting: *Deleted

## 2020-10-10 ENCOUNTER — Ambulatory Visit: Payer: Medicare Other | Admitting: *Deleted

## 2020-10-10 DIAGNOSIS — M159 Polyosteoarthritis, unspecified: Secondary | ICD-10-CM

## 2020-10-10 DIAGNOSIS — M150 Primary generalized (osteo)arthritis: Secondary | ICD-10-CM

## 2020-10-10 DIAGNOSIS — M8949 Other hypertrophic osteoarthropathy, multiple sites: Secondary | ICD-10-CM

## 2020-10-10 DIAGNOSIS — I10 Essential (primary) hypertension: Secondary | ICD-10-CM

## 2020-10-10 NOTE — Chronic Care Management (AMB) (Signed)
  Chronic Care Management   Initial Visit Note  10/10/2020 Name: TEASHA MURRILLO MRN: [id number] DOB: 30-Jun-1941  Referred by: [name], FNP Reason for referral : Chronic Care Management (Initial Visit)   JESIAH YERBY is a 80 y.o. year old female who is a primary care patient of [name], FNP. The CCM team was consulted for assistance with chronic disease management and care coordination needs related to HTN, HLD and arthritis.  Review of patient status, including review of consultants reports, relevant laboratory and other test results was performed prior to outreach.   I spoke with [name] by telephone today regarding management of their chronic medical conditions. They do not have any CCM or resource needs and feel that their medical conditions are well managed. They appreciated the outreach, but do not feel that CCM services are needed at this time. They will reach out in the future if a need arises.   SDOH (Social Determinants of Health) assessments performed: Yes See Care Plan activities for detailed interventions related to SDOH     Plan:  . The patient has been provided with contact information for the care management team and has been advised to call if they would like to re-enroll in the CCM program to receive care management and care coordination services.  . CCM enrollment status changed to "previously enrolled" on 10/10/2020. Case closed to case management services in primary care home.  . Patient was provided with general disease management education and encouraged to follow-up with their PCP and specialists as recommended.   Demetrios Loll, BSN, RN-BC Embedded Chronic Care Manager [facility] Care Management Direct Dial: [phone]

## 2020-10-10 NOTE — Patient Instructions (Signed)
Plan:  . The patient has been provided with contact information for the care management team and has been advised to call if they would like to re-enroll in the CCM program to receive care management and care coordination services.  . CCM enrollment status changed to "previously enrolled" on 10/10/2020. Case closed to case management services in primary care home.  . Patient was provided with general disease management education and encouraged to follow-up with their PCP and specialists as recommended.   Demetrios Loll, BSN, RN-BC Embedded Chronic Care Manager [facility] Care Management Direct Dial: [phone]

## 2020-10-16 ENCOUNTER — Other Ambulatory Visit: Payer: Self-pay | Admitting: Family

## 2020-12-01 ENCOUNTER — Ambulatory Visit (INDEPENDENT_AMBULATORY_CARE_PROVIDER_SITE_OTHER): Payer: Medicare Other | Admitting: Family

## 2020-12-01 ENCOUNTER — Other Ambulatory Visit: Payer: Self-pay

## 2020-12-01 ENCOUNTER — Encounter: Payer: Self-pay | Admitting: Family

## 2020-12-01 VITALS — BP 127/68 | HR 87 | Temp 97.3°F | Ht 63.0 in | Wt 178.2 lb

## 2020-12-01 DIAGNOSIS — E119 Type 2 diabetes mellitus without complications: Secondary | ICD-10-CM

## 2020-12-01 DIAGNOSIS — E559 Vitamin D deficiency, unspecified: Secondary | ICD-10-CM | POA: Diagnosis not present

## 2020-12-01 DIAGNOSIS — I10 Essential (primary) hypertension: Secondary | ICD-10-CM

## 2020-12-01 DIAGNOSIS — F411 Generalized anxiety disorder: Secondary | ICD-10-CM | POA: Diagnosis not present

## 2020-12-01 DIAGNOSIS — M8949 Other hypertrophic osteoarthropathy, multiple sites: Secondary | ICD-10-CM | POA: Diagnosis not present

## 2020-12-01 DIAGNOSIS — G8929 Other chronic pain: Secondary | ICD-10-CM | POA: Diagnosis not present

## 2020-12-01 DIAGNOSIS — K2100 Gastro-esophageal reflux disease with esophagitis, without bleeding: Secondary | ICD-10-CM

## 2020-12-01 DIAGNOSIS — E785 Hyperlipidemia, unspecified: Secondary | ICD-10-CM | POA: Diagnosis not present

## 2020-12-01 DIAGNOSIS — M159 Polyosteoarthritis, unspecified: Secondary | ICD-10-CM

## 2020-12-01 DIAGNOSIS — M150 Primary generalized (osteo)arthritis: Secondary | ICD-10-CM

## 2020-12-01 DIAGNOSIS — M5441 Lumbago with sciatica, right side: Secondary | ICD-10-CM | POA: Diagnosis not present

## 2020-12-01 MED ORDER — MELOXICAM 7.5 MG PO TABS: 7.5000 mg | ORAL_TABLET | Freq: Every day | ORAL | 1 refills | Status: DC

## 2020-12-01 NOTE — Progress Notes (Signed)
Subjective:    Patient ID: Stacey [name], female    DOB: 05/23/1941, 80 y.o.   MRN: [id number]  Chief Complaint  Patient presents with  . Diabetes   Pt presents to the office today for chronic follow up.  Diabetes She presents for her follow-up diabetic visit. She has type 2 diabetes mellitus. Her disease course has been stable. Pertinent negatives for diabetes include no blurred vision and no foot paresthesias. Symptoms are stable. Pertinent negatives for diabetic complications include no CVA, heart disease, nephropathy or peripheral neuropathy. Risk factors for coronary artery disease include dyslipidemia, hypertension, diabetes mellitus and sedentary lifestyle. (Does not check BS at home ) An ACE inhibitor/angiotensin II receptor blocker is not being taken. Eye exam is not current.  Gastroesophageal Reflux She complains of belching and heartburn. This is a chronic problem. The current episode started more than 1 year ago. The problem occurs occasionally. The problem has been waxing and waning. Risk factors include obesity. She has tried an antacid for the symptoms. The treatment provided mild relief.  Arthritis Presents for follow-up visit. She complains of pain and stiffness. Affected locations include the left knee, right knee, right MCP and left MCP. Her pain is at a severity of 10/10.  Hyperlipidemia This is a chronic problem. The current episode started more than 1 year ago. Exacerbating diseases include obesity. Current antihyperlipidemic treatment includes statins. The current treatment provides moderate improvement of lipids.  Anxiety Presents for follow-up visit. Symptoms include excessive worry, irritability and restlessness. Symptoms occur occasionally. The severity of symptoms is moderate.    Back Pain This is a chronic problem. The current episode started more than 1 year ago. The problem occurs intermittently. The problem has been waxing and waning since onset. The pain is  present in the lumbar spine. The pain is mild.      Review of Systems  Constitutional: Positive for irritability.  Eyes: Negative for blurred vision.  Gastrointestinal: Positive for heartburn.  Musculoskeletal: Positive for arthritis, back pain and stiffness.  All other systems reviewed and are negative.      Objective:   Physical Exam Vitals reviewed.  Constitutional:      General: She is not in acute distress.    Appearance: She is well-developed and well-nourished.  HENT:     Head: Normocephalic and atraumatic.     Right Ear: Tympanic membrane normal.     Left Ear: Tympanic membrane normal.     Mouth/Throat:     Mouth: Oropharynx is clear and moist.  Eyes:     Pupils: Pupils are equal, round, and reactive to light.  Neck:     Thyroid: No thyromegaly.  Cardiovascular:     Rate and Rhythm: Normal rate and regular rhythm.     Pulses: Intact distal pulses.     Heart sounds: Normal heart sounds. No murmur heard.   Pulmonary:     Effort: Pulmonary effort is normal. No respiratory distress.     Breath sounds: Normal breath sounds. No wheezing.  Abdominal:     General: Bowel sounds are normal. There is no distension.     Palpations: Abdomen is soft.     Tenderness: There is no abdominal tenderness.  Musculoskeletal:        General: No tenderness or edema. Normal range of motion.     Cervical back: Normal range of motion and neck supple.  Skin:    General: Skin is warm and dry.  Neurological:     Mental Status:  She is alert and oriented to person, place, and time.     Cranial Nerves: No cranial nerve deficit.     Deep Tendon Reflexes: Reflexes are normal and symmetric.  Psychiatric:        Mood and Affect: Mood and affect normal.        Behavior: Behavior normal.        Thought Content: Thought content normal.        Judgment: Judgment normal.       BP 127/68   Pulse 87   Temp (!) 97.3 F (36.3 C) (Temporal)   Ht '5\' 3"'  (1.6 m)   Wt 178 lb 3.2 oz (80.8 kg)    SpO2 97%   BMI 31.57 kg/m      Assessment & Plan:  Stacey [name] comes in today with chief complaint of Diabetes   Diagnosis and orders addressed:  1. Primary hypertension - CMP14+EGFR - CBC with Differential/Platelet  2. Gastroesophageal reflux disease with esophagitis, unspecified whether hemorrhage - CMP14+EGFR - CBC with Differential/Platelet  3. Type 2 diabetes mellitus without complication, without long-term current use of insulin (HCC) - CMP14+EGFR - CBC with Differential/Platelet  4. Primary osteoarthritis involving multiple joints -Will start Mobic today  No other NSIAD"s  - CMP14+EGFR - CBC with Differential/Platelet - meloxicam (MOBIC) 7.5 MG tablet; Take 1 tablet (7.5 mg total) by mouth daily.  Dispense: 90 tablet; Refill: 1  5. Vitamin D deficiency - CMP14+EGFR - CBC with Differential/Platelet  6. GAD (generalized anxiety disorder) - CMP14+EGFR - CBC with Differential/Platelet  7. Hyperlipidemia, unspecified hyperlipidemia type - CMP14+EGFR - CBC with Differential/Platelet  8. Morbid obesity ([place]) - CMP14+EGFR - CBC with Differential/Platelet  9. Chronic bilateral low back pain with right-sided sciatica - CMP14+EGFR - CBC with Differential/Platelet   Labs pending Health Maintenance reviewed Diet and exercise encouraged  Follow up plan: 6 months    Evelina Dun, FNP

## 2020-12-01 NOTE — Patient Instructions (Signed)
Osteoarthritis  Osteoarthritis is a type of arthritis. It refers to joint pain or joint disease. Osteoarthritis affects tissue that covers the ends of bones in joints (cartilage). Cartilage acts as a cushion between the bones and helps them move smoothly. Osteoarthritis occurs when cartilage in the joints gets worn down. Osteoarthritis is sometimes called "wear and tear" arthritis. Osteoarthritis is the most common form of arthritis. It often occurs in older people. It is a condition that gets worse over time. The joints most often affected by this condition are in the fingers, toes, hips, knees, and spine, including the neck and lower back. What are the causes? This condition is caused by the wearing down of cartilage that covers the ends of bones. What increases the risk? The following factors may make you more likely to develop this condition:  Being age 50 or older.  Obesity.  Overuse of joints.  Past injury of a joint.  Past surgery on a joint.  Family history of osteoarthritis. What are the signs or symptoms? The main symptoms of this condition are pain, swelling, and stiffness in the joint. Other symptoms may include:  An enlarged joint.  More pain and further damage caused by small pieces of bone or cartilage that break off and float inside of the joint.  Small deposits of bone (osteophytes) that grow on the edges of the joint.  A grating or scraping feeling inside the joint when you move it.  Popping or creaking sounds when you move.  Difficulty walking or exercising.  An inability to grip items, twist your hand(s), or control the movements of your hands and fingers. How is this diagnosed? This condition may be diagnosed based on:  Your medical history.  A physical exam.  Your symptoms.  X-rays of the affected joint(s).  Blood tests to rule out other types of arthritis. How is this treated? There is no cure for this condition, but treatment can help control  pain and improve joint function. Treatment may include a combination of therapies, such as:  Pain relief techniques, such as: ? Applying heat and cold to the joint. ? Massage. ? A form of talk therapy called cognitive behavioral therapy (CBT). This therapy helps you set goals and follow up on the changes that you make.  Medicines for pain and inflammation. The medicines can be taken by mouth or applied to the skin. They include: ? NSAIDs, such as ibuprofen. ? Prescription medicines. ? Strong anti-inflammatory medicines (corticosteroids). ? Certain nutritional supplements.  A prescribed exercise program. You may work with a physical therapist.  Assistive devices, such as a brace, wrap, splint, specialized glove, or cane.  A weight control plan.  Surgery, such as: ? An osteotomy. This is done to reposition the bones and relieve pain or to remove loose pieces of bone and cartilage. ? Joint replacement surgery. You may need this surgery if you have advanced osteoarthritis. Follow these instructions at home: Activity  Rest your affected joints as told by your health care provider.  Exercise as told by your health care provider. He or she may recommend specific types of exercise, such as: ? Strengthening exercises. These are done to strengthen the muscles that support joints affected by arthritis. ? Aerobic activities. These are exercises, such as brisk walking or water aerobics, that increase your heart rate. ? Range-of-motion activities. These help your joints move more easily. ? Balance and agility exercises. Managing pain, stiffness, and swelling  If directed, apply heat to the affected area as often   as told by your health care provider. Use the heat source that your health care provider recommends, such as a moist heat pack or a heating pad. ? If you have a removable assistive device, remove it as told by your health care provider. ? Place a towel between your skin and the heat  source. If your health care provider tells you to keep the assistive device on while you apply heat, place a towel between the assistive device and the heat source. ? Leave the heat on for 20-30 minutes. ? Remove the heat if your skin turns bright red. This is especially important if you are unable to feel pain, heat, or cold. You may have a greater risk of getting burned.  If directed, put ice on the affected area. To do this: ? If you have a removable assistive device, remove it as told by your health care provider. ? Put ice in a plastic bag. ? Place a towel between your skin and the bag. If your health care provider tells you to keep the assistive device on during icing, place a towel between the assistive device and the bag. ? Leave the ice on for 20 minutes, 2-3 times a day. ? Move your fingers or toes often to reduce stiffness and swelling. ? Raise (elevate) the injured area above the level of your heart while you are sitting or lying down.      General instructions  Take over-the-counter and prescription medicines only as told by your health care provider.  Maintain a healthy weight. Follow instructions from your health care provider for weight control.  Do not use any products that contain nicotine or tobacco, such as cigarettes, e-cigarettes, and chewing tobacco. If you need help quitting, ask your health care provider.  Use assistive devices as told by your health care provider.  Keep all follow-up visits as told by your health care provider. This is important. Where to find more information  National Institute of Arthritis and Musculoskeletal and Skin Diseases: www.niams.nih.gov  National Institute on Aging: www.nia.nih.gov  American College of Rheumatology: www.rheumatology.org Contact a health care provider if:  You have redness, swelling, or a feeling of warmth in a joint that gets worse.  You have a fever along with joint or muscle aches.  You develop a  rash.  You have trouble doing your normal activities. Get help right away if:  You have pain that gets worse and is not relieved by pain medicine. Summary  Osteoarthritis is a type of arthritis that affects tissue covering the ends of bones in joints (cartilage).  This condition is caused by the wearing down of cartilage that covers the ends of bones.  The main symptom of this condition is pain, swelling, and stiffness in the joint.  There is no cure for this condition, but treatment can help control pain and improve joint function. This information is not intended to replace advice given to you by your health care provider. Make sure you discuss any questions you have with your health care provider. Document Revised: 09/20/2019 Document Reviewed: 09/20/2019 Elsevier Patient Education  2021 Elsevier Inc.  

## 2020-12-02 LAB — CMP14+EGFR
ALT: 18 IU/L (ref 0–32)
AST: 21 IU/L (ref 0–40)
Albumin/Globulin Ratio: 1.7 (ref 1.2–2.2)
Albumin: 4.5 g/dL (ref 3.7–4.7)
Alkaline Phosphatase: 78 IU/L (ref 44–121)
BUN/Creatinine Ratio: 23 (ref 12–28)
BUN: 21 mg/dL (ref 8–27)
Bilirubin Total: <0.2 mg/dL (ref 0.0–1.2)
CO2: 25 mmol/L (ref 20–29)
Calcium: 9.8 mg/dL (ref 8.7–10.3)
Chloride: 97 mmol/L (ref 96–106)
Creatinine, Ser: 0.92 mg/dL (ref 0.57–1.00)
GFR calc Af Amer: 69 mL/min/{1.73_m2} (ref >59)
GFR calc non Af Amer: 60 mL/min/{1.73_m2} (ref >59)
Globulin, Total: 2.7 g/dL (ref 1.5–4.5)
Glucose: 112 mg/dL — ABNORMAL HIGH (ref 65–99)
Potassium: 4.1 mmol/L (ref 3.5–5.2)
Sodium: 140 mmol/L (ref 134–144)
Total Protein: 7.2 g/dL (ref 6.0–8.5)

## 2020-12-02 LAB — CBC WITH DIFFERENTIAL/PLATELET
Basophils Absolute: 0.1 10*3/uL (ref 0.0–0.2)
Basos: 1 %
EOS (ABSOLUTE): 0.2 10*3/uL (ref 0.0–0.4)
Eos: 2 %
Hematocrit: 37.8 % (ref 34.0–46.6)
Hemoglobin: 12.7 g/dL (ref 11.1–15.9)
Immature Grans (Abs): 0 10*3/uL (ref 0.0–0.1)
Immature Granulocytes: 0 %
Lymphocytes Absolute: 3.2 10*3/uL — ABNORMAL HIGH (ref 0.7–3.1)
Lymphs: 41 %
MCH: 30 pg (ref 26.6–33.0)
MCHC: 33.6 g/dL (ref 31.5–35.7)
MCV: 89 fL (ref 79–97)
Monocytes Absolute: 0.5 10*3/uL (ref 0.1–0.9)
Monocytes: 6 %
Neutrophils Absolute: 3.9 10*3/uL (ref 1.4–7.0)
Neutrophils: 50 %
Platelets: 234 10*3/uL (ref 150–450)
RBC: 4.24 x10E6/uL (ref 3.77–5.28)
RDW: 12.1 % (ref 11.7–15.4)
WBC: 7.8 10*3/uL (ref 3.4–10.8)

## 2020-12-18 ENCOUNTER — Other Ambulatory Visit: Payer: Self-pay | Admitting: Family

## 2021-03-01 ENCOUNTER — Telehealth: Payer: Self-pay | Admitting: Family

## 2021-03-01 ENCOUNTER — Ambulatory Visit (INDEPENDENT_AMBULATORY_CARE_PROVIDER_SITE_OTHER): Payer: Medicare Other | Admitting: Family

## 2021-03-01 ENCOUNTER — Encounter: Payer: Self-pay | Admitting: Family

## 2021-03-01 DIAGNOSIS — J019 Acute sinusitis, unspecified: Secondary | ICD-10-CM

## 2021-03-01 MED ORDER — AMOXICILLIN-POT CLAVULANATE 875-125 MG PO TABS: 1.0000 | ORAL_TABLET | Freq: Two times a day (BID) | ORAL | 0 refills | Status: DC

## 2021-03-01 NOTE — Progress Notes (Signed)
   Virtual Visit  Note Due to COVID-19 pandemic this visit was conducted virtually. This visit type was conducted due to national recommendations for restrictions regarding the COVID-19 Pandemic (e.g. social distancing, sheltering in place) in an effort to limit this patient's exposure and mitigate transmission in our community. All issues noted in this document were discussed and addressed.  A physical exam was not performed with this format.  I connected with Stacey [name] on 03/01/21 at 12:42 pm  by telephone and verified that I am speaking with the correct person using two identifiers. Stacey [name] is currently located at home and no one is currently with her during visit. The provider, Jannifer Rodney, FNP is located in their office at time of visit.  I discussed the limitations, risks, security and privacy concerns of performing an evaluation and management service by telephone and the availability of in person appointments. I also discussed with the patient that there may be a patient responsible charge related to this service. The patient expressed understanding and agreed to proceed.   History and Present Illness:  Sinusitis This is a new problem. The current episode started 1 to 4 weeks ago. The problem has been gradually worsening since onset. There has been no fever. Her pain is at a severity of 10/10. The pain is moderate. Associated symptoms include congestion, coughing, ear pain, headaches, sinus pressure and sneezing. Pertinent negatives include no hoarse voice or sore throat. Past treatments include acetaminophen and oral decongestants. The treatment provided mild relief.      Review of Systems  HENT: Positive for congestion, ear pain, sinus pressure and sneezing. Negative for hoarse voice and sore throat.   Respiratory: Positive for cough.   Neurological: Positive for headaches.  All other systems reviewed and are negative.    Observations/Objective: No SOB or  distress note, hoarse voice   Assessment and Plan: 1. Acute sinusitis, recurrence not specified, unspecified location - Take meds as prescribed - Use a cool mist humidifier  -Use saline nose sprays frequently -Force fluids -For any cough or congestion  Use plain Mucinex- regular strength or max strength is fine -For fever or aces or pains- take tylenol or ibuprofen. -Throat lozenges if help -RTO if symptoms worsen or do not improve  - amoxicillin-clavulanate (AUGMENTIN) 875-125 MG tablet; Take 1 tablet by mouth 2 (two) times daily.  Dispense: 14 tablet; Refill: 0    I discussed the assessment and treatment plan with the patient. The patient was provided an opportunity to ask questions and all were answered. The patient agreed with the plan and demonstrated an understanding of the instructions.   The patient was advised to call back or seek an in-person evaluation if the symptoms worsen or if the condition fails to improve as anticipated.  The above assessment and management plan was discussed with the patient. The patient verbalized understanding of and has agreed to the management plan. Patient is aware to call the clinic if symptoms persist or worsen. Patient is aware when to return to the clinic for a follow-up visit. Patient educated on when it is appropriate to go to the emergency department.   Time call ended:  12:53 pm   I provided 11 minutes of  non face-to-face time during this encounter.    Jannifer Rodney, FNP

## 2021-03-01 NOTE — Telephone Encounter (Signed)
Do you know anything about this? 

## 2021-03-02 MED ORDER — FLUCONAZOLE 150 MG PO TABS
150.0000 mg | ORAL_TABLET | ORAL | 0 refills | Status: DC | PRN
Start: 2021-03-02 — End: 2021-05-31

## 2021-03-02 NOTE — Telephone Encounter (Signed)
Prescription sent to pharmacy.

## 2021-03-19 ENCOUNTER — Other Ambulatory Visit: Payer: Self-pay | Admitting: Family

## 2021-03-19 DIAGNOSIS — I10 Essential (primary) hypertension: Secondary | ICD-10-CM

## 2021-05-10 ENCOUNTER — Ambulatory Visit (INDEPENDENT_AMBULATORY_CARE_PROVIDER_SITE_OTHER): Payer: Medicare Other

## 2021-05-10 VITALS — Ht 63.0 in | Wt 178.0 lb

## 2021-05-10 DIAGNOSIS — Z0000 Encounter for general adult medical examination without abnormal findings: Secondary | ICD-10-CM | POA: Diagnosis not present

## 2021-05-10 NOTE — Progress Notes (Signed)
Subjective:   Stacey [name] is a 80 y.o. female who presents for an Initial Medicare Annual Wellness Visit.  Virtual Visit via Telephone Note  I connected with  Stacey [name] on 05/10/21 at  3:30 PM EDT by telephone and verified that I am speaking with the correct person using two identifiers.  Location: Patient: Home Provider: [facility] Persons participating in the virtual visit: patient/Nurse Health Advisor   I discussed the limitations, risks, security and privacy concerns of performing an evaluation and management service by telephone and the availability of in person appointments. The patient expressed understanding and agreed to proceed.  Interactive audio and video telecommunications were attempted between this nurse and patient, however failed, due to patient having technical difficulties OR patient did not have access to video capability.  We continued and completed visit with audio only.  Some vital signs may be absent or patient reported.   Freja Faro E Artha Chiasson, LPN   Review of Systems     Cardiac Risk Factors include: advanced age (>58men, >74 women);dyslipidemia;obesity (BMI >30kg/m2);sedentary lifestyle     Objective:    Today's Vitals   05/10/21 1547  Weight: 178 lb (80.7 kg)  Height: 5\' 3"  (1.6 m)  PainSc: 10-Worst pain ever   Body mass index is 31.53 kg/m.  Advanced Directives 05/10/2021 05/27/2013 09/25/2011  Does Patient Have a Medical Advance Directive? No Patient does not have advance directive;Patient would not like information Patient does not have advance directive;Patient would not like information  Would patient like information on creating a medical advance directive? No - Patient declined - -    Current Medications (verified) Outpatient Encounter Medications as of 05/10/2021  Medication Sig   atorvastatin (LIPITOR) 20 MG tablet Take 1 tablet (20 mg total) by mouth daily.   fish oil-omega-3 fatty acids 1000 MG capsule Take 1 g by mouth 2 (two) times  daily.   hydrochlorothiazide (HYDRODIURIL) 25 MG tablet Take 1 tablet by mouth once daily   loratadine (CLARITIN) 10 MG tablet Take 10 mg by mouth daily.   Multiple Vitamin (MULITIVITAMIN WITH MINERALS) TABS Take 1 tablet by mouth daily.   potassium chloride (KLOR-CON) 10 MEQ tablet Take 1 tablet by mouth twice daily   amoxicillin-clavulanate (AUGMENTIN) 875-125 MG tablet Take 1 tablet by mouth 2 (two) times daily. (Patient not taking: Reported on 05/10/2021)   fluconazole (DIFLUCAN) 150 MG tablet Take 1 tablet (150 mg total) by mouth every three (3) days as needed. (Patient not taking: Reported on 05/10/2021)   meloxicam (MOBIC) 7.5 MG tablet Take 1 tablet (7.5 mg total) by mouth daily. (Patient not taking: Reported on 05/10/2021)   No facility-administered encounter medications on file as of 05/10/2021.    Allergies (verified) Clindamycin/lincomycin, Colchicine, Other, Phenergan [promethazine hcl], and Statins   History: Past Medical History:  Diagnosis Date   Arthritis    "knees" (05/27/2013)   Bowel obstruction (HCC)    Chronic lower back pain    "bulging discs" (05/27/2013)   GERD (gastroesophageal reflux disease)    Headache(784.0)    "maybe once/wk' (05/27/2013)   Hyperlipidemia    Hypertension    Past Surgical History:  Procedure Laterality Date   ABDOMINAL HYSTERECTOMY  1980's   APPENDECTOMY  1980's   BILATERAL OOPHORECTOMY Bilateral 2000   COLON SURGERY  10/09/2003   small bowel resection/notes 10/09/2003 (05/27/2013)   INGUINAL HERNIA REPAIR  10/09/2003; 2006   Laparotomy with extensive lysis of adhesions,/notes 10/09/2003 (05/27/2013);    Family History  Problem Relation Age  of Onset   Heart disease Mother    Stroke Father    Hypertension Sister    Hypertension Brother    Hypertension Sister    Hypertension Sister    Hypertension Brother    Hypertension Brother    Social History   Socioeconomic History   Marital status: Widowed    Spouse name: Not on file   Number of  children: 2   Years of education: Not on file   Highest education level: Not on file  Occupational History   Occupation: retired  Tobacco Use   Smoking status: Former    Packs/day: 0.25    Years: 25.00    Pack years: 6.25    Types: Cigarettes   Smokeless tobacco: Never  Vaping Use   Vaping Use: Never used  Substance and Sexual Activity   Alcohol use: No   Drug use: No   Sexual activity: Never  Other Topics Concern   Not on file  Social History Narrative   [place] ground level apartment. Her 2 sons live in [place]   Social Determinants of Health   Financial Resource Strain: Low Risk    Difficulty of Paying Living Expenses: Not hard at all  Food Insecurity: No Food Insecurity   Worried About Programme researcher, broadcasting/film/videounning Out of Food in the Last Year: Never true   Baristaan Out of Food in the Last Year: Never true  Transportation Needs: No Transportation Needs   Lack of Transportation (Medical): No   Lack of Transportation (Non-Medical): No  Physical Activity: Inactive   Days of Exercise per Week: 0 days   Minutes of Exercise per Session: 0 min  Stress: No Stress Concern Present   Feeling of Stress : Only a little  Social Connections: Socially Isolated   Frequency of Communication with Friends and Family: More than three times a week   Frequency of Social Gatherings with Friends and Family: Twice a week   Attends Religious Services: Never   Database administratorActive Member of Clubs or Organizations: No   Attends BankerClub or Organization Meetings: Never   Marital Status: Widowed    Tobacco Counseling Counseling given: Not Answered   Clinical Intake:  Pre-visit preparation completed: Yes  Pain : 0-10 Pain Score: 10-Worst pain ever Pain Type: Chronic pain Pain Location: Knee Pain Orientation: Right, Left Pain Descriptors / Indicators: Burning, Aching, Sharp, Sore Pain Onset: More than a month ago Pain Frequency: Intermittent     BMI - recorded: 31.53 Nutritional Status: BMI > 30   Obese Nutritional Risks: None Diabetes: No  How often do you need to have someone help you when you read instructions, pamphlets, or other written materials from your doctor or pharmacy?: 1 - Never  Diabetic? No  Interpreter Needed?: No  Information entered by :: Eileen Croswell, LPN   Activities of Daily Living In your present state of health, do you have any difficulty performing the following activities: 05/10/2021  Hearing? N  Vision? N  Difficulty concentrating or making decisions? N  Walking or climbing stairs? Y  Comment knees swell and hurt  Dressing or bathing? N  Doing errands, shopping? N  Preparing Food and eating ? N  Using the Toilet? N  In the past six months, have you accidently leaked urine? N  Do you have problems with loss of bowel control? N  Managing your Medications? N  Managing your Finances? N  Housekeeping or managing your Housekeeping? N  Some recent data might be hidden  Patient Care Team: [name], FNP as PCP - General (Nurse Practitioner)  Indicate any recent Medical Services you may have received from other than [facility] providers in the past year (date may be approximate).     Assessment:   This is a routine wellness examination for Stacey [name].  Hearing/Vision screen Hearing Screening - Comments:: Denies hearing difficulties Vision Screening - Comments:: Wears eyeglasses - hasn't seen an eye doctor in years  Dietary issues and exercise activities discussed: Current Exercise Habits: The patient does not participate in regular exercise at present, Type of exercise: walking, Time (Minutes): 10, Frequency (Times/Week): 7, Weekly Exercise (Minutes/Week): 70, Intensity: Mild, Exercise limited by: orthopedic condition(s)   Goals Addressed             This Visit's Progress    Prevent falls         Depression Screen PHQ 2/9 Scores 05/10/2021 12/01/2020 05/23/2020 09/16/2019 10/13/2018 09/07/2018 07/09/2018  PHQ - 2 Score 0 0 0 4 0 0 0  PHQ- 9  Score - - - 12 - - -    Fall Risk Fall Risk  05/10/2021 12/01/2020 05/23/2020 09/16/2019 10/13/2018  Falls in the past year? 0 0 0 0 0  Number falls in past yr: 0 - - - -  Injury with Fall? 0 - - - -  Comment - - - - -  Risk for fall due to : Orthopedic patient;Impaired vision - - - -  Follow up Education provided;Falls prevention discussed - - - -    FALL RISK PREVENTION PERTAINING TO THE HOME:  Any stairs in or around the home? No  If so, are there any without handrails? No  Home free of loose throw rugs in walkways, pet beds, electrical cords, etc? Yes  Adequate lighting in your home to reduce risk of falls? Yes   ASSISTIVE DEVICES UTILIZED TO PREVENT FALLS:  Life alert? No  Use of a cane, walker or w/c? No  Grab bars in the bathroom? Yes  Shower chair or bench in shower? Yes  Elevated toilet seat or a handicapped toilet? Yes   TIMED UP AND GO:  Was the test performed? No . Telephonic visit.  Cognitive Function: Normal cognitive status assessed by direct observation by this Nurse Health Advisor. No abnormalities found.      6CIT Screen 05/10/2021  What Year? 0 points  What month? 0 points  What time? 0 points  Count back from 20 0 points  Months in reverse 0 points  Repeat phrase 0 points  Total Score 0    Immunizations Immunization History  Administered Date(s) Administered   Fluad Quad(high Dose 65+) 07/08/2019   Influenza, High Dose Seasonal PF 07/02/2016, 07/29/2017   Influenza,inj,Quad PF,6+ Mos 07/04/2015   Influenza-Unspecified 07/07/2013, 07/14/2014, 07/26/2018   Moderna Sars-Covid-2 Vaccination 01/05/2020, 01/31/2020, 08/22/2020   Pneumococcal Conjugate-13 07/04/2015   Pneumococcal Polysaccharide-23 02/06/2017    TDAP status: Due, Education has been provided regarding the importance of this vaccine. Advised may receive this vaccine at local pharmacy or Health Dept. Aware to provide a copy of the vaccination record if obtained from local pharmacy or Health  Dept. Verbalized acceptance and understanding.  Flu Vaccine status: Up to date  Pneumococcal vaccine status: Up to date  Covid-19 vaccine status: Completed vaccines  Qualifies for Shingles Vaccine? Yes   Zostavax completed No   Shingrix Completed?: No.    Education has been provided regarding the importance of this vaccine. Patient has been advised to call insurance company  to determine out of pocket expense if they have not yet received this vaccine. Advised may also receive vaccine at local pharmacy or Health Dept. Verbalized acceptance and understanding.  Screening Tests Health Maintenance  Topic Date Due   OPHTHALMOLOGY EXAM  Never done   URINE MICROALBUMIN  Never done   Zoster Vaccines- Shingrix (1 of 2) Never done   HEMOGLOBIN A1C  12/01/2020   COVID-19 Vaccine (4 - Booster for Moderna series) 12/20/2020   INFLUENZA VACCINE  05/07/2021   DEXA SCAN  05/23/2021 (Originally 06/16/2007)   TETANUS/TDAP  05/23/2021 (Originally 06/15/1960)   FOOT EXAM  05/31/2021   Hepatitis C Screening  Completed   PNA vac Low Risk Adult  Completed   HPV VACCINES  Aged Out    Health Maintenance  Health Maintenance Due  Topic Date Due   OPHTHALMOLOGY EXAM  Never done   URINE MICROALBUMIN  Never done   Zoster Vaccines- Shingrix (1 of 2) Never done   HEMOGLOBIN A1C  12/01/2020   COVID-19 Vaccine (4 - Booster for Moderna series) 12/20/2020   INFLUENZA VACCINE  05/07/2021    Colorectal cancer screening: No longer required.   Mammogram status: No longer required due to age.  Bone Density Scan - patient declines  Lung Cancer Screening: (Low Dose CT Chest recommended if Age 52-80 years, 30 pack-year currently smoking OR have quit w/in 15years.) does not qualify.   Additional Screening:  Hepatitis C Screening: does qualify; Completed 05/31/20  Vision Screening: Recommended annual ophthalmology exams for early detection of glaucoma and other disorders of the eye. Is the patient up to date with  their annual eye exam?  No  Who is the provider or what is the name of the office in which the patient attends annual eye exams? none If pt is not established with a provider, would they like to be referred to a provider to establish care? No .   Dental Screening: Recommended annual dental exams for proper oral hygiene  Community Resource Referral / Chronic Care Management: CRR required this visit?  No   CCM required this visit?  No      Plan:     I have personally reviewed and noted the following in the patient's chart:   Medical and social history Use of alcohol, tobacco or illicit drugs  Current medications and supplements including opioid prescriptions. Patient is not currently taking opioid prescriptions. Functional ability and status Nutritional status Physical activity Advanced directives List of other physicians Hospitalizations, surgeries, and ER visits in previous 12 months Vitals Screenings to include cognitive, depression, and falls Referrals and appointments  In addition, I have reviewed and discussed with patient certain preventive protocols, quality metrics, and best practice recommendations. A written personalized care plan for preventive services as well as general preventive health recommendations were provided to patient.     [place] Constable, LPN   02/05/7615   Nurse Notes: None

## 2021-05-10 NOTE — Patient Instructions (Signed)
[name] , Thank you for taking time to come for your Medicare Wellness Visit. I appreciate your ongoing commitment to your health goals. Please review the following plan we discussed and let me know if I can assist you in the future.   Screening recommendations/referrals: Colonoscopy: Declined Mammogram: Declined - recommended every year Bone Density: Declined - recommended every 2 years Recommended yearly ophthalmology/optometry visit for glaucoma screening and checkup Recommended yearly dental visit for hygiene and checkup  Vaccinations: Influenza vaccine: Done 08/2020 - Repeat annually  Pneumococcal vaccine: Done 07/04/2015 7 02/06/2017 Tdap vaccine: Due (recommended every 10 years) Shingles vaccine: Due. Shingrix discussed. Please contact your pharmacy for coverage information.  (2 doses 2-6 months apart, over 90% effective)   Covid-19: Done 01/05/20, 01/31/20, 08/22/2020  Advanced directives: Advance directive discussed with you today. Even though you declined this today, please call our office should you change your mind, and we can give you the proper paperwork for you to fill out.   Conditions/risks identified: Try to increase physical activity in small increments until you are able to do 30 minutes per day. Use a cane for added stability when your knees bother you.  Next appointment: Follow up in one year for your annual wellness visit    Preventive Care 65 Years and Older, Female Preventive care refers to lifestyle choices and visits with your health care provider that can promote health and wellness. What does preventive care include? A yearly physical exam. This is also called an annual well check. Dental exams once or twice a year. Routine eye exams. Ask your health care provider how often you should have your eyes checked. Personal lifestyle choices, including: Daily care of your teeth and gums. Regular physical activity. Eating a healthy diet. Avoiding tobacco and drug  use. Limiting alcohol use. Practicing safe sex. Taking low-dose aspirin every day. Taking vitamin and mineral supplements as recommended by your health care provider. What happens during an annual well check? The services and screenings done by your health care provider during your annual well check will depend on your age, overall health, lifestyle risk factors, and family history of disease. Counseling  Your health care provider may ask you questions about your: Alcohol use. Tobacco use. Drug use. Emotional well-being. Home and relationship well-being. Sexual activity. Eating habits. History of falls. Memory and ability to understand (cognition). Work and work Astronomer. Reproductive health. Screening  You may have the following tests or measurements: Height, weight, and BMI. Blood pressure. Lipid and cholesterol levels. These may be checked every 5 years, or more frequently if you are over 83 years old. Skin check. Lung cancer screening. You may have this screening every year starting at age 57 if you have a 30-pack-year history of smoking and currently smoke or have quit within the past 15 years. Fecal occult blood test (FOBT) of the stool. You may have this test every year starting at age 19. Flexible sigmoidoscopy or colonoscopy. You may have a sigmoidoscopy every 5 years or a colonoscopy every 10 years starting at age 21. Hepatitis C blood test. Hepatitis B blood test. Sexually transmitted disease (STD) testing. Diabetes screening. This is done by checking your blood sugar (glucose) after you have not eaten for a while (fasting). You may have this done every 1-3 years. Bone density scan. This is done to screen for osteoporosis. You may have this done starting at age 91. Mammogram. This may be done every 1-2 years. Talk to your health care provider about how often  you should have regular mammograms. Talk with your health care provider about your test results, treatment  options, and if necessary, the need for more tests. Vaccines  Your health care provider may recommend certain vaccines, such as: Influenza vaccine. This is recommended every year. Tetanus, diphtheria, and acellular pertussis (Tdap, Td) vaccine. You may need a Td booster every 10 years. Zoster vaccine. You may need this after age 62. Pneumococcal 13-valent conjugate (PCV13) vaccine. One dose is recommended after age 19. Pneumococcal polysaccharide (PPSV23) vaccine. One dose is recommended after age 47. Talk to your health care provider about which screenings and vaccines you need and how often you need them. This information is not intended to replace advice given to you by your health care provider. Make sure you discuss any questions you have with your health care provider. Document Released: 10/20/2015 Document Revised: 06/12/2016 Document Reviewed: 07/25/2015 Elsevier Interactive Patient Education  2017 [place] Prevention in the Home Falls can cause injuries. They can happen to people of all ages. There are many things you can do to make your home safe and to help prevent falls. What can I do on the outside of my home? Regularly fix the edges of walkways and driveways and fix any cracks. Remove anything that might make you trip as you walk through a door, such as a raised step or threshold. Trim any bushes or trees on the path to your home. Use bright outdoor lighting. Clear any walking paths of anything that might make someone trip, such as rocks or tools. Regularly check to see if handrails are loose or broken. Make sure that both sides of any steps have handrails. Any raised decks and porches should have guardrails on the edges. Have any leaves, snow, or ice cleared regularly. Use sand or salt on walking paths during winter. Clean up any spills in your garage right away. This includes oil or grease spills. What can I do in the bathroom? Use night lights. Install grab  bars by the toilet and in the tub and shower. Do not use towel bars as grab bars. Use non-skid mats or decals in the tub or shower. If you need to sit down in the shower, use a plastic, non-slip stool. Keep the floor dry. Clean up any water that spills on the floor as soon as it happens. Remove soap buildup in the tub or shower regularly. Attach bath mats securely with double-sided non-slip rug tape. Do not have throw rugs and other things on the floor that can make you trip. What can I do in the bedroom? Use night lights. Make sure that you have a light by your bed that is easy to reach. Do not use any sheets or blankets that are too big for your bed. They should not hang down onto the floor. Have a firm chair that has side arms. You can use this for support while you get dressed. Do not have throw rugs and other things on the floor that can make you trip. What can I do in the kitchen? Clean up any spills right away. Avoid walking on wet floors. Keep items that you use a lot in easy-to-reach places. If you need to reach something above you, use a strong step stool that has a grab bar. Keep electrical cords out of the way. Do not use floor polish or wax that makes floors slippery. If you must use wax, use non-skid floor wax. Do not have throw rugs and other things on  the floor that can make you trip. What can I do with my stairs? Do not leave any items on the stairs. Make sure that there are handrails on both sides of the stairs and use them. Fix handrails that are broken or loose. Make sure that handrails are as long as the stairways. Check any carpeting to make sure that it is firmly attached to the stairs. Fix any carpet that is loose or worn. Avoid having throw rugs at the top or bottom of the stairs. If you do have throw rugs, attach them to the floor with carpet tape. Make sure that you have a light switch at the top of the stairs and the bottom of the stairs. If you do not have them,  ask someone to add them for you. What else can I do to help prevent falls? Wear shoes that: Do not have high heels. Have rubber bottoms. Are comfortable and fit you well. Are closed at the toe. Do not wear sandals. If you use a stepladder: Make sure that it is fully opened. Do not climb a closed stepladder. Make sure that both sides of the stepladder are locked into place. Ask someone to hold it for you, if possible. Clearly mark and make sure that you can see: Any grab bars or handrails. First and last steps. Where the edge of each step is. Use tools that help you move around (mobility aids) if they are needed. These include: Canes. Walkers. Scooters. Crutches. Turn on the lights when you go into a dark area. Replace any light bulbs as soon as they burn out. Set up your furniture so you have a clear path. Avoid moving your furniture around. If any of your floors are uneven, fix them. If there are any pets around you, be aware of where they are. Review your medicines with your doctor. Some medicines can make you feel dizzy. This can increase your chance of falling. Ask your doctor what other things that you can do to help prevent falls. This information is not intended to replace advice given to you by your health care provider. Make sure you discuss any questions you have with your health care provider. Document Released: 07/20/2009 Document Revised: 02/29/2016 Document Reviewed: 10/28/2014 Elsevier Interactive Patient Education  2017 [employer].

## 2021-05-31 ENCOUNTER — Encounter: Payer: Self-pay | Admitting: Family

## 2021-05-31 ENCOUNTER — Other Ambulatory Visit: Payer: Self-pay

## 2021-05-31 ENCOUNTER — Ambulatory Visit (INDEPENDENT_AMBULATORY_CARE_PROVIDER_SITE_OTHER): Payer: Medicare Other | Admitting: Family

## 2021-05-31 VITALS — BP 136/74 | HR 86 | Temp 97.5°F | Ht 63.0 in | Wt 186.0 lb

## 2021-05-31 DIAGNOSIS — M8949 Other hypertrophic osteoarthropathy, multiple sites: Secondary | ICD-10-CM

## 2021-05-31 DIAGNOSIS — R5383 Other fatigue: Secondary | ICD-10-CM | POA: Diagnosis not present

## 2021-05-31 DIAGNOSIS — I10 Essential (primary) hypertension: Secondary | ICD-10-CM | POA: Diagnosis not present

## 2021-05-31 DIAGNOSIS — G8929 Other chronic pain: Secondary | ICD-10-CM | POA: Diagnosis not present

## 2021-05-31 DIAGNOSIS — E119 Type 2 diabetes mellitus without complications: Secondary | ICD-10-CM

## 2021-05-31 DIAGNOSIS — E559 Vitamin D deficiency, unspecified: Secondary | ICD-10-CM | POA: Diagnosis not present

## 2021-05-31 DIAGNOSIS — E785 Hyperlipidemia, unspecified: Secondary | ICD-10-CM | POA: Diagnosis not present

## 2021-05-31 DIAGNOSIS — M159 Polyosteoarthritis, unspecified: Secondary | ICD-10-CM

## 2021-05-31 DIAGNOSIS — K2100 Gastro-esophageal reflux disease with esophagitis, without bleeding: Secondary | ICD-10-CM | POA: Diagnosis not present

## 2021-05-31 DIAGNOSIS — M5441 Lumbago with sciatica, right side: Secondary | ICD-10-CM | POA: Diagnosis not present

## 2021-05-31 DIAGNOSIS — F411 Generalized anxiety disorder: Secondary | ICD-10-CM | POA: Diagnosis not present

## 2021-05-31 LAB — BAYER DCA HB A1C WAIVED: HB A1C (BAYER DCA - WAIVED): 5.9 % (ref <7.0)

## 2021-05-31 MED ORDER — ATORVASTATIN CALCIUM 20 MG PO TABS: 20.0000 mg | ORAL_TABLET | Freq: Every day | ORAL | 3 refills | Status: DC

## 2021-05-31 MED ORDER — HYDROCHLOROTHIAZIDE 25 MG PO TABS: 25.0000 mg | ORAL_TABLET | Freq: Every day | ORAL | 2 refills | Status: DC

## 2021-05-31 NOTE — Patient Instructions (Signed)

## 2021-05-31 NOTE — Progress Notes (Signed)
Subjective:    Patient ID: Stacey [name], female    DOB: 1940-12-28, 80 y.o.   MRN: [id number]  Chief Complaint  Patient presents with   Medical Management of Chronic Issues       Pt presents to the office today for chronic follow up.  Hypertension This is a chronic problem. The current episode started more than 1 year ago. The problem has been resolved since onset. The problem is controlled. Associated symptoms include anxiety and peripheral edema. Pertinent negatives include no blurred vision, malaise/fatigue or shortness of breath. Risk factors for coronary artery disease include dyslipidemia, obesity and sedentary lifestyle. The current treatment provides moderate improvement.  Gastroesophageal Reflux She complains of belching and heartburn. This is a chronic problem. The current episode started more than 1 year ago. The problem occurs occasionally. Risk factors include obesity. She has tried an antacid and a diet change for the symptoms. The treatment provided moderate relief.  Diabetes She presents for her follow-up diabetic visit. She has type 2 diabetes mellitus. Hypoglycemia symptoms include nervousness/anxiousness. Pertinent negatives for diabetes include no blurred vision and no foot paresthesias. Symptoms are stable. Risk factors for coronary artery disease include dyslipidemia, diabetes mellitus, hypertension and sedentary lifestyle. (Does not check blood sugars at home) Eye exam is not current.  Arthritis Presents for follow-up visit. She complains of pain and stiffness. The symptoms have been stable. Affected locations include the left knee and right knee. Her pain is at a severity of 9/10.  Hyperlipidemia This is a chronic problem. The current episode started more than 1 year ago. Exacerbating diseases include obesity. Pertinent negatives include no shortness of breath. Current antihyperlipidemic treatment includes statins. The current treatment provides moderate improvement  of lipids. Risk factors for coronary artery disease include dyslipidemia, diabetes mellitus, hypertension and a sedentary lifestyle.  Anxiety Presents for follow-up visit. Symptoms include excessive worry and nervous/anxious behavior. Patient reports no irritability or shortness of breath. Symptoms occur rarely. The severity of symptoms is moderate.    Back Pain This is a chronic problem. The current episode started more than 1 year ago. The problem occurs intermittently. The problem has been waxing and waning since onset. The pain is present in the lumbar spine. The quality of the pain is described as aching. The pain is moderate. Risk factors include obesity. She has tried nothing for the symptoms. The treatment provided no relief.     Review of Systems  Constitutional:  Negative for irritability and malaise/fatigue.  Eyes:  Negative for blurred vision.  Respiratory:  Negative for shortness of breath.   Gastrointestinal:  Positive for heartburn.  Musculoskeletal:  Positive for arthritis, back pain and stiffness.  Psychiatric/Behavioral:  The patient is nervous/anxious.   All other systems reviewed and are negative.     Objective:   Physical Exam Vitals reviewed.  Constitutional:      General: She is not in acute distress.    Appearance: She is well-developed. She is obese.  HENT:     Head: Normocephalic and atraumatic.     Right Ear: Tympanic membrane normal.     Left Ear: Tympanic membrane normal.  Eyes:     Pupils: Pupils are equal, round, and reactive to light.  Neck:     Thyroid: No thyromegaly.  Cardiovascular:     Rate and Rhythm: Normal rate and regular rhythm.     Heart sounds: Normal heart sounds. No murmur heard. Pulmonary:     Effort: Pulmonary effort is normal. No respiratory  distress.     Breath sounds: Normal breath sounds. No wheezing.  Abdominal:     General: Bowel sounds are normal. There is no distension.     Palpations: Abdomen is soft.     Tenderness:  There is no abdominal tenderness.  Musculoskeletal:        General: No tenderness. Normal range of motion.     Cervical back: Normal range of motion and neck supple.  Skin:    General: Skin is warm and dry.  Neurological:     Mental Status: She is alert and oriented to person, place, and time.     Cranial Nerves: No cranial nerve deficit.     Deep Tendon Reflexes: Reflexes are normal and symmetric.  Psychiatric:        Behavior: Behavior normal.        Thought Content: Thought content normal.        Judgment: Judgment normal.    BP 136/74   Pulse 86   Temp (!) 97.5 F (36.4 C)   Ht _0  (1.6 m)   Wt 186 lb (84.4 kg)   SpO2 95%   BMI 32.95 kg/m      Assessment & Plan:  Stacey [name] comes in today with chief complaint of Medical Management of Chronic Issues   Diagnosis and orders addressed:  1. Type 2 diabetes mellitus without complication, without long-term current use of insulin (HCC) - CBC with Differential/Platelet - Bayer DCA Hb A1c Waived - Microalbumin / creatinine urine ratio  2. Primary hypertension - Lipid panel  3. Hyperlipidemia, unspecified hyperlipidemia type - CMP14+EGFR - atorvastatin (LIPITOR) 20 MG tablet; Take 1 tablet (20 mg total) by mouth daily.  Dispense: 90 tablet; Refill: 3  4. Fatigue, unspecified type - TSH  5. Essential hypertension - hydrochlorothiazide (HYDRODIURIL) 25 MG tablet; Take 1 tablet (25 mg total) by mouth daily.  Dispense: 90 tablet; Refill: 2  6. Gastroesophageal reflux disease with esophagitis, unspecified whether hemorrhage  7. Primary osteoarthritis involving multiple joints  8. GAD (generalized anxiety disorder)  9. Chronic bilateral low back pain with right-sided sciatica  10. Morbid obesity ([place])  11. Vitamin D deficiency   Labs pending Health Maintenance reviewed Diet and exercise encouraged  Follow up plan: 3 months    Evelina Dun, FNP

## 2021-06-01 LAB — CBC WITH DIFFERENTIAL/PLATELET
Basophils Absolute: 0.1 10*3/uL (ref 0.0–0.2)
Basos: 1 %
EOS (ABSOLUTE): 0.2 10*3/uL (ref 0.0–0.4)
Eos: 2 %
Hematocrit: 36.6 % (ref 34.0–46.6)
Hemoglobin: 12.2 g/dL (ref 11.1–15.9)
Immature Grans (Abs): 0 10*3/uL (ref 0.0–0.1)
Immature Granulocytes: 0 %
Lymphocytes Absolute: 2.8 10*3/uL (ref 0.7–3.1)
Lymphs: 35 %
MCH: 29.6 pg (ref 26.6–33.0)
MCHC: 33.3 g/dL (ref 31.5–35.7)
MCV: 89 fL (ref 79–97)
Monocytes Absolute: 0.5 10*3/uL (ref 0.1–0.9)
Monocytes: 6 %
Neutrophils Absolute: 4.4 10*3/uL (ref 1.4–7.0)
Neutrophils: 56 %
Platelets: 218 10*3/uL (ref 150–450)
RBC: 4.12 x10E6/uL (ref 3.77–5.28)
RDW: 12.3 % (ref 11.7–15.4)
WBC: 7.9 10*3/uL (ref 3.4–10.8)

## 2021-06-01 LAB — LIPID PANEL
Chol/HDL Ratio: 2.7 ratio (ref 0.0–4.4)
Cholesterol, Total: 154 mg/dL (ref 100–199)
HDL: 58 mg/dL (ref >39)
LDL Chol Calc (NIH): 74 mg/dL (ref 0–99)
Triglycerides: 123 mg/dL (ref 0–149)
VLDL Cholesterol Cal: 22 mg/dL (ref 5–40)

## 2021-06-01 LAB — CMP14+EGFR
ALT: 16 IU/L (ref 0–32)
AST: 20 IU/L (ref 0–40)
Albumin/Globulin Ratio: 2 (ref 1.2–2.2)
Albumin: 4.6 g/dL (ref 3.7–4.7)
Alkaline Phosphatase: 72 IU/L (ref 44–121)
BUN/Creatinine Ratio: 15 (ref 12–28)
BUN: 13 mg/dL (ref 8–27)
Bilirubin Total: 0.2 mg/dL (ref 0.0–1.2)
CO2: 22 mmol/L (ref 20–29)
Calcium: 10.1 mg/dL (ref 8.7–10.3)
Chloride: 101 mmol/L (ref 96–106)
Creatinine, Ser: 0.84 mg/dL (ref 0.57–1.00)
Globulin, Total: 2.3 g/dL (ref 1.5–4.5)
Glucose: 113 mg/dL — ABNORMAL HIGH (ref 65–99)
Potassium: 3.8 mmol/L (ref 3.5–5.2)
Sodium: 139 mmol/L (ref 134–144)
Total Protein: 6.9 g/dL (ref 6.0–8.5)
eGFR: 71 mL/min/{1.73_m2} (ref >59)

## 2021-06-01 LAB — MICROALBUMIN / CREATININE URINE RATIO
Creatinine, Urine: 107.4 mg/dL
Microalb/Creat Ratio: <3 mg/g creat (ref 0–29)
Microalbumin, Urine: <3 ug/mL

## 2021-06-01 LAB — TSH: TSH: 1.46 u[IU]/mL (ref 0.450–4.500)

## 2021-06-13 DIAGNOSIS — J0140 Acute pansinusitis, unspecified: Secondary | ICD-10-CM | POA: Diagnosis not present

## 2021-07-04 ENCOUNTER — Inpatient Hospital Stay (HOSPITAL_COMMUNITY)
Admission: EM | Admit: 2021-07-04 | Discharge: 2021-07-07 | DRG: 390 | Disposition: A | Discharge status: Home / Self Care | Payer: Medicare Other | Attending: Internal Medicine | Admitting: Internal Medicine

## 2021-07-04 ENCOUNTER — Emergency Department (HOSPITAL_COMMUNITY): Payer: Medicare Other

## 2021-07-04 ENCOUNTER — Observation Stay (HOSPITAL_COMMUNITY): Payer: Medicare Other

## 2021-07-04 ENCOUNTER — Other Ambulatory Visit: Payer: Self-pay

## 2021-07-04 DIAGNOSIS — E663 Overweight: Secondary | ICD-10-CM | POA: Diagnosis not present

## 2021-07-04 DIAGNOSIS — D72829 Elevated white blood cell count, unspecified: Secondary | ICD-10-CM | POA: Diagnosis present

## 2021-07-04 DIAGNOSIS — I10 Essential (primary) hypertension: Secondary | ICD-10-CM | POA: Diagnosis not present

## 2021-07-04 DIAGNOSIS — K56609 Unspecified intestinal obstruction, unspecified as to partial versus complete obstruction: Secondary | ICD-10-CM

## 2021-07-04 DIAGNOSIS — Z20822 Contact with and (suspected) exposure to covid-19: Secondary | ICD-10-CM | POA: Diagnosis not present

## 2021-07-04 DIAGNOSIS — E876 Hypokalemia: Secondary | ICD-10-CM | POA: Diagnosis present

## 2021-07-04 DIAGNOSIS — Z4682 Encounter for fitting and adjustment of non-vascular catheter: Secondary | ICD-10-CM | POA: Diagnosis not present

## 2021-07-04 DIAGNOSIS — Z8249 Family history of ischemic heart disease and other diseases of the circulatory system: Secondary | ICD-10-CM

## 2021-07-04 DIAGNOSIS — R1084 Generalized abdominal pain: Secondary | ICD-10-CM

## 2021-07-04 DIAGNOSIS — K565 Intestinal adhesions [bands], unspecified as to partial versus complete obstruction: Secondary | ICD-10-CM | POA: Diagnosis present

## 2021-07-04 DIAGNOSIS — K5651 Intestinal adhesions [bands], with partial obstruction: Secondary | ICD-10-CM | POA: Diagnosis not present

## 2021-07-04 DIAGNOSIS — Z79899 Other long term (current) drug therapy: Secondary | ICD-10-CM

## 2021-07-04 DIAGNOSIS — K219 Gastro-esophageal reflux disease without esophagitis: Secondary | ICD-10-CM | POA: Diagnosis present

## 2021-07-04 DIAGNOSIS — F411 Generalized anxiety disorder: Secondary | ICD-10-CM | POA: Diagnosis present

## 2021-07-04 DIAGNOSIS — E119 Type 2 diabetes mellitus without complications: Secondary | ICD-10-CM | POA: Diagnosis present

## 2021-07-04 DIAGNOSIS — E785 Hyperlipidemia, unspecified: Secondary | ICD-10-CM | POA: Diagnosis present

## 2021-07-04 DIAGNOSIS — R109 Unspecified abdominal pain: Secondary | ICD-10-CM | POA: Diagnosis not present

## 2021-07-04 DIAGNOSIS — Z888 Allergy status to other drugs, medicaments and biological substances status: Secondary | ICD-10-CM

## 2021-07-04 DIAGNOSIS — R7303 Prediabetes: Secondary | ICD-10-CM | POA: Insufficient documentation

## 2021-07-04 DIAGNOSIS — K5669 Other partial intestinal obstruction: Secondary | ICD-10-CM | POA: Diagnosis not present

## 2021-07-04 DIAGNOSIS — Z87891 Personal history of nicotine dependence: Secondary | ICD-10-CM

## 2021-07-04 DIAGNOSIS — Z9049 Acquired absence of other specified parts of digestive tract: Secondary | ICD-10-CM

## 2021-07-04 DIAGNOSIS — Z0189 Encounter for other specified special examinations: Secondary | ICD-10-CM

## 2021-07-04 DIAGNOSIS — Z6832 Body mass index (BMI) 32.0-32.9, adult: Secondary | ICD-10-CM

## 2021-07-04 DIAGNOSIS — Z823 Family history of stroke: Secondary | ICD-10-CM

## 2021-07-04 LAB — COMPREHENSIVE METABOLIC PANEL
ALT: 22 U/L (ref 0–44)
AST: 30 U/L (ref 15–41)
Albumin: 3.8 g/dL (ref 3.5–5.0)
Alkaline Phosphatase: 65 U/L (ref 38–126)
Anion gap: 12 (ref 5–15)
BUN: 14 mg/dL (ref 8–23)
CO2: 29 mmol/L (ref 22–32)
Calcium: 10.7 mg/dL — ABNORMAL HIGH (ref 8.9–10.3)
Chloride: 93 mmol/L — ABNORMAL LOW (ref 98–111)
Creatinine, Ser: 0.97 mg/dL (ref 0.44–1.00)
GFR, Estimated: 59 mL/min — ABNORMAL LOW (ref >60)
Glucose, Bld: 176 mg/dL — ABNORMAL HIGH (ref 70–99)
Potassium: 3.4 mmol/L — ABNORMAL LOW (ref 3.5–5.1)
Sodium: 134 mmol/L — ABNORMAL LOW (ref 135–145)
Total Bilirubin: 0.7 mg/dL (ref 0.3–1.2)
Total Protein: 7.1 g/dL (ref 6.5–8.1)

## 2021-07-04 LAB — DIFFERENTIAL
Abs Immature Granulocytes: 0.07 10*3/uL (ref 0.00–0.07)
Basophils Absolute: 0.1 10*3/uL (ref 0.0–0.1)
Basophils Relative: 0 %
Eosinophils Absolute: 0 10*3/uL (ref 0.0–0.5)
Eosinophils Relative: 0 %
Immature Granulocytes: 0 %
Lymphocytes Relative: 9 %
Lymphs Abs: 1.7 10*3/uL (ref 0.7–4.0)
Monocytes Absolute: 0.8 10*3/uL (ref 0.1–1.0)
Monocytes Relative: 4 %
Neutro Abs: 16.5 10*3/uL — ABNORMAL HIGH (ref 1.7–7.7)
Neutrophils Relative %: 87 %

## 2021-07-04 LAB — URINALYSIS, ROUTINE W REFLEX MICROSCOPIC
Bilirubin Urine: NEGATIVE
Glucose, UA: NEGATIVE mg/dL
Hgb urine dipstick: NEGATIVE
Ketones, ur: NEGATIVE mg/dL
Nitrite: NEGATIVE
Protein, ur: NEGATIVE mg/dL
Specific Gravity, Urine: 1.013 (ref 1.005–1.030)
pH: 7 (ref 5.0–8.0)

## 2021-07-04 LAB — CBC
HCT: 38.5 % (ref 36.0–46.0)
Hemoglobin: 12.3 g/dL (ref 12.0–15.0)
MCH: 29.9 pg (ref 26.0–34.0)
MCHC: 31.9 g/dL (ref 30.0–36.0)
MCV: 93.7 fL (ref 80.0–100.0)
Platelets: 240 10*3/uL (ref 150–400)
RBC: 4.11 MIL/uL (ref 3.87–5.11)
RDW: 13.5 % (ref 11.5–15.5)
WBC: 19.2 10*3/uL — ABNORMAL HIGH (ref 4.0–10.5)
nRBC: 0 % (ref 0.0–0.2)

## 2021-07-04 LAB — RESP PANEL BY RT-PCR (FLU A&B, COVID) ARPGX2
Influenza A by PCR: NEGATIVE
Influenza B by PCR: NEGATIVE
SARS Coronavirus 2 by RT PCR: NEGATIVE

## 2021-07-04 LAB — LIPASE, BLOOD: Lipase: 28 U/L (ref 11–51)

## 2021-07-04 MED ORDER — METOPROLOL TARTRATE 5 MG/5ML IV SOLN: 5.0000 mg | Freq: Four times a day (QID) | INTRAVENOUS | Status: DC | PRN

## 2021-07-04 MED ORDER — DIATRIZOATE MEGLUMINE & SODIUM 66-10 % PO SOLN
90.0000 mL | Freq: Once | ORAL | Status: AC
  Administered 2021-07-05: 90 mL via NASOGASTRIC
  Filled 2021-07-04 (×2): qty 90

## 2021-07-04 MED ORDER — ONDANSETRON HCL 4 MG/2ML IJ SOLN
4.0000 mg | Freq: Once | INTRAMUSCULAR | Status: AC
  Administered 2021-07-04: 4 mg via INTRAVENOUS
  Filled 2021-07-04: qty 2

## 2021-07-04 MED ORDER — LACTATED RINGERS IV SOLN: INTRAVENOUS | Status: DC

## 2021-07-04 MED ORDER — ONDANSETRON 4 MG PO TBDP: 4.0000 mg | ORAL_TABLET | Freq: Once | ORAL | Status: DC | PRN

## 2021-07-04 MED ORDER — SODIUM CHLORIDE 0.9 % IV BOLUS
1000.0000 mL | Freq: Once | INTRAVENOUS | Status: AC
  Administered 2021-07-04: 1000 mL via INTRAVENOUS

## 2021-07-04 MED ORDER — MORPHINE SULFATE (PF) 4 MG/ML IV SOLN
4.0000 mg | Freq: Once | INTRAVENOUS | Status: AC
Start: 2021-07-04 — End: 2021-07-04
  Administered 2021-07-04: 4 mg via INTRAVENOUS
  Filled 2021-07-04: qty 1

## 2021-07-04 MED ORDER — IOHEXOL 350 MG/ML SOLN
80.0000 mL | Freq: Once | INTRAVENOUS | Status: AC | PRN
  Administered 2021-07-04: 80 mL via INTRAVENOUS

## 2021-07-04 MED ORDER — POTASSIUM CHLORIDE 10 MEQ/100ML IV SOLN
INTRAVENOUS | Status: AC
  Filled 2021-07-04: qty 100

## 2021-07-04 MED ORDER — ONDANSETRON HCL 4 MG PO TABS: 4.0000 mg | ORAL_TABLET | Freq: Four times a day (QID) | ORAL | Status: DC | PRN

## 2021-07-04 MED ORDER — ONDANSETRON HCL 4 MG/2ML IJ SOLN: 4.0000 mg | Freq: Four times a day (QID) | INTRAMUSCULAR | Status: DC | PRN

## 2021-07-04 MED ORDER — MORPHINE SULFATE (PF) 2 MG/ML IV SOLN: 1.0000 mg | INTRAVENOUS | Status: DC | PRN

## 2021-07-04 MED ORDER — ACETAMINOPHEN 325 MG PO TABS: 650.0000 mg | ORAL_TABLET | Freq: Four times a day (QID) | ORAL | Status: DC | PRN

## 2021-07-04 MED ORDER — ACETAMINOPHEN 650 MG RE SUPP: 650.0000 mg | Freq: Four times a day (QID) | RECTAL | Status: DC | PRN

## 2021-07-04 MED ORDER — FENTANYL CITRATE PF 50 MCG/ML IJ SOSY
50.0000 ug | PREFILLED_SYRINGE | Freq: Once | INTRAMUSCULAR | Status: AC
  Administered 2021-07-04: 50 ug via INTRAVENOUS
  Filled 2021-07-04: qty 1

## 2021-07-04 MED ORDER — POTASSIUM CHLORIDE 10 MEQ/100ML IV SOLN
10.0000 meq | INTRAVENOUS | Status: AC
  Administered 2021-07-04 (×2): 10 meq via INTRAVENOUS
  Filled 2021-07-04: qty 100

## 2021-07-04 NOTE — ED Notes (Signed)
NG tube advanced 5cm per xray report.

## 2021-07-04 NOTE — ED Provider Notes (Signed)
[facility] EMERGENCY DEPARTMENT Provider Note   CSN: [id number] Arrival date & time: 07/04/21  5009     History Chief Complaint  Patient presents with   Abdominal Pain    Stacey [name] is a 80 y.o. female.  HPI     80 year old female with a history of hypertension, hyperlipidemia, diabetes, generalized anxiety disorder, bowel obstruction, hysterectomy, appendectomy, inguinal hernia repair, small bowel resection in 2005, presents with concern for abdominal pain.  Reports she has a history of small bowel obstruction and that this pain feels similar.  The pain started yesterday and is located in the epigastrium with radiation more generalized.  It is 10 out of 10 in severity.  Nothing seems to make it better or worse.  She has associated nausea and vomiting, with 6 episodes of vomiting last night.  Yesterday she had 2 bowel movements, but has not had any bowel movement since then, and reports she was not able to pass flatus overnight.  The symptoms do remind her of her prior small bowel obstruction.  She denies any urinary symptoms, fever, chest pain or shortness of breath.  Past Medical History:  Diagnosis Date   Arthritis    "knees" (05/27/2013)   Bowel obstruction (HCC)    Chronic lower back pain    "bulging discs" (05/27/2013)   GERD (gastroesophageal reflux disease)    Headache(784.0)    "maybe once/wk' (05/27/2013)   Hyperlipidemia    Hypertension     Patient Active Problem List   Diagnosis Date Noted   Small bowel obstruction due to adhesions (HCC) 07/04/2021   Leukocytosis 07/04/2021   Prediabetes 09/24/2019   GAD (generalized anxiety disorder) 09/16/2019   Osteoarthritis 08/11/2017   Back pain 08/11/2017   Hyperlipemia 02/06/2017   Morbid obesity (HCC) 05/09/2016   Metabolic syndrome 12/28/2015   Vitamin D deficiency 01/04/2015   Bilateral lower extremity edema 02/23/2014   Heart palpitations 05/27/2013   Hypertension 09/24/2011    Hypokalemia 09/24/2011   Gastroesophageal reflux disease 09/24/2011    Past Surgical History:  Procedure Laterality Date   ABDOMINAL HYSTERECTOMY  1980's   APPENDECTOMY  1980's   BILATERAL OOPHORECTOMY Bilateral 2000   COLON SURGERY  10/09/2003   small bowel resection/notes 10/09/2003 (05/27/2013)   INGUINAL HERNIA REPAIR  10/09/2003; 2006   Laparotomy with extensive lysis of adhesions,/notes 10/09/2003 (05/27/2013);      OB History   No obstetric history on file.     Family History  Problem Relation Age of Onset   Heart disease Mother    Stroke Father    Hypertension Sister    Hypertension Brother    Hypertension Sister    Hypertension Sister    Hypertension Brother    Hypertension Brother     Social History   Tobacco Use   Smoking status: Former    Packs/day: 0.25    Years: 25.00    Pack years: 6.25    Types: Cigarettes   Smokeless tobacco: Never  Vaping Use   Vaping Use: Never used  Substance Use Topics   Alcohol use: No   Drug use: No    Home Medications Prior to Admission medications   Medication Sig Start Date End Date Taking? Authorizing Provider  acetaminophen (TYLENOL) 325 MG tablet Take 650 mg by mouth every 6 (six) hours as needed for moderate pain or fever.   Yes [provider]  atorvastatin (LIPITOR) 20 MG tablet Take 1 tablet (20 mg total) by mouth daily. 05/31/21  Yes  Hawks, Christy A, FNP  fish oil-omega-3 fatty acids 1000 MG capsule Take 1 g by mouth 2 (two) times daily.   Yes [provider]  hydrochlorothiazide (HYDRODIURIL) 25 MG tablet Take 1 tablet (25 mg total) by mouth daily. 05/31/21  Yes Hawks, Christy A, FNP  loratadine (CLARITIN) 10 MG tablet Take 10 mg by mouth daily.   Yes [provider]  Multiple Vitamin (MULITIVITAMIN WITH MINERALS) TABS Take 1 tablet by mouth daily.   Yes [provider]  oxymetazoline (AFRIN) 0.05 % nasal spray Place 2 sprays into both nostrils 2 (two) times daily as needed for  congestion.   Yes [provider]    Allergies    Clindamycin/lincomycin, Colchicine, Other, Phenergan [promethazine hcl], and Statins  Review of Systems   Review of Systems  Constitutional:  Negative for fever.  Eyes:  Negative for visual disturbance.  Respiratory:  Negative for cough and shortness of breath.   Cardiovascular:  Negative for chest pain.  Gastrointestinal:  Positive for abdominal distention, abdominal pain, constipation, nausea and vomiting. Negative for diarrhea.  Genitourinary:  Negative for difficulty urinating and dysuria.  Musculoskeletal:  Negative for back pain.  Skin:  Negative for rash.  Neurological:  Negative for syncope.   Physical Exam Updated Vital Signs BP 116/64   Pulse 88   Temp 98.2 F (36.8 C) (Oral)   Resp 20   Ht 5\' 3"  (1.6 m)   Wt 84.4 kg   SpO2 95%   BMI 32.95 kg/m   Physical Exam Vitals and nursing note reviewed.  Constitutional:      General: She is not in acute distress.    Appearance: Normal appearance. She is not ill-appearing, toxic-appearing or diaphoretic.  HENT:     Head: Normocephalic.  Eyes:     Conjunctiva/sclera: Conjunctivae normal.  Cardiovascular:     Rate and Rhythm: Normal rate and regular rhythm.     Pulses: Normal pulses.  Pulmonary:     Effort: Pulmonary effort is normal. No respiratory distress.  Abdominal:     General: Abdomen is protuberant. There is distension.     Tenderness: There is generalized abdominal tenderness.  Musculoskeletal:        General: No deformity or signs of injury.     Cervical back: No rigidity.  Skin:    General: Skin is warm and dry.     Coloration: Skin is not jaundiced or pale.  Neurological:     General: No focal deficit present.     Mental Status: She is alert and oriented to person, place, and time.    ED Results / Procedures / Treatments   Labs (all labs ordered are listed, but only abnormal results are displayed) Labs Reviewed  COMPREHENSIVE METABOLIC  PANEL - Abnormal; Notable for the following components:      Result Value   Sodium 134 (*)    Potassium 3.4 (*)    Chloride 93 (*)    Glucose, Bld 176 (*)    Calcium 10.7 (*)    GFR, Estimated 59 (*)    All other components within normal limits  CBC - Abnormal; Notable for the following components:   WBC 19.2 (*)    All other components within normal limits  URINALYSIS, ROUTINE W REFLEX MICROSCOPIC - Abnormal; Notable for the following components:   APPearance CLOUDY (*)    Leukocytes,Ua TRACE (*)    Bacteria, UA RARE (*)    All other components within normal limits  DIFFERENTIAL - Abnormal;  Notable for the following components:   Neutro Abs 16.5 (*)    All other components within normal limits  RESP PANEL BY RT-PCR (FLU A&B, COVID) ARPGX2  LIPASE, BLOOD  MAGNESIUM  BASIC METABOLIC PANEL  CBC    EKG EKG Interpretation  Date/Time:  Wednesday July 04 2021 09:17:37 EDT Ventricular Rate:  90 PR Interval:  206 QRS Duration: 101 QT Interval:  395 QTC Calculation: 484 R Axis:   81 Text Interpretation: Sinus arrhythmia Ventricular premature complex Borderline right axis deviation Low voltage, precordial leads No significant change since last tracing Confirmed by Alvira Monday ([id number]) on 07/04/2021 9:41:45 AM  Radiology CT ABDOMEN PELVIS W CONTRAST  Result Date: 07/04/2021 CLINICAL DATA:  Abdominal pain bloating. History of previous bowel surgery. EXAM: CT ABDOMEN AND PELVIS WITH CONTRAST TECHNIQUE: Multidetector CT imaging of the abdomen and pelvis was performed using the standard protocol following bolus administration of intravenous contrast. CONTRAST:  39mL OMNIPAQUE IOHEXOL 350 MG/ML SOLN COMPARISON:  CT scan from 2014 FINDINGS: Lower chest: Streaky basilar scarring or atelectasis. No infiltrates or effusions. The heart is normal in size. No pericardial effusion. Aortic and coronary artery calcifications are noted. There is a small hiatal hernia and slight distal  esophageal wall thickening which could be due to reflux esophagitis. Hepatobiliary: No hepatic lesions or intrahepatic biliary dilatation. Scattered calcified granulomas are noted. Gallbladder is unremarkable. No common bile duct dilatation. Pancreas: Stable fairly marked atrophy of the pancreas but no mass, inflammation or ductal dilatation. Spleen: Normal size.  No focal lesions. Adrenals/Urinary Tract: Adrenal glands and kidneys are unremarkable. Simple right renal cyst. No ureteral or bladder calculi. The delayed images do not demonstrate any significant collecting system abnormalities. Mild stable cystocele noted. Stomach/Bowel: The stomach is unremarkable. The duodenum is normal. There are mildly dilated fluid-filled loops of small bowel with scattered air-fluid levels. The distal ileum demonstrates some apparent formed stool typically seen with an obstructive process. There are surgical changes involving the distal ileum just proximal to the terminal ileum with a dilated saclike appearance of the ileum. This appears stable. Slight narrowing of the ileal loop proximal to this area. The distal/terminal ileum is normal in caliber/decompressed. Mild mucosal enhancement is noted. There is also some inflammatory type interstitial changes around these distal loops of ileum. Findings suggest an early or partial small bowel obstruction. The colon is moderately decompressed.  Scattered diverticulosis. Vascular/Lymphatic: Stable scattered aortic and iliac artery calcifications. No aneurysm or dissection. The major venous structures are patent. No mesenteric or retroperitoneal mass or adenopathy. Reproductive: The uterus is surgically absent. The left ovary is still present and appears normal. I do not see the right ovary for certain. Other: No pelvic mass or adenopathy. No free pelvic fluid collections. No inguinal mass or adenopathy. Very small periumbilical abdominal wall hernia containing fat. Musculoskeletal: No  significant bony findings. IMPRESSION: 1. CT findings consistent with an early or partial small bowel obstruction with transition point in the distal ileum likely due to adhesions. 2. Surgical changes involving the distal ileum just proximal to the terminal ileum with a dilated saclike appearance of the ileum. This appears stable. 3. Small hiatal hernia and slight distal esophageal wall thickening which could be due to reflux esophagitis. 4. Stable small cystocele. 5. Aortic atherosclerosis. Aortic Atherosclerosis (ICD10-I70.0). Electronically Signed   By: Rudie Meyer M.D.   On: 07/04/2021 13:23   DG Abd Portable 1V-Small Bowel Protocol-Position Verification  Result Date: 07/04/2021 CLINICAL DATA:  Encounter for imaging study to confirm nasogastric (  NG) tube placement EXAM: PORTABLE ABDOMEN - 1 VIEW COMPARISON:  None. FINDINGS: Enteric tube with tip coursing below the hemidiaphragm just distal to the gastroesophageal junction. Side port overlying the expected region of the distal esophagus. Intravenous contrast excretion of the right kidney is noted. Bibasilar atelectasis of the lungs. Nonobstructive bowel gas pattern noted within the upper abdomen. IMPRESSION: Enteric tube with tip coursing below the hemidiaphragm likely just distal to the gastroesophageal junction. Side port overlying the expected region of the distal esophagus. Recommend advancing by 5 cm. Electronically Signed   By: Tish Frederickson M.D.   On: 07/04/2021 15:56    Procedures Procedures   Medications Ordered in ED Medications  diatrizoate meglumine-sodium (GASTROGRAFIN) 66-10 % solution 90 mL (90 mLs Per NG tube Not Given 07/04/21 2035)  lactated ringers infusion ( Intravenous New Bag/Given 07/04/21 1606)  ondansetron (ZOFRAN) tablet 4 mg (has no administration in time range)    Or  ondansetron (ZOFRAN) injection 4 mg (has no administration in time range)  acetaminophen (TYLENOL) tablet 650 mg (has no administration in time range)     Or  acetaminophen (TYLENOL) suppository 650 mg (has no administration in time range)  morphine 2 MG/ML injection 1 mg (has no administration in time range)  metoprolol tartrate (LOPRESSOR) injection 5 mg (has no administration in time range)  sodium chloride 0.9 % bolus 1,000 mL (0 mLs Intravenous Stopped 07/04/21 0850)  fentaNYL (SUBLIMAZE) injection 50 mcg (50 mcg Intravenous Given 07/04/21 0755)  ondansetron (ZOFRAN) injection 4 mg (4 mg Intravenous Given 07/04/21 0755)  morphine 4 MG/ML injection 4 mg (4 mg Intravenous Given 07/04/21 0919)  ondansetron (ZOFRAN) injection 4 mg (4 mg Intravenous Given 07/04/21 0919)  iohexol (OMNIPAQUE) 350 MG/ML injection 80 mL (80 mLs Intravenous Contrast Given 07/04/21 1146)  ondansetron (ZOFRAN) injection 4 mg (4 mg Intravenous Given 07/04/21 1415)  sodium chloride 0.9 % bolus 1,000 mL (0 mLs Intravenous Stopped 07/04/21 1607)  potassium chloride 10 mEq in 100 mL IVPB (10 mEq Intravenous New Bag/Given 07/04/21 2042)    ED Course  I have reviewed the triage vital signs and the nursing notes.  Pertinent labs & imaging results that were available during my care of the patient were reviewed by me and considered in my medical decision making (see chart for details).    MDM Rules/Calculators/A&P                           80 year old female with a history of hypertension, hyperlipidemia, diabetes, generalized anxiety disorder, bowel obstruction, hysterectomy, appendectomy, inguinal hernia repair, small bowel resection in 2005, presents with concern for abdominal pain. DDx includes pancreatitis, cholecystitis, pyelonephritis, nephrolithiasis, diverticulitis, AAA, SBO.  Labs show no sign of pancreatitis, no hepatitis.  Mild hypercalcemia, leukocytosis.   CT abdomen pelvis shows SBO.  Consulted General Surgery. Admitted to hospitalist for further care.    Final Clinical Impression(s) / ED Diagnoses Final diagnoses:  Generalized abdominal pain  Encounter for  imaging study to confirm nasogastric (NG) tube placement  SBO (small bowel obstruction) (HCC)    Rx / DC Orders ED Discharge Orders     None        Alvira Monday, MD 07/04/21 2207

## 2021-07-04 NOTE — H&P (Addendum)
History and Physical    Stacey [name] GMW:[id number] DOB: 05-30-1941 DOA: 07/04/2021  PCP: [name], FNP Consultants:  none  Patient coming from:  Home - lives with alone   Chief Complaint: stomach pain and vomiting.   HPI: Stacey [name] is a 80 y.o. female with medical history significant of HTN, HLD, T2DM, GERD, OA who presented to ED with worsening abdominal pain and vomiting. She states symptoms started yesterday afternoon.  She states she first had abdominal pain in mid to lower abdomen. Pain rated as a 10/10 and dull. Would radiate to bilateral hips/lower quadrants. Nothing made it better or worse. She started to have vomiting last night and had about 3 episodes. She called her son around 4Am to come and get her. She has had no fever/chills. Last BM was this AM and she states it was normal. Denies any blood in stool. Never had had a colonoscopy.   Hx of SBO in 09/2011 ago and another time before this.   Hx of hysterectomy, appendectomy, bilateral oophorectomy, small bowel resection with extensive lysis of adhesions and inguinal hernia repair  She denies any headaches, vision changes, chest pain, palpitations, shortness of breath, cough, diarrhea, dysuria. She does have sinus issues    ED Course: vitals: Afebrile, blood pressure 127/88, heart rate 77, respiratory rate 20, oxygen 96% on room air. Pertinent labs: WBC 19.2, potassium 3.4, glucose 176,  CT Abdo pelvis: Consistent with an early or partial small bowel obstruction with transition point in the distal ileum likely due to adhesions.  Patient given 2 L of normal saline bolus, Zofran and pain medication.  General surgery was consulted and TRH was asked to admit.  Review of Systems: As per HPI; otherwise review of systems reviewed and negative.   Ambulatory Status:  Ambulates without assistance   Past Medical History:  Diagnosis Date   Arthritis    "knees" (05/27/2013)   Bowel obstruction (HCC)    Chronic lower  back pain    "bulging discs" (05/27/2013)   GERD (gastroesophageal reflux disease)    Headache(784.0)    "maybe once/wk' (05/27/2013)   Hyperlipidemia    Hypertension     Past Surgical History:  Procedure Laterality Date   ABDOMINAL HYSTERECTOMY  1980's   APPENDECTOMY  1980's   BILATERAL OOPHORECTOMY Bilateral 2000   COLON SURGERY  10/09/2003   small bowel resection/notes 10/09/2003 (05/27/2013)   INGUINAL HERNIA REPAIR  10/09/2003; 2006   Laparotomy with extensive lysis of adhesions,/notes 10/09/2003 (05/27/2013);     Social History   Socioeconomic History   Marital status: Widowed    Spouse name: Not on file   Number of children: 2   Years of education: Not on file   Highest education level: Not on file  Occupational History   Occupation: retired  Tobacco Use   Smoking status: Former    Packs/day: 0.25    Years: 25.00    Pack years: 6.25    Types: Cigarettes   Smokeless tobacco: Never  Vaping Use   Vaping Use: Never used  Substance and Sexual Activity   Alcohol use: No   Drug use: No   Sexual activity: Never  Other Topics Concern   Not on file  Social History Narrative   She lives alone in [place] - ground level apartment. Her 2 sons live in [place]   Social Determinants of Health   Financial Resource Strain: Low Risk    Difficulty of Paying Living Expenses: Not hard at all  Food Insecurity: No Food Insecurity   Worried About Programme researcher, broadcasting/film/video in the Last Year: Never true   Ran Out of Food in the Last Year: Never true  Transportation Needs: No Transportation Needs   Lack of Transportation (Medical): No   Lack of Transportation (Non-Medical): No  Physical Activity: Inactive   Days of Exercise per Week: 0 days   Minutes of Exercise per Session: 0 min  Stress: No Stress Concern Present   Feeling of Stress : Only a little  Social Connections: Socially Isolated   Frequency of Communication with Friends and Family: More than three times a week   Frequency of Social  Gatherings with Friends and Family: Twice a week   Attends Religious Services: Never   Database administrator or Organizations: No   Attends Banker Meetings: Never   Marital Status: Widowed  Catering manager Violence: Not At Risk   Fear of Current or Ex-Partner: No   Emotionally Abused: No   Physically Abused: No   Sexually Abused: No    Allergies  Allergen Reactions   Clindamycin/Lincomycin Other (See Comments)    Unknown     Colchicine     Makes her to weak   Other     OMNICEF- DIARRHEA   Phenergan [Promethazine Hcl] Swelling    Mouth swelling   Statins Other (See Comments)    Weakness all over    Family History  Problem Relation Age of Onset   Heart disease Mother    Stroke Father    Hypertension Sister    Hypertension Brother    Hypertension Sister    Hypertension Sister    Hypertension Brother    Hypertension Brother     Prior to Admission medications   Medication Sig Start Date End Date Taking? Authorizing Provider  atorvastatin (LIPITOR) 20 MG tablet Take 1 tablet (20 mg total) by mouth daily. 05/31/21   [name], FNP  fish oil-omega-3 fatty acids 1000 MG capsule Take 1 g by mouth 2 (two) times daily.    [provider]  hydrochlorothiazide (HYDRODIURIL) 25 MG tablet Take 1 tablet (25 mg total) by mouth daily. 05/31/21   [name], FNP  loratadine (CLARITIN) 10 MG tablet Take 10 mg by mouth daily.    [provider]  Multiple Vitamin (MULITIVITAMIN WITH MINERALS) TABS Take 1 tablet by mouth daily.    [provider]    Physical Exam: Vitals:   07/04/21 0900 07/04/21 1115 07/04/21 1215 07/04/21 1300  BP: 134/81 115/70 121/72 (!) 109/59  Pulse: 91 92 98 71  Resp: 18 18 19 20   Temp:      SpO2: 100% 91% 95% 90%  Weight:      Height:         General:  Appears calm and comfortable and is in NAD. NG tube in place.  Eyes:  PERRL, EOMI, normal lids, iris ENT:  grossly normal hearing, lips & tongue, mmm;  appropriate dentition Neck:  no LAD, masses or thyromegaly; no carotid bruits Cardiovascular:  RRR, no m/r/g. 1-2+ pedal edema bilaterally  Respiratory:   crackle in bilateral lung fields. Good air movement throughout. Normal respiratory effort. Abdomen:  soft, mild TTP in umbilical area.  ND, decreased, but present BS.  No rebound or guarding.  Back:   normal alignment, no CVAT Skin:  no rash or induration seen on limited exam Musculoskeletal:  grossly normal tone BUE/BLE, good ROM, no bony abnormality Lower extremity:  Limited foot exam with no ulcerations.  2+ distal pulses. Psychiatric:  grossly normal mood and affect, speech fluent and appropriate, AOx3 Neurologic:  CN 2-12 grossly intact, moves all extremities in coordinated fashion, sensation intact    Radiological Exams on Admission: Independently reviewed - see discussion in A/P where applicable  CT ABDOMEN PELVIS W CONTRAST  Result Date: 07/04/2021 CLINICAL DATA:  Abdominal pain bloating. History of previous bowel surgery. EXAM: CT ABDOMEN AND PELVIS WITH CONTRAST TECHNIQUE: Multidetector CT imaging of the abdomen and pelvis was performed using the standard protocol following bolus administration of intravenous contrast. CONTRAST:  51mL OMNIPAQUE IOHEXOL 350 MG/ML SOLN COMPARISON:  CT scan from 2014 FINDINGS: Lower chest: Streaky basilar scarring or atelectasis. No infiltrates or effusions. The heart is normal in size. No pericardial effusion. Aortic and coronary artery calcifications are noted. There is a small hiatal hernia and slight distal esophageal wall thickening which could be due to reflux esophagitis. Hepatobiliary: No hepatic lesions or intrahepatic biliary dilatation. Scattered calcified granulomas are noted. Gallbladder is unremarkable. No common bile duct dilatation. Pancreas: Stable fairly marked atrophy of the pancreas but no mass, inflammation or ductal dilatation. Spleen: Normal size.  No focal lesions.  Adrenals/Urinary Tract: Adrenal glands and kidneys are unremarkable. Simple right renal cyst. No ureteral or bladder calculi. The delayed images do not demonstrate any significant collecting system abnormalities. Mild stable cystocele noted. Stomach/Bowel: The stomach is unremarkable. The duodenum is normal. There are mildly dilated fluid-filled loops of small bowel with scattered air-fluid levels. The distal ileum demonstrates some apparent formed stool typically seen with an obstructive process. There are surgical changes involving the distal ileum just proximal to the terminal ileum with a dilated saclike appearance of the ileum. This appears stable. Slight narrowing of the ileal loop proximal to this area. The distal/terminal ileum is normal in caliber/decompressed. Mild mucosal enhancement is noted. There is also some inflammatory type interstitial changes around these distal loops of ileum. Findings suggest an early or partial small bowel obstruction. The colon is moderately decompressed.  Scattered diverticulosis. Vascular/Lymphatic: Stable scattered aortic and iliac artery calcifications. No aneurysm or dissection. The major venous structures are patent. No mesenteric or retroperitoneal mass or adenopathy. Reproductive: The uterus is surgically absent. The left ovary is still present and appears normal. I do not see the right ovary for certain. Other: No pelvic mass or adenopathy. No free pelvic fluid collections. No inguinal mass or adenopathy. Very small periumbilical abdominal wall hernia containing fat. Musculoskeletal: No significant bony findings. IMPRESSION: 1. CT findings consistent with an early or partial small bowel obstruction with transition point in the distal ileum likely due to adhesions. 2. Surgical changes involving the distal ileum just proximal to the terminal ileum with a dilated saclike appearance of the ileum. This appears stable. 3. Small hiatal hernia and slight distal esophageal  wall thickening which could be due to reflux esophagitis. 4. Stable small cystocele. 5. Aortic atherosclerosis. Aortic Atherosclerosis (ICD10-I70.0). Electronically Signed   By: Rudie Meyer M.D.   On: 07/04/2021 13:23    EKG: Independently reviewed.  NSR with rate 90. PVCs.  nonspecific ST changes with no evidence of acute ischemia. Similar to previous ekg.    Labs on Admission: I have personally reviewed the available labs and imaging studies at the time of the admission.  Pertinent labs:  WBC 19.2,  potassium 3.4,  glucose 176,   Assessment/Plan Principal Problem:   Small bowel obstruction due to adhesions (HCC) -Admit to MedSurg and make n.p.o. -  no clinical findings for emergent surgery at this time  -Gentle IV fluid hydration -Pain medication -encourage ambulation -Monitor electrolytes -General surgery consulted and will place consult note but not a surgical candidate at this time and we can manage conservatively -NG tube placed for decompression per surgery   Active Problems:   Hypokalemia -repleting potassium. Goal >4.  -check magnesium -follow     Leukocytosis -reactive vs. Infectious. No other signs of sepsis -gentle IVF, monitor fever curve, clinically and follow in AM    Hypertension To goal. Holding PO meds while NG tube in place.  Prn IV parameters written  Prediabetes A1c of 5.9 in 05/2021 Continue diet controlled measures    Hyperlipemia -holding home statin while ng tube in place -resume when tolerating PO     Body mass index is 32.95 kg/m.  Level of care: Med-Surg DVT prophylaxis:  SCDs, low risk for surgery  Code Status:  Full - confirmed with patient Family Communication: Son at bedside: Stacey [name]  Disposition Plan:  The patient is from: home  Anticipated d/c is to: home  Requires inpatient hospitalization and is at significant risk of worsening, requires constant monitoring, assessment and MDM with specialists.  Patient is currently:  acutely ill Consults called: general surgery by edp   Admission status:  observation   Dragon dictation used in completing this note.    Orland Mustard MD [facility]   How to contact the [facility] Attending or Consulting provider 7A - 7P or covering provider during after hours 7P -7A, for this patient?  Check the care team in [facility] and look for a) attending/consulting [facility] provider listed and b) the [facility] team listed Log into [email or web address] and use 's universal password to access. If you do not have the password, please contact the hospital operator. Locate the [facility] provider you are looking for under [facility] and page to a number that you can be directly reached. If you still have difficulty reaching the provider, please page the [facility] (Director on Call) for the Hospitalists listed on amion for assistance.   07/04/2021, 3:03 PM

## 2021-07-04 NOTE — ED Notes (Signed)
Pt noted to desat after fentanyl. Placed on 2L [place], alert and oriented. No changed to mentation

## 2021-07-04 NOTE — Consult Note (Signed)
[facility] Surgery Consult Note  Stacey [name] July 12, 1941  [id number].    Requesting MD: Dalene Seltzer, MD Chief Complaint/Reason for Consult: SBO  HPI:  [name] is a 80 y/o F with a PMH HTN, HLD, GERD, arthris, and recurrent SBO who presented to the ED with a cc 24 hours of sharp, intermittent, upper abdominal pain. Pain is non-radiating.  Denies aggravating or alleviating factors. Patient reports similar pain in the past when she had a bowel obstruction. Associated sxs include vomiting. denies flatus. Reports 2 BMs yesterday 9/27.  Past abdominal surgeries include abdominal hysterectomy, appendectomy, and ex lap with small bowel resection for SBO.  Denies alcohol, tobacco, or drug use. At baseline she lives alone and is independent of ADLs. Denies use of blood thinning medications.   ED workup significant for leukocytosis 19.2, hypokalemia 3.4, and CT of the abdomen and pelvis showing mildly dilated small bowel and a transition point in the distal ileum. General surgery has been asked to evaluate.   ROS: Review of Systems  Constitutional: Negative.   HENT: Negative.    Eyes: Negative.   Respiratory: Negative.    Cardiovascular: Negative.   Gastrointestinal:  Positive for abdominal pain, nausea and vomiting. Negative for blood in stool, constipation and melena.  Genitourinary: Negative.   Musculoskeletal: Negative.   Skin: Negative.   Neurological: Negative.   Endo/Heme/Allergies: Negative.   Psychiatric/Behavioral: Negative.    All other systems reviewed and are negative.  Family History  Problem Relation Age of Onset   Heart disease Mother    Stroke Father    Hypertension Sister    Hypertension Brother    Hypertension Sister    Hypertension Sister    Hypertension Brother    Hypertension Brother     Past Medical History:  Diagnosis Date   Arthritis    "knees" (05/27/2013)   Bowel obstruction (HCC)    Chronic lower back pain    "bulging discs"  (05/27/2013)   GERD (gastroesophageal reflux disease)    Headache(784.0)    "maybe once/wk' (05/27/2013)   Hyperlipidemia    Hypertension     Past Surgical History:  Procedure Laterality Date   ABDOMINAL HYSTERECTOMY  1980's   APPENDECTOMY  1980's   BILATERAL OOPHORECTOMY Bilateral 2000   COLON SURGERY  10/09/2003   small bowel resection/notes 10/09/2003 (05/27/2013)   INGUINAL HERNIA REPAIR  10/09/2003; 2006   Laparotomy with extensive lysis of adhesions,/notes 10/09/2003 (05/27/2013);     Social History:  reports that she has quit smoking. Her smoking use included cigarettes. She has a 6.25 pack-year smoking history. She has never used smokeless tobacco. She reports that she does not drink alcohol and does not use drugs.  Allergies:  Allergies  Allergen Reactions   Clindamycin/Lincomycin Other (See Comments)    Unknown     Colchicine     Makes her to weak   Other     OMNICEF- DIARRHEA   Phenergan [Promethazine Hcl] Swelling    Mouth swelling   Statins Other (See Comments)    Weakness all over    (Not in a hospital admission)   Blood pressure (!) 109/59, pulse 71, temperature 98.8 F (37.1 C), resp. rate 20, height 5\' 3"  (1.6 m), weight 84.4 kg, SpO2 90 %. Physical Exam: Constitutional: NAD; conversant; no deformities Eyes: Moist conjunctiva; no lid lag; anicteric; PERRL Neck: Trachea midline; no thyromegaly Lungs: Normal respiratory effort; no tactile fremitus CV: RRR; no palpable thrills; no pitting edema GI: Abd soft, obese, mildly distended,  hypoactive BS; nontender, no palpable hepatosplenomegaly MSK: Normal gait; no clubbing/cyanosis Psychiatric: Appropriate affect; alert and oriented x3 Lymphatic: No palpable cervical or axillary lymphadenopathy  Results for orders placed or performed during the hospital encounter of 07/04/21 (from the past 48 hour(s))  Lipase, blood     Status: None   Collection Time: 07/04/21  7:02 AM  Result Value Ref Range   Lipase 28 11 - 51  U/L    Comment: Performed at [facility], [address] [place], [place] [address]  Comprehensive metabolic panel     Status: Abnormal   Collection Time: 07/04/21  7:02 AM  Result Value Ref Range   Sodium 134 (L) 135 - 145 mmol/L   Potassium 3.4 (L) 3.5 - 5.1 mmol/L   Chloride 93 (L) 98 - 111 mmol/L   CO2 29 22 - 32 mmol/L   Glucose, Bld 176 (H) 70 - 99 mg/dL    Comment: Glucose reference range applies only to samples taken after fasting for at least 8 hours.   BUN 14 8 - 23 mg/dL   Creatinine, Ser 2.22 0.44 - 1.00 mg/dL   Calcium 97.9 (H) 8.9 - 10.3 mg/dL   Total Protein 7.1 6.5 - 8.1 g/dL   Albumin 3.8 3.5 - 5.0 g/dL   AST 30 15 - 41 U/L   ALT 22 0 - 44 U/L   Alkaline Phosphatase 65 38 - 126 U/L   Total Bilirubin 0.7 0.3 - 1.2 mg/dL   GFR, Estimated 59 (L) >60 mL/min    Comment: (NOTE) Calculated using the CKD-EPI Creatinine Equation (2021)    Anion gap 12 5 - 15    Comment: Performed at [facility], [address] [place], [place] [address]  CBC     Status: Abnormal   Collection Time: 07/04/21  7:02 AM  Result Value Ref Range   WBC 19.2 (H) 4.0 - 10.5 K/uL   RBC 4.11 3.87 - 5.11 MIL/uL   Hemoglobin 12.3 12.0 - 15.0 g/dL   HCT 94.1 74.0 - 81.4 %   MCV 93.7 80.0 - 100.0 fL   MCH 29.9 26.0 - 34.0 pg   MCHC 31.9 30.0 - 36.0 g/dL   RDW 48.1 85.6 - 31.4 %   Platelets 240 150 - 400 K/uL   nRBC 0.0 0.0 - 0.2 %    Comment: Performed at [facility], [address], [place], [place] [address]  Differential     Status: Abnormal   Collection Time: 07/04/21  7:02 AM  Result Value Ref Range   Neutrophils Relative % 87 %   Neutro Abs 16.5 (H) 1.7 - 7.7 K/uL   Lymphocytes Relative 9 %   Lymphs Abs 1.7 0.7 - 4.0 K/uL   Monocytes Relative 4 %   Monocytes Absolute 0.8 0.1 - 1.0 K/uL   Eosinophils Relative 0 %   Eosinophils Absolute 0.0 0.0 - 0.5 K/uL   Basophils Relative 0 %   Basophils Absolute 0.1 0.0 - 0.1 K/uL   Immature Granulocytes 0 %   Abs Immature  Granulocytes 0.07 0.00 - 0.07 K/uL    Comment: Performed at [facility], [address], [place], [place] [address]  Urinalysis, Routine w reflex microscopic     Status: Abnormal   Collection Time: 07/04/21 10:50 AM  Result Value Ref Range   Color, Urine YELLOW YELLOW   APPearance CLOUDY (A) CLEAR   Specific Gravity, Urine 1.013 1.005 - 1.030   pH  7.0 5.0 - 8.0   Glucose, UA NEGATIVE NEGATIVE mg/dL   Hgb urine dipstick NEGATIVE NEGATIVE   Bilirubin Urine NEGATIVE NEGATIVE   Ketones, ur NEGATIVE NEGATIVE mg/dL   Protein, ur NEGATIVE NEGATIVE mg/dL   Nitrite NEGATIVE NEGATIVE   Leukocytes,Ua TRACE (A) NEGATIVE   RBC / HPF 0-5 0 - 5 RBC/hpf   WBC, UA 0-5 0 - 5 WBC/hpf   Bacteria, UA RARE (A) NONE SEEN   Squamous Epithelial / LPF 0-5 0 - 5   Mucus PRESENT    Hyaline Casts, UA PRESENT    Amorphous Crystal PRESENT     Comment: Performed at [facility], [address], [place], [place] [address]   CT ABDOMEN PELVIS W CONTRAST  Result Date: 07/04/2021 CLINICAL DATA:  Abdominal pain bloating. History of previous bowel surgery. EXAM: CT ABDOMEN AND PELVIS WITH CONTRAST TECHNIQUE: Multidetector CT imaging of the abdomen and pelvis was performed using the standard protocol following bolus administration of intravenous contrast. CONTRAST:  10mL OMNIPAQUE IOHEXOL 350 MG/ML SOLN COMPARISON:  CT scan from 2014 FINDINGS: Lower chest: Streaky basilar scarring or atelectasis. No infiltrates or effusions. The heart is normal in size. No pericardial effusion. Aortic and coronary artery calcifications are noted. There is a small hiatal hernia and slight distal esophageal wall thickening which could be due to reflux esophagitis. Hepatobiliary: No hepatic lesions or intrahepatic biliary dilatation. Scattered calcified granulomas are noted. Gallbladder is unremarkable. No common bile duct dilatation. Pancreas: Stable fairly marked atrophy of the pancreas but no mass, inflammation or ductal  dilatation. Spleen: Normal size.  No focal lesions. Adrenals/Urinary Tract: Adrenal glands and kidneys are unremarkable. Simple right renal cyst. No ureteral or bladder calculi. The delayed images do not demonstrate any significant collecting system abnormalities. Mild stable cystocele noted. Stomach/Bowel: The stomach is unremarkable. The duodenum is normal. There are mildly dilated fluid-filled loops of small bowel with scattered air-fluid levels. The distal ileum demonstrates some apparent formed stool typically seen with an obstructive process. There are surgical changes involving the distal ileum just proximal to the terminal ileum with a dilated saclike appearance of the ileum. This appears stable. Slight narrowing of the ileal loop proximal to this area. The distal/terminal ileum is normal in caliber/decompressed. Mild mucosal enhancement is noted. There is also some inflammatory type interstitial changes around these distal loops of ileum. Findings suggest an early or partial small bowel obstruction. The colon is moderately decompressed.  Scattered diverticulosis. Vascular/Lymphatic: Stable scattered aortic and iliac artery calcifications. No aneurysm or dissection. The major venous structures are patent. No mesenteric or retroperitoneal mass or adenopathy. Reproductive: The uterus is surgically absent. The left ovary is still present and appears normal. I do not see the right ovary for certain. Other: No pelvic mass or adenopathy. No free pelvic fluid collections. No inguinal mass or adenopathy. Very small periumbilical abdominal wall hernia containing fat. Musculoskeletal: No significant bony findings. IMPRESSION: 1. CT findings consistent with an early or partial small bowel obstruction with transition point in the distal ileum likely due to adhesions. 2. Surgical changes involving the distal ileum just proximal to the terminal ileum with a dilated saclike appearance of the ileum. This appears stable. 3.  Small hiatal hernia and slight distal esophageal wall thickening which could be due to reflux esophagitis. 4. Stable small cystocele. 5. Aortic atherosclerosis. Aortic Atherosclerosis (ICD10-I70.0). Electronically Signed   By: Rudie Meyer M.D.   On: 07/04/2021 13:23     Assessment/Plan SBO, recurrent, in the setting  of multiple abdominal surgeries - AFVSS, WBC 19  - CT with small bowel dilation and transition zone in the distal ileum - no emergent surgical needs. recommend NG tube for decompression followed by small bowel protocol.  FEN - NPO, IVF, NG to LIWS, replete K to goal > 4.0 ID- non recommended by general surgery VTE- SCD's, chemical VTE ppx ok from surgical perspective Foley - none   HTN HLD GERD Anxiety   Adam Phenix, PA-C [facility] Please see Amion for pager number during day hours 7:00am-4:30pm 07/04/2021, 1:43 PM

## 2021-07-04 NOTE — ED Notes (Signed)
Consulting PA at bedside. 

## 2021-07-04 NOTE — ED Notes (Signed)
Pt transported to CT ?

## 2021-07-04 NOTE — ED Triage Notes (Addendum)
Pt c/o mid upper abdominal pain with vomiting x5 throughout the night. On assessment has tenderness to upper abdomen and lower abdomen. Has been unable to keep fluids down. States hx of similar s/s with DX SBO.

## 2021-07-05 ENCOUNTER — Inpatient Hospital Stay (HOSPITAL_COMMUNITY): Payer: Medicare Other

## 2021-07-05 ENCOUNTER — Encounter (HOSPITAL_COMMUNITY): Payer: Self-pay | Admitting: Family Medicine

## 2021-07-05 DIAGNOSIS — Z8249 Family history of ischemic heart disease and other diseases of the circulatory system: Secondary | ICD-10-CM | POA: Diagnosis not present

## 2021-07-05 DIAGNOSIS — Z9049 Acquired absence of other specified parts of digestive tract: Secondary | ICD-10-CM | POA: Diagnosis not present

## 2021-07-05 DIAGNOSIS — K565 Intestinal adhesions [bands], unspecified as to partial versus complete obstruction: Secondary | ICD-10-CM | POA: Diagnosis not present

## 2021-07-05 DIAGNOSIS — E785 Hyperlipidemia, unspecified: Secondary | ICD-10-CM | POA: Diagnosis present

## 2021-07-05 DIAGNOSIS — E663 Overweight: Secondary | ICD-10-CM | POA: Diagnosis present

## 2021-07-05 DIAGNOSIS — E119 Type 2 diabetes mellitus without complications: Secondary | ICD-10-CM | POA: Diagnosis present

## 2021-07-05 DIAGNOSIS — K56609 Unspecified intestinal obstruction, unspecified as to partial versus complete obstruction: Secondary | ICD-10-CM | POA: Diagnosis not present

## 2021-07-05 DIAGNOSIS — Z87891 Personal history of nicotine dependence: Secondary | ICD-10-CM | POA: Diagnosis not present

## 2021-07-05 DIAGNOSIS — F411 Generalized anxiety disorder: Secondary | ICD-10-CM | POA: Diagnosis present

## 2021-07-05 DIAGNOSIS — Z823 Family history of stroke: Secondary | ICD-10-CM | POA: Diagnosis not present

## 2021-07-05 DIAGNOSIS — D72829 Elevated white blood cell count, unspecified: Secondary | ICD-10-CM | POA: Diagnosis present

## 2021-07-05 DIAGNOSIS — I10 Essential (primary) hypertension: Secondary | ICD-10-CM | POA: Diagnosis present

## 2021-07-05 DIAGNOSIS — Z20822 Contact with and (suspected) exposure to covid-19: Secondary | ICD-10-CM | POA: Diagnosis present

## 2021-07-05 DIAGNOSIS — Z888 Allergy status to other drugs, medicaments and biological substances status: Secondary | ICD-10-CM | POA: Diagnosis not present

## 2021-07-05 DIAGNOSIS — K5651 Intestinal adhesions [bands], with partial obstruction: Secondary | ICD-10-CM | POA: Diagnosis present

## 2021-07-05 DIAGNOSIS — Z79899 Other long term (current) drug therapy: Secondary | ICD-10-CM | POA: Diagnosis not present

## 2021-07-05 DIAGNOSIS — E876 Hypokalemia: Secondary | ICD-10-CM | POA: Diagnosis present

## 2021-07-05 DIAGNOSIS — K219 Gastro-esophageal reflux disease without esophagitis: Secondary | ICD-10-CM | POA: Diagnosis present

## 2021-07-05 DIAGNOSIS — K5669 Other partial intestinal obstruction: Secondary | ICD-10-CM | POA: Diagnosis not present

## 2021-07-05 DIAGNOSIS — Z6832 Body mass index (BMI) 32.0-32.9, adult: Secondary | ICD-10-CM | POA: Diagnosis not present

## 2021-07-05 LAB — BASIC METABOLIC PANEL
Anion gap: 9 (ref 5–15)
BUN: 10 mg/dL (ref 8–23)
CO2: 26 mmol/L (ref 22–32)
Calcium: 9.3 mg/dL (ref 8.9–10.3)
Chloride: 101 mmol/L (ref 98–111)
Creatinine, Ser: 0.88 mg/dL (ref 0.44–1.00)
GFR, Estimated: >60 mL/min (ref >60)
Glucose, Bld: 117 mg/dL — ABNORMAL HIGH (ref 70–99)
Potassium: 3.6 mmol/L (ref 3.5–5.1)
Sodium: 136 mmol/L (ref 135–145)

## 2021-07-05 LAB — CBC
HCT: 34 % — ABNORMAL LOW (ref 36.0–46.0)
Hemoglobin: 11 g/dL — ABNORMAL LOW (ref 12.0–15.0)
MCH: 30 pg (ref 26.0–34.0)
MCHC: 32.4 g/dL (ref 30.0–36.0)
MCV: 92.6 fL (ref 80.0–100.0)
Platelets: 192 10*3/uL (ref 150–400)
RBC: 3.67 MIL/uL — ABNORMAL LOW (ref 3.87–5.11)
RDW: 13.5 % (ref 11.5–15.5)
WBC: 10.3 10*3/uL (ref 4.0–10.5)
nRBC: 0 % (ref 0.0–0.2)

## 2021-07-05 LAB — GLUCOSE, CAPILLARY: Glucose-Capillary: 103 mg/dL — ABNORMAL HIGH (ref 70–99)

## 2021-07-05 LAB — MAGNESIUM: Magnesium: 1.4 mg/dL — ABNORMAL LOW (ref 1.7–2.4)

## 2021-07-05 MED ORDER — HYDRALAZINE HCL 20 MG/ML IJ SOLN: 10.0000 mg | INTRAMUSCULAR | Status: DC | PRN

## 2021-07-05 MED ORDER — TRAZODONE HCL 50 MG PO TABS: 50.0000 mg | ORAL_TABLET | Freq: Every evening | ORAL | Status: DC | PRN

## 2021-07-05 MED ORDER — IPRATROPIUM-ALBUTEROL 0.5-2.5 (3) MG/3ML IN SOLN: 3.0000 mL | RESPIRATORY_TRACT | Status: DC | PRN

## 2021-07-05 MED ORDER — DEXTROSE IN LACTATED RINGERS 5 % IV SOLN: INTRAVENOUS | Status: AC

## 2021-07-05 MED ORDER — PANTOPRAZOLE SODIUM 40 MG IV SOLR
40.0000 mg | INTRAVENOUS | Status: DC
  Administered 2021-07-05 – 2021-07-07 (×3): 40 mg via INTRAVENOUS
  Filled 2021-07-05 (×3): qty 40

## 2021-07-05 MED ORDER — METOPROLOL TARTRATE 5 MG/5ML IV SOLN: 5.0000 mg | INTRAVENOUS | Status: DC | PRN

## 2021-07-05 MED ORDER — MAGNESIUM SULFATE 2 GM/50ML IV SOLN
2.0000 g | Freq: Once | INTRAVENOUS | Status: AC
  Administered 2021-07-05: 2 g via INTRAVENOUS
  Filled 2021-07-05: qty 50

## 2021-07-05 MED ORDER — POTASSIUM CHLORIDE 10 MEQ/100ML IV SOLN
10.0000 meq | INTRAVENOUS | Status: AC
Start: 2021-07-05 — End: 2021-07-05
  Administered 2021-07-05 (×4): 10 meq via INTRAVENOUS
  Filled 2021-07-05: qty 100

## 2021-07-05 NOTE — Progress Notes (Signed)
Progress Note     Subjective: Patient reports she feels well this AM. Denies abdominal pain or nausea. No flatus or BM yet. She would like to walk today to help relieve back pain. Reports some sinus pain from NGT  Objective: Vital signs in last 24 hours: Temp:  [97.5 F (36.4 C)-98.7 F (37.1 C)] 98.7 F (37.1 C) (09/29 0818) Pulse Rate:  [67-105] 84 (09/29 0818) Resp:  [14-21] 18 (09/29 0818) BP: (98-133)/(58-78) 107/61 (09/29 0818) SpO2:  [90 %-96 %] 94 % (09/29 0818) Weight:  [88 kg] 88 kg (09/29 0015) Last BM Date: 07/03/21  Intake/Output from previous day: 09/28 0701 - 09/29 0700 In: 3195.5 [I.V.:1250.1; IV Piggyback:1945.4] Out: 100 [Emesis/NG output:100] Intake/Output this shift: No intake/output data recorded.  PE: General: pleasant, WD, overweight female who is laying in bed in NAD Heart: regular, rate, and rhythm.   Lungs: Respiratory effort nonlabored Abd: soft, NT, ND, +BS, NGT with some bloody drainage MS: all 4 extremities are symmetrical with no cyanosis, clubbing, or edema. Skin: warm and dry with no masses, lesions, or rashes Neuro: Cranial nerves 2-12 grossly intact, sensation is normal throughout Psych: A&Ox3 with an appropriate affect.    Lab Results:  Recent Labs    07/04/21 0702 07/05/21 0103  WBC 19.2* 10.3  HGB 12.3 11.0*  HCT 38.5 34.0*  PLT 240 192   BMET Recent Labs    07/04/21 0702 07/05/21 0103  NA 134* 136  K 3.4* 3.6  CL 93* 101  CO2 29 26  GLUCOSE 176* 117*  BUN 14 10  CREATININE 0.97 0.88  CALCIUM 10.7* 9.3   PT/INR No results for input(s): LABPROT, INR in the last 72 hours. CMP     Component Value Date/Time   NA 136 07/05/2021 0103   NA 139 05/31/2021 1037   K 3.6 07/05/2021 0103   CL 101 07/05/2021 0103   CO2 26 07/05/2021 0103   GLUCOSE 117 (H) 07/05/2021 0103   BUN 10 07/05/2021 0103   BUN 13 05/31/2021 1037   CREATININE 0.88 07/05/2021 0103   CREATININE 1.08 04/27/2013 1432   CALCIUM 9.3 07/05/2021  0103   PROT 7.1 07/04/2021 0702   PROT 6.9 05/31/2021 1037   ALBUMIN 3.8 07/04/2021 0702   ALBUMIN 4.6 05/31/2021 1037   AST 30 07/04/2021 0702   ALT 22 07/04/2021 0702   ALKPHOS 65 07/04/2021 0702   BILITOT 0.7 07/04/2021 0702   BILITOT 0.2 05/31/2021 1037   GFRNONAA >60 07/05/2021 0103   GFRAA 69 12/01/2020 1132   Lipase     Component Value Date/Time   LIPASE 28 07/04/2021 0702       Studies/Results: CT ABDOMEN PELVIS W CONTRAST  Result Date: 07/04/2021 CLINICAL DATA:  Abdominal pain bloating. History of previous bowel surgery. EXAM: CT ABDOMEN AND PELVIS WITH CONTRAST TECHNIQUE: Multidetector CT imaging of the abdomen and pelvis was performed using the standard protocol following bolus administration of intravenous contrast. CONTRAST:  78mL OMNIPAQUE IOHEXOL 350 MG/ML SOLN COMPARISON:  CT scan from 2014 FINDINGS: Lower chest: Streaky basilar scarring or atelectasis. No infiltrates or effusions. The heart is normal in size. No pericardial effusion. Aortic and coronary artery calcifications are noted. There is a small hiatal hernia and slight distal esophageal wall thickening which could be due to reflux esophagitis. Hepatobiliary: No hepatic lesions or intrahepatic biliary dilatation. Scattered calcified granulomas are noted. Gallbladder is unremarkable. No common bile duct dilatation. Pancreas: Stable fairly marked atrophy of the pancreas but no mass, inflammation or  ductal dilatation. Spleen: Normal size.  No focal lesions. Adrenals/Urinary Tract: Adrenal glands and kidneys are unremarkable. Simple right renal cyst. No ureteral or bladder calculi. The delayed images do not demonstrate any significant collecting system abnormalities. Mild stable cystocele noted. Stomach/Bowel: The stomach is unremarkable. The duodenum is normal. There are mildly dilated fluid-filled loops of small bowel with scattered air-fluid levels. The distal ileum demonstrates some apparent formed stool typically  seen with an obstructive process. There are surgical changes involving the distal ileum just proximal to the terminal ileum with a dilated saclike appearance of the ileum. This appears stable. Slight narrowing of the ileal loop proximal to this area. The distal/terminal ileum is normal in caliber/decompressed. Mild mucosal enhancement is noted. There is also some inflammatory type interstitial changes around these distal loops of ileum. Findings suggest an early or partial small bowel obstruction. The colon is moderately decompressed.  Scattered diverticulosis. Vascular/Lymphatic: Stable scattered aortic and iliac artery calcifications. No aneurysm or dissection. The major venous structures are patent. No mesenteric or retroperitoneal mass or adenopathy. Reproductive: The uterus is surgically absent. The left ovary is still present and appears normal. I do not see the right ovary for certain. Other: No pelvic mass or adenopathy. No free pelvic fluid collections. No inguinal mass or adenopathy. Very small periumbilical abdominal wall hernia containing fat. Musculoskeletal: No significant bony findings. IMPRESSION: 1. CT findings consistent with an early or partial small bowel obstruction with transition point in the distal ileum likely due to adhesions. 2. Surgical changes involving the distal ileum just proximal to the terminal ileum with a dilated saclike appearance of the ileum. This appears stable. 3. Small hiatal hernia and slight distal esophageal wall thickening which could be due to reflux esophagitis. 4. Stable small cystocele. 5. Aortic atherosclerosis. Aortic Atherosclerosis (ICD10-I70.0). Electronically Signed   By: Rudie Meyer M.D.   On: 07/04/2021 13:23   DG Abd Portable 1V-Small Bowel Protocol-Position Verification  Result Date: 07/04/2021 CLINICAL DATA:  Encounter for imaging study to confirm nasogastric (NG) tube placement EXAM: PORTABLE ABDOMEN - 1 VIEW COMPARISON:  None. FINDINGS: Enteric  tube with tip coursing below the hemidiaphragm just distal to the gastroesophageal junction. Side port overlying the expected region of the distal esophagus. Intravenous contrast excretion of the right kidney is noted. Bibasilar atelectasis of the lungs. Nonobstructive bowel gas pattern noted within the upper abdomen. IMPRESSION: Enteric tube with tip coursing below the hemidiaphragm likely just distal to the gastroesophageal junction. Side port overlying the expected region of the distal esophagus. Recommend advancing by 5 cm. Electronically Signed   By: Tish Frederickson M.D.   On: 07/04/2021 15:56    Anti-infectives: Anti-infectives (From admission, onward)    None        Assessment/Plan SBO, recurrent, in the setting of multiple abdominal surgeries - AFVSS, WBC 10 today  - CT 9/28 with small bowel dilation and transition zone in the distal ileum - gastrografin was not given yesterday, discussed with RN today who will administer gastrografin - mobilize as tolerated - added protonix for bloody NGT drainage   FEN - NPO, IVF, NG to LIWS, replete K to goal > 4.0 ID- none recommended by general surgery VTE- SCD's, chemical VTE ppx ok from surgical perspective Foley - none    HTN HLD GERD Anxiety  LOS: 0 days    Juliet Rude, [facility] 07/05/2021, 9:30 AM Please see Amion for pager number during day hours 7:00am-4:30pm

## 2021-07-05 NOTE — Progress Notes (Signed)
PROGRESS NOTE    Stacey [name]  WUJ:[id number] DOB: 02/04/1941 DOA: 07/04/2021 PCP: [name], FNP   Brief Narrative:  80 year old with history of HTN, HLD, DM2, GERD, osteoarthritis, SBO with extensive lysis of adhesion and inguinal hernia repair, hysterectomy, appendectomy, bilateral oophorectomy presented to the hospital with abdominal pain and vomiting.  CT abdomen pelvis showed early/partial small bowel obstruction.  General surgery was consulted.   Assessment & Plan:   Principal Problem:   Small bowel obstruction due to adhesions [facility]) Active Problems:   Hypertension   Hypokalemia   Hyperlipemia   Prediabetes   Leukocytosis   Small bowel obstruction likely secondary to adhesions - Conservative management at this time.  Patient is n.p.o. with NG tube in place.  Small bowel protocol.  Continue IV fluids.  Early mobilization.  General surgery following.  Leukocytosis - Suspect reactive.  Continue to monitor this for now.  Essential hypertension - Currently NPO.  IV as needed ordered  Hyperlipidemia - Statin on hold    DVT prophylaxis: SCDs Start: 07/04/21 1455 Code Status: Full code Family Communication:      Dispo: The patient is from: Home              Anticipated d/c is to: Home              Patient currently is not medically stable to d/c.  Bowel function returned to normal.  She remains in the hospital   Difficult to place patient No         Subjective: Still has not passed any gas.  Denies any nausea vomiting.  Review of Systems Otherwise negative except as per HPI, including: General: Denies fever, chills, night sweats or unintended weight loss. Resp: Denies cough, wheezing, shortness of breath. Cardiac: Denies chest pain, palpitations, orthopnea, paroxysmal nocturnal dyspnea. GI: Denies abdominal pain, nausea, vomiting, diarrhea or constipation GU: Denies dysuria, frequency, hesitancy or incontinence MS: Denies muscle aches, joint  pain or swelling Neuro: Denies headache, neurologic deficits (focal weakness, numbness, tingling), abnormal gait Psych: Denies anxiety, depression, SI/HI/AVH Skin: Denies new rashes or lesions ID: Denies sick contacts, exotic exposures, travel  Examination:  General exam: Appears calm and comfortable.  NG tube currently in place Respiratory system: Clear to auscultation. Respiratory effort normal. Cardiovascular system: S1 & S2 heard, RRR. No JVD, murmurs, rubs, gallops or clicks. No pedal edema. Gastrointestinal system: Abdomen is nondistended, soft and nontender. No organomegaly or masses felt. Normal bowel sounds heard. Central nervous system: Alert and oriented. No focal neurological deficits. Extremities: Symmetric 5 x 5 power. Skin: No rashes, lesions or ulcers Psychiatry: Judgement and insight appear normal. Mood & affect appropriate.     Objective: Vitals:   07/04/21 2100 07/04/21 2200 07/04/21 2300 07/05/21 0015  BP: 116/64 102/60 102/62 113/68  Pulse: 88 83 84 89  Resp: 20 18 20 17   Temp: 98.2 F (36.8 C)  98 F (36.7 C) (!) 97.5 F (36.4 C)  TempSrc: Oral   Oral  SpO2: 95% 96% 93% 94%  Weight:    88 kg  Height:    5\' 3"  (1.6 m)    Intake/Output Summary (Last 24 hours) at 07/05/2021 0809 Last data filed at 07/05/2021 0636 Gross per 24 hour  Intake 3195.45 ml  Output 100 ml  Net 3095.45 ml   Filed Weights   07/04/21 0658 07/05/21 0015  Weight: 84.4 kg 88 kg     Data Reviewed:   CBC: Recent Labs  Lab 07/04/21 0702 07/05/21  0103  WBC 19.2* 10.3  NEUTROABS 16.5*  --   HGB 12.3 11.0*  HCT 38.5 34.0*  MCV 93.7 92.6  PLT 240 192   Basic Metabolic Panel: Recent Labs  Lab 07/04/21 0702 07/05/21 0103  NA 134* 136  K 3.4* 3.6  CL 93* 101  CO2 29 26  GLUCOSE 176* 117*  BUN 14 10  CREATININE 0.97 0.88  CALCIUM 10.7* 9.3  MG  --  1.4*   GFR: Estimated Creatinine Clearance: 54.5 mL/min (by C-G formula based on SCr of 0.88 mg/dL). Liver Function  Tests: Recent Labs  Lab 07/04/21 0702  AST 30  ALT 22  ALKPHOS 65  BILITOT 0.7  PROT 7.1  ALBUMIN 3.8   Recent Labs  Lab 07/04/21 0702  LIPASE 28   No results for input(s): AMMONIA in the last 168 hours. Coagulation Profile: No results for input(s): INR, PROTIME in the last 168 hours. Cardiac Enzymes: No results for input(s): CKTOTAL, CKMB, CKMBINDEX, TROPONINI in the last 168 hours. BNP (last 3 results) No results for input(s): PROBNP in the last 8760 hours. HbA1C: No results for input(s): HGBA1C in the last 72 hours. CBG: No results for input(s): GLUCAP in the last 168 hours. Lipid Profile: No results for input(s): CHOL, HDL, LDLCALC, TRIG, CHOLHDL, LDLDIRECT in the last 72 hours. Thyroid Function Tests: No results for input(s): TSH, T4TOTAL, FREET4, T3FREE, THYROIDAB in the last 72 hours. Anemia Panel: No results for input(s): VITAMINB12, FOLATE, FERRITIN, TIBC, IRON, RETICCTPCT in the last 72 hours. Sepsis Labs: No results for input(s): PROCALCITON, LATICACIDVEN in the last 168 hours.  Recent Results (from the past 240 hour(s))  Resp Panel by RT-PCR (Flu A&B, Covid) Nasopharyngeal Swab     Status: None   Collection Time: 07/04/21  2:25 PM   Specimen: Nasopharyngeal Swab; Nasopharyngeal(NP) swabs in vial transport medium  Result Value Ref Range Status   SARS Coronavirus 2 by RT PCR NEGATIVE NEGATIVE Final    Comment: (NOTE) SARS-CoV-2 target nucleic acids are NOT DETECTED.  The SARS-CoV-2 RNA is generally detectable in upper respiratory specimens during the acute phase of infection. The lowest concentration of SARS-CoV-2 viral copies this assay can detect is 138 copies/mL. A negative result does not preclude SARS-Cov-2 infection and should not be used as the sole basis for treatment or other patient management decisions. A negative result may occur with  improper specimen collection/handling, submission of specimen other than nasopharyngeal swab, presence of  viral mutation(s) within the areas targeted by this assay, and inadequate number of viral copies(<138 copies/mL). A negative result must be combined with clinical observations, patient history, and epidemiological information. The expected result is Negative.  Fact Sheet for Patients:  BloggerCourse.com  Fact Sheet for Healthcare Providers:  SeriousBroker.it  This test is no t yet approved or cleared by the [place] FDA and  has been authorized for detection and/or diagnosis of SARS-CoV-2 by FDA under an Emergency Use Authorization (EUA). This EUA will remain  in effect (meaning this test can be used) for the duration of the COVID-19 declaration under Section 564(b)(1) of the Act, 21 U.S.C.section 360bbb-3(b)(1), unless the authorization is terminated  or revoked sooner.       Influenza A by PCR NEGATIVE NEGATIVE Final   Influenza B by PCR NEGATIVE NEGATIVE Final    Comment: (NOTE) The Xpert Xpress SARS-CoV-2/FLU/RSV plus assay is intended as an aid in the diagnosis of influenza from Nasopharyngeal swab specimens and should not be used as a sole basis for  treatment. Nasal washings and aspirates are unacceptable for Xpert Xpress SARS-CoV-2/FLU/RSV testing.  Fact Sheet for Patients: BloggerCourse.com  Fact Sheet for Healthcare Providers: SeriousBroker.it  This test is not yet approved or cleared by the [place] FDA and has been authorized for detection and/or diagnosis of SARS-CoV-2 by FDA under an Emergency Use Authorization (EUA). This EUA will remain in effect (meaning this test can be used) for the duration of the COVID-19 declaration under Section 564(b)(1) of the Act, 21 U.S.C. section 360bbb-3(b)(1), unless the authorization is terminated or revoked.  Performed at [facility], [address] [place], [place] [address]          Radiology  Studies: CT ABDOMEN PELVIS W CONTRAST  Result Date: 07/04/2021 CLINICAL DATA:  Abdominal pain bloating. History of previous bowel surgery. EXAM: CT ABDOMEN AND PELVIS WITH CONTRAST TECHNIQUE: Multidetector CT imaging of the abdomen and pelvis was performed using the standard protocol following bolus administration of intravenous contrast. CONTRAST:  31mL OMNIPAQUE IOHEXOL 350 MG/ML SOLN COMPARISON:  CT scan from 2014 FINDINGS: Lower chest: Streaky basilar scarring or atelectasis. No infiltrates or effusions. The heart is normal in size. No pericardial effusion. Aortic and coronary artery calcifications are noted. There is a small hiatal hernia and slight distal esophageal wall thickening which could be due to reflux esophagitis. Hepatobiliary: No hepatic lesions or intrahepatic biliary dilatation. Scattered calcified granulomas are noted. Gallbladder is unremarkable. No common bile duct dilatation. Pancreas: Stable fairly marked atrophy of the pancreas but no mass, inflammation or ductal dilatation. Spleen: Normal size.  No focal lesions. Adrenals/Urinary Tract: Adrenal glands and kidneys are unremarkable. Simple right renal cyst. No ureteral or bladder calculi. The delayed images do not demonstrate any significant collecting system abnormalities. Mild stable cystocele noted. Stomach/Bowel: The stomach is unremarkable. The duodenum is normal. There are mildly dilated fluid-filled loops of small bowel with scattered air-fluid levels. The distal ileum demonstrates some apparent formed stool typically seen with an obstructive process. There are surgical changes involving the distal ileum just proximal to the terminal ileum with a dilated saclike appearance of the ileum. This appears stable. Slight narrowing of the ileal loop proximal to this area. The distal/terminal ileum is normal in caliber/decompressed. Mild mucosal enhancement is noted. There is also some inflammatory type interstitial changes around these  distal loops of ileum. Findings suggest an early or partial small bowel obstruction. The colon is moderately decompressed.  Scattered diverticulosis. Vascular/Lymphatic: Stable scattered aortic and iliac artery calcifications. No aneurysm or dissection. The major venous structures are patent. No mesenteric or retroperitoneal mass or adenopathy. Reproductive: The uterus is surgically absent. The left ovary is still present and appears normal. I do not see the right ovary for certain. Other: No pelvic mass or adenopathy. No free pelvic fluid collections. No inguinal mass or adenopathy. Very small periumbilical abdominal wall hernia containing fat. Musculoskeletal: No significant bony findings. IMPRESSION: 1. CT findings consistent with an early or partial small bowel obstruction with transition point in the distal ileum likely due to adhesions. 2. Surgical changes involving the distal ileum just proximal to the terminal ileum with a dilated saclike appearance of the ileum. This appears stable. 3. Small hiatal hernia and slight distal esophageal wall thickening which could be due to reflux esophagitis. 4. Stable small cystocele. 5. Aortic atherosclerosis. Aortic Atherosclerosis (ICD10-I70.0). Electronically Signed   By: Rudie Meyer M.D.   On: 07/04/2021 13:23   DG Abd Portable 1V-Small Bowel Protocol-Position Verification  Result Date: 07/04/2021 CLINICAL DATA:  Encounter for imaging study to confirm nasogastric (NG) tube placement EXAM: PORTABLE ABDOMEN - 1 VIEW COMPARISON:  None. FINDINGS: Enteric tube with tip coursing below the hemidiaphragm just distal to the gastroesophageal junction. Side port overlying the expected region of the distal esophagus. Intravenous contrast excretion of the right kidney is noted. Bibasilar atelectasis of the lungs. Nonobstructive bowel gas pattern noted within the upper abdomen. IMPRESSION: Enteric tube with tip coursing below the hemidiaphragm likely just distal to the  gastroesophageal junction. Side port overlying the expected region of the distal esophagus. Recommend advancing by 5 cm. Electronically Signed   By: Tish Frederickson M.D.   On: 07/04/2021 15:56        Scheduled Meds:  diatrizoate meglumine-sodium  90 mL Per NG tube Once   Continuous Infusions:  lactated ringers 75 mL/hr at 07/05/21 0636     LOS: 0 days   Time spent= 35 mins    Jaelen Soth Joline Maxcy, MD [facility]  If 7PM-7AM, please contact night-coverage  07/05/2021, 8:09 AM

## 2021-07-06 LAB — BASIC METABOLIC PANEL
Anion gap: 6 (ref 5–15)
BUN: 9 mg/dL (ref 8–23)
CO2: 30 mmol/L (ref 22–32)
Calcium: 8.4 mg/dL — ABNORMAL LOW (ref 8.9–10.3)
Chloride: 105 mmol/L (ref 98–111)
Creatinine, Ser: 0.81 mg/dL (ref 0.44–1.00)
GFR, Estimated: >60 mL/min (ref >60)
Glucose, Bld: 121 mg/dL — ABNORMAL HIGH (ref 70–99)
Potassium: 3.7 mmol/L (ref 3.5–5.1)
Sodium: 141 mmol/L (ref 135–145)

## 2021-07-06 LAB — CBC
HCT: 32.8 % — ABNORMAL LOW (ref 36.0–46.0)
Hemoglobin: 10.3 g/dL — ABNORMAL LOW (ref 12.0–15.0)
MCH: 29.9 pg (ref 26.0–34.0)
MCHC: 31.4 g/dL (ref 30.0–36.0)
MCV: 95.3 fL (ref 80.0–100.0)
Platelets: 187 10*3/uL (ref 150–400)
RBC: 3.44 MIL/uL — ABNORMAL LOW (ref 3.87–5.11)
RDW: 13.8 % (ref 11.5–15.5)
WBC: 7.5 10*3/uL (ref 4.0–10.5)
nRBC: 0 % (ref 0.0–0.2)

## 2021-07-06 LAB — PHOSPHORUS: Phosphorus: 2.3 mg/dL — ABNORMAL LOW (ref 2.5–4.6)

## 2021-07-06 LAB — GLUCOSE, CAPILLARY
Glucose-Capillary: 105 mg/dL — ABNORMAL HIGH (ref 70–99)
Glucose-Capillary: 109 mg/dL — ABNORMAL HIGH (ref 70–99)
Glucose-Capillary: 112 mg/dL — ABNORMAL HIGH (ref 70–99)
Glucose-Capillary: 119 mg/dL — ABNORMAL HIGH (ref 70–99)

## 2021-07-06 LAB — MAGNESIUM: Magnesium: 1.9 mg/dL (ref 1.7–2.4)

## 2021-07-06 MED ORDER — POTASSIUM PHOSPHATES 15 MMOLE/5ML IV SOLN
15.0000 mmol | Freq: Once | INTRAVENOUS | Status: AC
  Administered 2021-07-06: 15 mmol via INTRAVENOUS
  Filled 2021-07-06: qty 5

## 2021-07-06 NOTE — Plan of Care (Signed)
  Problem: Education: Goal: Knowledge of General Education information will improve Description: Including pain rating scale, medication(s)/side effects and non-pharmacologic comfort measures 07/06/2021 2249 by Saddie Benders, RN Outcome: Progressing 07/06/2021 2026 by Saddie Benders, RN Outcome: Progressing   Problem: Clinical Measurements: Goal: Will remain free from infection 07/06/2021 2249 by Saddie Benders, RN Outcome: Progressing 07/06/2021 2026 by Saddie Benders, RN Outcome: Progressing

## 2021-07-06 NOTE — Plan of Care (Signed)
  Problem: Education: Goal: Knowledge of General Education information will improve Description: Including pain rating scale, medication(s)/side effects and non-pharmacologic comfort measures Outcome: Progressing   Problem: Clinical Measurements: Goal: Will remain free from infection Outcome: Progressing   

## 2021-07-06 NOTE — Progress Notes (Signed)
Progress Note     Subjective: Patient denies abdominal pain and reports multiple loose BMs yesterday. She was not able to ambulate in halls because she was having to use the bathroom too frequently.   Objective: Vital signs in last 24 hours: Temp:  [97.8 F (36.6 C)-98.5 F (36.9 C)] 98 F (36.7 C) (09/30 0747) Pulse Rate:  [84-97] 84 (09/30 0747) Resp:  [17] 17 (09/30 0747) BP: (105-125)/(53-68) 105/53 (09/30 0747) SpO2:  [92 %-95 %] 92 % (09/30 0747) Last BM Date: 07/05/21  Intake/Output from previous day: 09/29 0701 - 09/30 0700 In: 1973.7 [I.V.:1673.6; IV Piggyback:300.1] Out: 350 [Emesis/NG output:350] Intake/Output this shift: No intake/output data recorded.  PE: General: pleasant, WD, overweight female who is laying in bed in NAD Heart: regular, rate, and rhythm.   Lungs: Respiratory effort nonlabored Abd: soft, NT, ND, +BS, NGT with some bloody drainage MS: all 4 extremities are symmetrical with no cyanosis, clubbing, or edema. Skin: warm and dry with no masses, lesions, or rashes Neuro: Cranial nerves 2-12 grossly intact, sensation is normal throughout Psych: A&Ox3 with an appropriate affect.    Lab Results:  Recent Labs    07/05/21 0103 07/06/21 0309  WBC 10.3 7.5  HGB 11.0* 10.3*  HCT 34.0* 32.8*  PLT 192 187   BMET Recent Labs    07/05/21 0103 07/06/21 0309  NA 136 141  K 3.6 3.7  CL 101 105  CO2 26 30  GLUCOSE 117* 121*  BUN 10 9  CREATININE 0.88 0.81  CALCIUM 9.3 8.4*   PT/INR No results for input(s): LABPROT, INR in the last 72 hours. CMP     Component Value Date/Time   NA 141 07/06/2021 0309   NA 139 05/31/2021 1037   K 3.7 07/06/2021 0309   CL 105 07/06/2021 0309   CO2 30 07/06/2021 0309   GLUCOSE 121 (H) 07/06/2021 0309   BUN 9 07/06/2021 0309   BUN 13 05/31/2021 1037   CREATININE 0.81 07/06/2021 0309   CREATININE 1.08 04/27/2013 1432   CALCIUM 8.4 (L) 07/06/2021 0309   PROT 7.1 07/04/2021 0702   PROT 6.9 05/31/2021  1037   ALBUMIN 3.8 07/04/2021 0702   ALBUMIN 4.6 05/31/2021 1037   AST 30 07/04/2021 0702   ALT 22 07/04/2021 0702   ALKPHOS 65 07/04/2021 0702   BILITOT 0.7 07/04/2021 0702   BILITOT 0.2 05/31/2021 1037   GFRNONAA >60 07/06/2021 0309   GFRAA 69 12/01/2020 1132   Lipase     Component Value Date/Time   LIPASE 28 07/04/2021 0702       Studies/Results: CT ABDOMEN PELVIS W CONTRAST  Result Date: 07/04/2021 CLINICAL DATA:  Abdominal pain bloating. History of previous bowel surgery. EXAM: CT ABDOMEN AND PELVIS WITH CONTRAST TECHNIQUE: Multidetector CT imaging of the abdomen and pelvis was performed using the standard protocol following bolus administration of intravenous contrast. CONTRAST:  25mL OMNIPAQUE IOHEXOL 350 MG/ML SOLN COMPARISON:  CT scan from 2014 FINDINGS: Lower chest: Streaky basilar scarring or atelectasis. No infiltrates or effusions. The heart is normal in size. No pericardial effusion. Aortic and coronary artery calcifications are noted. There is a small hiatal hernia and slight distal esophageal wall thickening which could be due to reflux esophagitis. Hepatobiliary: No hepatic lesions or intrahepatic biliary dilatation. Scattered calcified granulomas are noted. Gallbladder is unremarkable. No common bile duct dilatation. Pancreas: Stable fairly marked atrophy of the pancreas but no mass, inflammation or ductal dilatation. Spleen: Normal size.  No focal lesions. Adrenals/Urinary Tract: Adrenal  glands and kidneys are unremarkable. Simple right renal cyst. No ureteral or bladder calculi. The delayed images do not demonstrate any significant collecting system abnormalities. Mild stable cystocele noted. Stomach/Bowel: The stomach is unremarkable. The duodenum is normal. There are mildly dilated fluid-filled loops of small bowel with scattered air-fluid levels. The distal ileum demonstrates some apparent formed stool typically seen with an obstructive process. There are surgical  changes involving the distal ileum just proximal to the terminal ileum with a dilated saclike appearance of the ileum. This appears stable. Slight narrowing of the ileal loop proximal to this area. The distal/terminal ileum is normal in caliber/decompressed. Mild mucosal enhancement is noted. There is also some inflammatory type interstitial changes around these distal loops of ileum. Findings suggest an early or partial small bowel obstruction. The colon is moderately decompressed.  Scattered diverticulosis. Vascular/Lymphatic: Stable scattered aortic and iliac artery calcifications. No aneurysm or dissection. The major venous structures are patent. No mesenteric or retroperitoneal mass or adenopathy. Reproductive: The uterus is surgically absent. The left ovary is still present and appears normal. I do not see the right ovary for certain. Other: No pelvic mass or adenopathy. No free pelvic fluid collections. No inguinal mass or adenopathy. Very small periumbilical abdominal wall hernia containing fat. Musculoskeletal: No significant bony findings. IMPRESSION: 1. CT findings consistent with an early or partial small bowel obstruction with transition point in the distal ileum likely due to adhesions. 2. Surgical changes involving the distal ileum just proximal to the terminal ileum with a dilated saclike appearance of the ileum. This appears stable. 3. Small hiatal hernia and slight distal esophageal wall thickening which could be due to reflux esophagitis. 4. Stable small cystocele. 5. Aortic atherosclerosis. Aortic Atherosclerosis (ICD10-I70.0). Electronically Signed   By: Rudie Meyer M.D.   On: 07/04/2021 13:23   DG Abd Portable 1V-Small Bowel Obstruction Protocol-initial, 8 hr delay  Result Date: 07/05/2021 CLINICAL DATA:  Small-bowel obstruction 8 hour delay. EXAM: PORTABLE ABDOMEN - 1 VIEW COMPARISON:  July 04, 2021 (2:43 p.m.) FINDINGS: A nasogastric tube is seen with its distal tip overlying the  expected region of the gastric antrum. The bowel gas pattern is normal. Radiopaque contrast is seen throughout the large bowel. No radio-opaque calculi or other significant radiographic abnormality are seen. IMPRESSION: No evidence of bowel obstruction. Electronically Signed   By: Aram Candela M.D.   On: 07/05/2021 20:09   DG Abd Portable 1V-Small Bowel Protocol-Position Verification  Result Date: 07/04/2021 CLINICAL DATA:  Encounter for imaging study to confirm nasogastric (NG) tube placement EXAM: PORTABLE ABDOMEN - 1 VIEW COMPARISON:  None. FINDINGS: Enteric tube with tip coursing below the hemidiaphragm just distal to the gastroesophageal junction. Side port overlying the expected region of the distal esophagus. Intravenous contrast excretion of the right kidney is noted. Bibasilar atelectasis of the lungs. Nonobstructive bowel gas pattern noted within the upper abdomen. IMPRESSION: Enteric tube with tip coursing below the hemidiaphragm likely just distal to the gastroesophageal junction. Side port overlying the expected region of the distal esophagus. Recommend advancing by 5 cm. Electronically Signed   By: Tish Frederickson M.D.   On: 07/04/2021 15:56    Anti-infectives: Anti-infectives (From admission, onward)    None        Assessment/Plan SBO, recurrent, in the setting of multiple abdominal surgeries - AFVSS, WBC 10 today  - CT 9/28 with small bowel dilation and transition zone in the distal ileum - 8h delay film without small bowel dilation and contrast throughout colon,  having bowel movements - removed NGT and starting CLD - can advance to FLD this evening if tolerating CLD - mobilize as tolerated    FEN - CLD, decreased IVF to 50 cc/h, replete K to goal > 4.0 ID- none recommended by general surgery VTE- SCD's, chemical VTE ppx ok from surgical perspective Foley - none    HTN HLD GERD Anxiety  LOS: 1 day    Juliet Rude, [facility] Surgery 07/06/2021,  10:04 AM Please see Amion for pager number during day hours 7:00am-4:30pm

## 2021-07-06 NOTE — Progress Notes (Signed)
NGT was removed by MD today, with normal bowel and abdominal pain. Slowly introducing fluid and food on her. Currently on Full liq diet. Will monitor pt.

## 2021-07-07 LAB — CBC
HCT: 31.1 % — ABNORMAL LOW (ref 36.0–46.0)
Hemoglobin: 9.8 g/dL — ABNORMAL LOW (ref 12.0–15.0)
MCH: 29.9 pg (ref 26.0–34.0)
MCHC: 31.5 g/dL (ref 30.0–36.0)
MCV: 94.8 fL (ref 80.0–100.0)
Platelets: 170 10*3/uL (ref 150–400)
RBC: 3.28 MIL/uL — ABNORMAL LOW (ref 3.87–5.11)
RDW: 13.8 % (ref 11.5–15.5)
WBC: 7 10*3/uL (ref 4.0–10.5)
nRBC: 0 % (ref 0.0–0.2)

## 2021-07-07 LAB — BASIC METABOLIC PANEL
Anion gap: 6 (ref 5–15)
BUN: 10 mg/dL (ref 8–23)
CO2: 27 mmol/L (ref 22–32)
Calcium: 8 mg/dL — ABNORMAL LOW (ref 8.9–10.3)
Chloride: 104 mmol/L (ref 98–111)
Creatinine, Ser: 0.77 mg/dL (ref 0.44–1.00)
GFR, Estimated: >60 mL/min (ref >60)
Glucose, Bld: 98 mg/dL (ref 70–99)
Potassium: 3.4 mmol/L — ABNORMAL LOW (ref 3.5–5.1)
Sodium: 137 mmol/L (ref 135–145)

## 2021-07-07 LAB — MAGNESIUM: Magnesium: 1.7 mg/dL (ref 1.7–2.4)

## 2021-07-07 LAB — GLUCOSE, CAPILLARY
Glucose-Capillary: 97 mg/dL (ref 70–99)
Glucose-Capillary: 96 mg/dL (ref 70–99)

## 2021-07-07 MED ORDER — POTASSIUM CHLORIDE CRYS ER 20 MEQ PO TBCR
40.0000 meq | EXTENDED_RELEASE_TABLET | Freq: Once | ORAL | Status: AC
  Administered 2021-07-07: 40 meq via ORAL
  Filled 2021-07-07: qty 2

## 2021-07-07 MED ORDER — MAGNESIUM OXIDE -MG SUPPLEMENT 400 (240 MG) MG PO TABS
800.0000 mg | ORAL_TABLET | Freq: Once | ORAL | Status: AC
  Administered 2021-07-07: 800 mg via ORAL
  Filled 2021-07-07: qty 2

## 2021-07-07 NOTE — Progress Notes (Signed)
PROGRESS NOTE    Stacey [name]  DXA:[id number] DOB: 1941-05-10 DOA: 07/04/2021 PCP: [name], FNP   Brief Narrative:  80 year old with history of HTN, HLD, DM2, GERD, osteoarthritis, SBO with extensive lysis of adhesion and inguinal hernia repair, hysterectomy, appendectomy, bilateral oophorectomy presented to the hospital with abdominal pain and vomiting.  CT abdomen pelvis showed early/partial small bowel obstruction.  General surgery was consulted.  NG tube removed on 9/30.   Assessment & Plan:   Principal Problem:   Small bowel obstruction due to adhesions [facility]) Active Problems:   Hypertension   Hypokalemia   Hyperlipemia   Prediabetes   Leukocytosis   Small bowel obstruction (HCC)   Small bowel obstruction likely secondary to adhesions - Conservative management at this time.  Advance diet to clear, NG tube to be removed by general surgery.  Leukocytosis - Suspect reactive.  Improved  Essential hypertension -IV as needed  Hyperlipidemia - Statin on hold    DVT prophylaxis: SCDs Start: 07/04/21 1455 Code Status: Full code Family Communication:      Dispo: The patient is from: Home              Anticipated d/c is to: Home              Patient currently is not medically stable to d/c.  Awaiting bowel function to return if she tolerates oral she can be discharged in next 24 hours   Difficult to place patient No         Subjective: Feels much better but not passing gas yet.  Review of Systems Otherwise negative except as per HPI, including: General: Denies fever, chills, night sweats or unintended weight loss. Resp: Denies cough, wheezing, shortness of breath. Cardiac: Denies chest pain, palpitations, orthopnea, paroxysmal nocturnal dyspnea. GI: Denies abdominal pain, nausea, vomiting, diarrhea or constipation GU: Denies dysuria, frequency, hesitancy or incontinence MS: Denies muscle aches, joint pain or swelling Neuro: Denies headache,  neurologic deficits (focal weakness, numbness, tingling), abnormal gait Psych: Denies anxiety, depression, SI/HI/AVH Skin: Denies new rashes or lesions ID: Denies sick contacts, exotic exposures, travel  Examination:  Constitutional: Not in acute distress Respiratory: Clear to auscultation bilaterally Cardiovascular: Normal sinus rhythm, no rubs Abdomen: Nontender nondistended good bowel sounds Musculoskeletal: No edema noted Skin: No rashes seen Neurologic: CN 2-12 grossly intact.  And nonfocal Psychiatric: Normal judgment and insight. Alert and oriented x 3. Normal mood.    Objective: Vitals:   07/06/21 0747 07/06/21 1306 07/06/21 2018 07/07/21 0700  BP: (!) 105/53 109/66 124/78 104/66  Pulse: 84 79 97 80  Resp: 17 18 16 18   Temp: 98 F (36.7 C) 98 F (36.7 C) 98.3 F (36.8 C) 97.8 F (36.6 C)  TempSrc: Oral Oral Oral Oral  SpO2: 92% 91%  92%  Weight:      Height:        Intake/Output Summary (Last 24 hours) at 07/07/2021 0931 Last data filed at 07/06/2021 2200 Gross per 24 hour  Intake 729.06 ml  Output --  Net 729.06 ml   Filed Weights   07/04/21 0658 07/05/21 0015  Weight: 84.4 kg 88 kg     Data Reviewed:   CBC: Recent Labs  Lab 07/04/21 0702 07/05/21 0103 07/06/21 0309 07/07/21 0237  WBC 19.2* 10.3 7.5 7.0  NEUTROABS 16.5*  --   --   --   HGB 12.3 11.0* 10.3* 9.8*  HCT 38.5 34.0* 32.8* 31.1*  MCV 93.7 92.6 95.3 94.8  PLT 240 192  187 170   Basic Metabolic Panel: Recent Labs  Lab 07/04/21 0702 07/05/21 0103 07/06/21 0309 07/07/21 0237  NA 134* 136 141 137  K 3.4* 3.6 3.7 3.4*  CL 93* 101 105 104  CO2 29 26 30 27   GLUCOSE 176* 117* 121* 98  BUN 14 10 9 10   CREATININE 0.97 0.88 0.81 0.77  CALCIUM 10.7* 9.3 8.4* 8.0*  MG  --  1.4* 1.9 1.7  PHOS  --   --  2.3*  --    GFR: Estimated Creatinine Clearance: 60 mL/min (by C-G formula based on SCr of 0.77 mg/dL). Liver Function Tests: Recent Labs  Lab 07/04/21 0702  AST 30  ALT 22   ALKPHOS 65  BILITOT 0.7  PROT 7.1  ALBUMIN 3.8   Recent Labs  Lab 07/04/21 0702  LIPASE 28   No results for input(s): AMMONIA in the last 168 hours. Coagulation Profile: No results for input(s): INR, PROTIME in the last 168 hours. Cardiac Enzymes: No results for input(s): CKTOTAL, CKMB, CKMBINDEX, TROPONINI in the last 168 hours. BNP (last 3 results) No results for input(s): PROBNP in the last 8760 hours. HbA1C: No results for input(s): HGBA1C in the last 72 hours. CBG: Recent Labs  Lab 07/06/21 0000 07/06/21 0557 07/06/21 1246 07/06/21 1640 07/07/21 0831  GLUCAP 105* 119* 109* 112* 97   Lipid Profile: No results for input(s): CHOL, HDL, LDLCALC, TRIG, CHOLHDL, LDLDIRECT in the last 72 hours. Thyroid Function Tests: No results for input(s): TSH, T4TOTAL, FREET4, T3FREE, THYROIDAB in the last 72 hours. Anemia Panel: No results for input(s): VITAMINB12, FOLATE, FERRITIN, TIBC, IRON, RETICCTPCT in the last 72 hours. Sepsis Labs: No results for input(s): PROCALCITON, LATICACIDVEN in the last 168 hours.  Recent Results (from the past 240 hour(s))  Resp Panel by RT-PCR (Flu A&B, Covid) Nasopharyngeal Swab     Status: None   Collection Time: 07/04/21  2:25 PM   Specimen: Nasopharyngeal Swab; Nasopharyngeal(NP) swabs in vial transport medium  Result Value Ref Range Status   SARS Coronavirus 2 by RT PCR NEGATIVE NEGATIVE Final    Comment: (NOTE) SARS-CoV-2 target nucleic acids are NOT DETECTED.  The SARS-CoV-2 RNA is generally detectable in upper respiratory specimens during the acute phase of infection. The lowest concentration of SARS-CoV-2 viral copies this assay can detect is 138 copies/mL. A negative result does not preclude SARS-Cov-2 infection and should not be used as the sole basis for treatment or other patient management decisions. A negative result may occur with  improper specimen collection/handling, submission of specimen other than nasopharyngeal swab,  presence of viral mutation(s) within the areas targeted by this assay, and inadequate number of viral copies(<138 copies/mL). A negative result must be combined with clinical observations, patient history, and epidemiological information. The expected result is Negative.  Fact Sheet for Patients:  09/06/21  Fact Sheet for Healthcare Providers:  07/06/21  This test is no t yet approved or cleared by the [employer] FDA and  has been authorized for detection and/or diagnosis of SARS-CoV-2 by FDA under an Emergency Use Authorization (EUA). This EUA will remain  in effect (meaning this test can be used) for the duration of the COVID-19 declaration under Section 564(b)(1) of the Act, 21 U.S.C.section 360bbb-3(b)(1), unless the authorization is terminated  or revoked sooner.       Influenza A by PCR NEGATIVE NEGATIVE Final   Influenza B by PCR NEGATIVE NEGATIVE Final    Comment: (NOTE) The Xpert Xpress SARS-CoV-2/FLU/RSV plus assay is intended  as an aid in the diagnosis of influenza from Nasopharyngeal swab specimens and should not be used as a sole basis for treatment. Nasal washings and aspirates are unacceptable for Xpert Xpress SARS-CoV-2/FLU/RSV testing.  Fact Sheet for Patients: [employer]  Fact Sheet for Healthcare Providers: [employer]  This test is not yet approved or cleared by the [place] FDA and has been authorized for detection and/or diagnosis of SARS-CoV-2 by FDA under an Emergency Use Authorization (EUA). This EUA will remain in effect (meaning this test can be used) for the duration of the COVID-19 declaration under Section 564(b)(1) of the Act, 21 U.S.C. section 360bbb-3(b)(1), unless the authorization is terminated or revoked.  Performed at [facility], [address] [place], [place] [address]           Radiology Studies: DG Abd Portable 1V-Small Bowel Obstruction Protocol-initial, 8 hr delay  Result Date: 07/05/2021 CLINICAL DATA:  Small-bowel obstruction 8 hour delay. EXAM: PORTABLE ABDOMEN - 1 VIEW COMPARISON:  July 04, 2021 (2:43 p.m.) FINDINGS: A nasogastric tube is seen with its distal tip overlying the expected region of the gastric antrum. The bowel gas pattern is normal. Radiopaque contrast is seen throughout the large bowel. No radio-opaque calculi or other significant radiographic abnormality are seen. IMPRESSION: No evidence of bowel obstruction. Electronically Signed   By: Aram Candela M.D.   On: 07/05/2021 20:09        Scheduled Meds:  pantoprazole (PROTONIX) IV  40 mg Intravenous Q24H   Continuous Infusions:     LOS: 2 days   Time spent= 35 mins    Donat Humble Joline Maxcy, MD [facility]  If 7PM-7AM, please contact night-coverage  07/07/2021, 9:31 AM

## 2021-07-07 NOTE — Discharge Summary (Signed)
Physician Discharge Summary  KEYANDRA SWENSON MCN:[id number] DOB: 11/23/1940 DOA: 07/04/2021  PCP: [name], FNP  Admit date: 07/04/2021 Discharge date: 07/07/2021  Admitted From: Home Disposition: Home  Recommendations for Outpatient Follow-up:  Follow up with PCP in 1-2 weeks Please obtain BMP/CBC in one week your next doctors visit.     Discharge Condition: Stable CODE STATUS: Full code Diet recommendation: 2 g salt  Brief/Interim Summary: 80 year old with history of HTN, HLD, DM2, GERD, osteoarthritis, SBO with extensive lysis of adhesion and inguinal hernia repair, hysterectomy, appendectomy, bilateral oophorectomy presented to the hospital with abdominal pain and vomiting.  CT abdomen pelvis showed early/partial small bowel obstruction.  General surgery was consulted.  Patient was managed conservatively with NG tube placement which eventually improved her symptoms on 9/30, NG tube was removed.  Oral diet was started.  Today she is having bowel movement and passing gas.  If she tolerates her lunch per general surgery she can be discharged later today. Assessment & Plan:   Principal Problem:   Small bowel obstruction due to adhesions [facility]) Active Problems:   Hypertension   Hypokalemia   Hyperlipemia   Prediabetes   Leukocytosis     Small bowel obstruction likely secondary to adhesions - Conservative management at this time.  Likely secondary to adhesion, NG tube removed 9/30.  Tolerating orals.  If cleared by general surgery she can be discharged today after lunch   Leukocytosis - Resolved   Essential hypertension - Resume home regimen   Hyperlipidemia - Statin       Body mass index is 34.37 kg/m.         Discharge Diagnoses:  Principal Problem:   Small bowel obstruction due to adhesions [facility]) Active Problems:   Hypertension   Hypokalemia   Hyperlipemia   Prediabetes   Leukocytosis   Small bowel obstruction  (HCC)      Consultations: General surgery  Subjective: Tolerating clears without any issues, passed gas overnight and had a small bowel movement.  No other complaints, wishes to go home after lunch if she tolerates it.  Discharge Exam: Vitals:   07/06/21 2018 07/07/21 0700  BP: 124/78 104/66  Pulse: 97 80  Resp: 16 18  Temp: 98.3 F (36.8 C) 97.8 F (36.6 C)  SpO2:  92%   Vitals:   07/06/21 0747 07/06/21 1306 07/06/21 2018 07/07/21 0700  BP: (!) 105/53 109/66 124/78 104/66  Pulse: 84 79 97 80  Resp: 17 18 16 18   Temp: 98 F (36.7 C) 98 F (36.7 C) 98.3 F (36.8 C) 97.8 F (36.6 C)  TempSrc: Oral Oral Oral Oral  SpO2: 92% 91%  92%  Weight:      Height:        General: Pt is alert, awake, not in acute distress Cardiovascular: RRR, S1/S2 +, no rubs, no gallops Respiratory: CTA bilaterally, no wheezing, no rhonchi Abdominal: Soft, NT, ND, bowel sounds + Extremities: no edema, no cyanosis  Discharge Instructions   Allergies as of 07/07/2021       Reactions   Clindamycin/lincomycin Diarrhea      Colchicine    Makes her to weak   Other    OMNICEF- DIARRHEA   Phenergan [promethazine Hcl] Swelling   Mouth swelling   Statins Other (See Comments)   Weakness all over        Medication List     TAKE these medications    acetaminophen 325 MG tablet Commonly known as: TYLENOL Take 650 mg by  mouth every 6 (six) hours as needed for moderate pain or fever.   atorvastatin 20 MG tablet Commonly known as: LIPITOR Take 1 tablet (20 mg total) by mouth daily.   fish oil-omega-3 fatty acids 1000 MG capsule Take 1 g by mouth 2 (two) times daily.   hydrochlorothiazide 25 MG tablet Commonly known as: HYDRODIURIL Take 1 tablet (25 mg total) by mouth daily.   loratadine 10 MG tablet Commonly known as: CLARITIN Take 10 mg by mouth daily.   multivitamin with minerals Tabs tablet Take 1 tablet by mouth daily.   oxymetazoline 0.05 % nasal spray Commonly known  as: AFRIN Place 2 sprays into both nostrils 2 (two) times daily as needed for congestion.        Follow-up Information     [name], FNP Follow up in 1 week(s).   Specialty: Family Medicine Contact information: [address] [place] [address] [phone]                Allergies  Allergen Reactions   Clindamycin/Lincomycin Diarrhea        Colchicine     Makes her to weak   Other     OMNICEF- DIARRHEA   Phenergan [Promethazine Hcl] Swelling    Mouth swelling   Statins Other (See Comments)    Weakness all over    You were cared for by a hospitalist during your hospital stay. If you have any questions about your discharge medications or the care you received while you were in the hospital after you are discharged, you can call the unit and asked to speak with the hospitalist on call if the hospitalist that took care of you is not available. Once you are discharged, your primary care physician will handle any further medical issues. Please note that no refills for any discharge medications will be authorized once you are discharged, as it is imperative that you return to your primary care physician (or establish a relationship with a primary care physician if you do not have one) for your aftercare needs so that they can reassess your need for medications and monitor your lab values.   Procedures/Studies: CT ABDOMEN PELVIS W CONTRAST  Result Date: 07/04/2021 CLINICAL DATA:  Abdominal pain bloating. History of previous bowel surgery. EXAM: CT ABDOMEN AND PELVIS WITH CONTRAST TECHNIQUE: Multidetector CT imaging of the abdomen and pelvis was performed using the standard protocol following bolus administration of intravenous contrast. CONTRAST:  21mL OMNIPAQUE IOHEXOL 350 MG/ML SOLN COMPARISON:  CT scan from 2014 FINDINGS: Lower chest: Streaky basilar scarring or atelectasis. No infiltrates or effusions. The heart is normal in size. No pericardial effusion.  Aortic and coronary artery calcifications are noted. There is a small hiatal hernia and slight distal esophageal wall thickening which could be due to reflux esophagitis. Hepatobiliary: No hepatic lesions or intrahepatic biliary dilatation. Scattered calcified granulomas are noted. Gallbladder is unremarkable. No common bile duct dilatation. Pancreas: Stable fairly marked atrophy of the pancreas but no mass, inflammation or ductal dilatation. Spleen: Normal size.  No focal lesions. Adrenals/Urinary Tract: Adrenal glands and kidneys are unremarkable. Simple right renal cyst. No ureteral or bladder calculi. The delayed images do not demonstrate any significant collecting system abnormalities. Mild stable cystocele noted. Stomach/Bowel: The stomach is unremarkable. The duodenum is normal. There are mildly dilated fluid-filled loops of small bowel with scattered air-fluid levels. The distal ileum demonstrates some apparent formed stool typically seen with an obstructive process. There are surgical changes involving the distal  ileum just proximal to the terminal ileum with a dilated saclike appearance of the ileum. This appears stable. Slight narrowing of the ileal loop proximal to this area. The distal/terminal ileum is normal in caliber/decompressed. Mild mucosal enhancement is noted. There is also some inflammatory type interstitial changes around these distal loops of ileum. Findings suggest an early or partial small bowel obstruction. The colon is moderately decompressed.  Scattered diverticulosis. Vascular/Lymphatic: Stable scattered aortic and iliac artery calcifications. No aneurysm or dissection. The major venous structures are patent. No mesenteric or retroperitoneal mass or adenopathy. Reproductive: The uterus is surgically absent. The left ovary is still present and appears normal. I do not see the right ovary for certain. Other: No pelvic mass or adenopathy. No free pelvic fluid collections. No inguinal mass  or adenopathy. Very small periumbilical abdominal wall hernia containing fat. Musculoskeletal: No significant bony findings. IMPRESSION: 1. CT findings consistent with an early or partial small bowel obstruction with transition point in the distal ileum likely due to adhesions. 2. Surgical changes involving the distal ileum just proximal to the terminal ileum with a dilated saclike appearance of the ileum. This appears stable. 3. Small hiatal hernia and slight distal esophageal wall thickening which could be due to reflux esophagitis. 4. Stable small cystocele. 5. Aortic atherosclerosis. Aortic Atherosclerosis (ICD10-I70.0). Electronically Signed   By: Rudie Meyer M.D.   On: 07/04/2021 13:23   DG Abd Portable 1V-Small Bowel Obstruction Protocol-initial, 8 hr delay  Result Date: 07/05/2021 CLINICAL DATA:  Small-bowel obstruction 8 hour delay. EXAM: PORTABLE ABDOMEN - 1 VIEW COMPARISON:  July 04, 2021 (2:43 p.m.) FINDINGS: A nasogastric tube is seen with its distal tip overlying the expected region of the gastric antrum. The bowel gas pattern is normal. Radiopaque contrast is seen throughout the large bowel. No radio-opaque calculi or other significant radiographic abnormality are seen. IMPRESSION: No evidence of bowel obstruction. Electronically Signed   By: Aram Candela M.D.   On: 07/05/2021 20:09   DG Abd Portable 1V-Small Bowel Protocol-Position Verification  Result Date: 07/04/2021 CLINICAL DATA:  Encounter for imaging study to confirm nasogastric (NG) tube placement EXAM: PORTABLE ABDOMEN - 1 VIEW COMPARISON:  None. FINDINGS: Enteric tube with tip coursing below the hemidiaphragm just distal to the gastroesophageal junction. Side port overlying the expected region of the distal esophagus. Intravenous contrast excretion of the right kidney is noted. Bibasilar atelectasis of the lungs. Nonobstructive bowel gas pattern noted within the upper abdomen. IMPRESSION: Enteric tube with tip coursing  below the hemidiaphragm likely just distal to the gastroesophageal junction. Side port overlying the expected region of the distal esophagus. Recommend advancing by 5 cm. Electronically Signed   By: Tish Frederickson M.D.   On: 07/04/2021 15:56     The results of significant diagnostics from this hospitalization (including imaging, microbiology, ancillary and laboratory) are listed below for reference.     Microbiology: Recent Results (from the past 240 hour(s))  Resp Panel by RT-PCR (Flu A&B, Covid) Nasopharyngeal Swab     Status: None   Collection Time: 07/04/21  2:25 PM   Specimen: Nasopharyngeal Swab; Nasopharyngeal(NP) swabs in vial transport medium  Result Value Ref Range Status   SARS Coronavirus 2 by RT PCR NEGATIVE NEGATIVE Final    Comment: (NOTE) SARS-CoV-2 target nucleic acids are NOT DETECTED.  The SARS-CoV-2 RNA is generally detectable in upper respiratory specimens during the acute phase of infection. The lowest concentration of SARS-CoV-2 viral copies this assay can detect is 138 copies/mL. A negative result  does not preclude SARS-Cov-2 infection and should not be used as the sole basis for treatment or other patient management decisions. A negative result may occur with  improper specimen collection/handling, submission of specimen other than nasopharyngeal swab, presence of viral mutation(s) within the areas targeted by this assay, and inadequate number of viral copies(<138 copies/mL). A negative result must be combined with clinical observations, patient history, and epidemiological information. The expected result is Negative.  Fact Sheet for Patients:  BloggerCourse.com  Fact Sheet for Healthcare Providers:  SeriousBroker.it  This test is no t yet approved or cleared by the [place] FDA and  has been authorized for detection and/or diagnosis of SARS-CoV-2 by FDA under an Emergency Use Authorization (EUA).  This EUA will remain  in effect (meaning this test can be used) for the duration of the COVID-19 declaration under Section 564(b)(1) of the Act, 21 U.S.C.section 360bbb-3(b)(1), unless the authorization is terminated  or revoked sooner.       Influenza A by PCR NEGATIVE NEGATIVE Final   Influenza B by PCR NEGATIVE NEGATIVE Final    Comment: (NOTE) The Xpert Xpress SARS-CoV-2/FLU/RSV plus assay is intended as an aid in the diagnosis of influenza from Nasopharyngeal swab specimens and should not be used as a sole basis for treatment. Nasal washings and aspirates are unacceptable for Xpert Xpress SARS-CoV-2/FLU/RSV testing.  Fact Sheet for Patients: BloggerCourse.com  Fact Sheet for Healthcare Providers: SeriousBroker.it  This test is not yet approved or cleared by the [place] FDA and has been authorized for detection and/or diagnosis of SARS-CoV-2 by FDA under an Emergency Use Authorization (EUA). This EUA will remain in effect (meaning this test can be used) for the duration of the COVID-19 declaration under Section 564(b)(1) of the Act, 21 U.S.C. section 360bbb-3(b)(1), unless the authorization is terminated or revoked.  Performed at [facility], [address], [place], [place] [address]      Labs: BNP (last 3 results) No results for input(s): BNP in the last 8760 hours. Basic Metabolic Panel: Recent Labs  Lab 07/04/21 0702 07/05/21 0103 07/06/21 0309 07/07/21 0237  NA 134* 136 141 137  K 3.4* 3.6 3.7 3.4*  CL 93* 101 105 104  CO2 GLUCOSE 176* 117* 121* 98  BUN CREATININE 0.97 0.88 0.81 0.77  CALCIUM 10.7* 9.3 8.4* 8.0*  MG  --  1.4* 1.9 1.7  PHOS  --   --  2.3*  --    Liver Function Tests: Recent Labs  Lab 07/04/21 0702  AST 30  ALT 22  ALKPHOS 65  BILITOT 0.7  PROT 7.1  ALBUMIN 3.8   Recent Labs  Lab 07/04/21 0702  LIPASE 28   No results for input(s):  AMMONIA in the last 168 hours. CBC: Recent Labs  Lab 07/04/21 0702 07/05/21 0103 07/06/21 0309 07/07/21 0237  WBC 19.2* 10.3 7.5 7.0  NEUTROABS 16.5*  --   --   --   HGB 12.3 11.0* 10.3* 9.8*  HCT 38.5 34.0* 32.8* 31.1*  MCV 93.7 92.6 95.3 94.8  PLT 240 192 187 170   Cardiac Enzymes: No results for input(s): CKTOTAL, CKMB, CKMBINDEX, TROPONINI in the last 168 hours. BNP: Invalid input(s): POCBNP CBG: Recent Labs  Lab 07/06/21 0000 07/06/21 0557 07/06/21 1246 07/06/21 1640 07/07/21 0831  GLUCAP 105* 119* 109* 112* 97   D-Dimer No results for input(s): DDIMER in the last 72 hours. Hgb A1c No results for input(s):  HGBA1C in the last 72 hours. Lipid Profile No results for input(s): CHOL, HDL, LDLCALC, TRIG, CHOLHDL, LDLDIRECT in the last 72 hours. Thyroid function studies No results for input(s): TSH, T4TOTAL, T3FREE, THYROIDAB in the last 72 hours.  Invalid input(s): FREET3 Anemia work up No results for input(s): VITAMINB12, FOLATE, FERRITIN, TIBC, IRON, RETICCTPCT in the last 72 hours. Urinalysis    Component Value Date/Time   COLORURINE YELLOW 07/04/2021 1050   APPEARANCEUR CLOUDY (A) 07/04/2021 1050   LABSPEC 1.013 07/04/2021 1050   PHURINE 7.0 07/04/2021 1050   GLUCOSEU NEGATIVE 07/04/2021 1050   HGBUR NEGATIVE 07/04/2021 1050   BILIRUBINUR NEGATIVE 07/04/2021 1050   KETONESUR NEGATIVE 07/04/2021 1050   PROTEINUR NEGATIVE 07/04/2021 1050   UROBILINOGEN 0.2 06/10/2013 1022   NITRITE NEGATIVE 07/04/2021 1050   LEUKOCYTESUR TRACE (A) 07/04/2021 1050   Sepsis Labs Invalid input(s): PROCALCITONIN,  WBC,  LACTICIDVEN Microbiology Recent Results (from the past 240 hour(s))  Resp Panel by RT-PCR (Flu A&B, Covid) Nasopharyngeal Swab     Status: None   Collection Time: 07/04/21  2:25 PM   Specimen: Nasopharyngeal Swab; Nasopharyngeal(NP) swabs in vial transport medium  Result Value Ref Range Status   SARS Coronavirus 2 by RT PCR NEGATIVE NEGATIVE Final     Comment: (NOTE) SARS-CoV-2 target nucleic acids are NOT DETECTED.  The SARS-CoV-2 RNA is generally detectable in upper respiratory specimens during the acute phase of infection. The lowest concentration of SARS-CoV-2 viral copies this assay can detect is 138 copies/mL. A negative result does not preclude SARS-Cov-2 infection and should not be used as the sole basis for treatment or other patient management decisions. A negative result may occur with  improper specimen collection/handling, submission of specimen other than nasopharyngeal swab, presence of viral mutation(s) within the areas targeted by this assay, and inadequate number of viral copies(<138 copies/mL). A negative result must be combined with clinical observations, patient history, and epidemiological information. The expected result is Negative.  Fact Sheet for Patients:  BloggerCourse.com  Fact Sheet for Healthcare Providers:  SeriousBroker.it  This test is no t yet approved or cleared by the [place] FDA and  has been authorized for detection and/or diagnosis of SARS-CoV-2 by FDA under an Emergency Use Authorization (EUA). This EUA will remain  in effect (meaning this test can be used) for the duration of the COVID-19 declaration under Section 564(b)(1) of the Act, 21 U.S.C.section 360bbb-3(b)(1), unless the authorization is terminated  or revoked sooner.       Influenza A by PCR NEGATIVE NEGATIVE Final   Influenza B by PCR NEGATIVE NEGATIVE Final    Comment: (NOTE) The Xpert Xpress SARS-CoV-2/FLU/RSV plus assay is intended as an aid in the diagnosis of influenza from Nasopharyngeal swab specimens and should not be used as a sole basis for treatment. Nasal washings and aspirates are unacceptable for Xpert Xpress SARS-CoV-2/FLU/RSV testing.  Fact Sheet for Patients: BloggerCourse.com  Fact Sheet for Healthcare  Providers: SeriousBroker.it  This test is not yet approved or cleared by the [place] FDA and has been authorized for detection and/or diagnosis of SARS-CoV-2 by FDA under an Emergency Use Authorization (EUA). This EUA will remain in effect (meaning this test can be used) for the duration of the COVID-19 declaration under Section 564(b)(1) of the Act, 21 U.S.C. section 360bbb-3(b)(1), unless the authorization is terminated or revoked.  Performed at [facility], [address] [place], [place] [address]      Time coordinating discharge:  I have spent 35  minutes face to face with the patient and on the ward discussing the patients care, assessment, plan and disposition with other care givers. >50% of the time was devoted counseling the patient about the risks and benefits of treatment/Discharge disposition and coordinating care.   SIGNED:   Dimple Nanas, MD  [facility] 07/07/2021, 9:28 AM   If 7PM-7AM, please contact night-coverage

## 2021-07-07 NOTE — Progress Notes (Signed)
Pt verbalized understanding of all DC teaching and will be going home with husband as transportation. She has all her belongings in her possession. IV removed without complications.

## 2021-07-07 NOTE — Progress Notes (Signed)
Patient ID: Stacey [name], female   DOB: 1941-09-14, 80 y.o.   MRN: [id number] [facility] Surgery Progress Note     Subjective: CC-  Sitting on EOB. No complaints. Denies abdominal pain, n/v. Tolerating FLD. Passing flatus. BM yesterday  Objective: Vital signs in last 24 hours: Temp:  [97.8 F (36.6 C)-98.3 F (36.8 C)] 97.8 F (36.6 C) (10/01 0700) Pulse Rate:  [79-97] 80 (10/01 0700) Resp:  [16-18] 18 (10/01 0700) BP: (104-124)/(66-78) 104/66 (10/01 0700) SpO2:  [91 %-92 %] 92 % (10/01 0700) Last BM Date: 07/06/21  Intake/Output from previous day: 09/30 0701 - 10/01 0700 In: 729.1 [P.O.:500; IV Piggyback:229.1] Out: -  Intake/Output this shift: No intake/output data recorded.  PE: Gen:  Alert, NAD, pleasant Pulm: rate and effort normal Abd: Soft, NT/ND, +BS  Lab Results:  Recent Labs    07/06/21 0309 07/07/21 0237  WBC 7.5 7.0  HGB 10.3* 9.8*  HCT 32.8* 31.1*  PLT 187 170   BMET Recent Labs    07/06/21 0309 07/07/21 0237  NA 141 137  K 3.7 3.4*  CL 105 104  CO2 30 27  GLUCOSE 121* 98  BUN 9 10  CREATININE 0.81 0.77  CALCIUM 8.4* 8.0*   PT/INR No results for input(s): LABPROT, INR in the last 72 hours. CMP     Component Value Date/Time   NA 137 07/07/2021 0237   NA 139 05/31/2021 1037   K 3.4 (L) 07/07/2021 0237   CL 104 07/07/2021 0237   CO2 27 07/07/2021 0237   GLUCOSE 98 07/07/2021 0237   BUN 10 07/07/2021 0237   BUN 13 05/31/2021 1037   CREATININE 0.77 07/07/2021 0237   CREATININE 1.08 04/27/2013 1432   CALCIUM 8.0 (L) 07/07/2021 0237   PROT 7.1 07/04/2021 0702   PROT 6.9 05/31/2021 1037   ALBUMIN 3.8 07/04/2021 0702   ALBUMIN 4.6 05/31/2021 1037   AST 30 07/04/2021 0702   ALT 22 07/04/2021 0702   ALKPHOS 65 07/04/2021 0702   BILITOT 0.7 07/04/2021 0702   BILITOT 0.2 05/31/2021 1037   GFRNONAA >60 07/07/2021 0237   GFRAA 69 12/01/2020 1132   Lipase     Component Value Date/Time   LIPASE 28 07/04/2021 0702        Studies/Results: DG Abd Portable 1V-Small Bowel Obstruction Protocol-initial, 8 hr delay  Result Date: 07/05/2021 CLINICAL DATA:  Small-bowel obstruction 8 hour delay. EXAM: PORTABLE ABDOMEN - 1 VIEW COMPARISON:  July 04, 2021 (2:43 p.m.) FINDINGS: A nasogastric tube is seen with its distal tip overlying the expected region of the gastric antrum. The bowel gas pattern is normal. Radiopaque contrast is seen throughout the large bowel. No radio-opaque calculi or other significant radiographic abnormality are seen. IMPRESSION: No evidence of bowel obstruction. Electronically Signed   By: Aram Candela M.D.   On: 07/05/2021 20:09    Anti-infectives: Anti-infectives (From admission, onward)    None        Assessment/Plan SBO, recurrent, in the setting of multiple abdominal surgeries - CT 9/28 with small bowel dilation and transition zone in the distal ileum - 8h delay film without small bowel dilation and contrast throughout colon, having bowel movements - Tolerating FLD and having bowel function. Advance to soft diet. Ok for discharge today from surgical standpoint.     FEN - soft ID- none recommended by general surgery VTE- SCD's, chemical VTE ppx ok from surgical perspective Foley - none    HTN HLD GERD Anxiety   LOS: 2  days    Franne Forts, [facility] 07/07/2021, 10:25 AM Please see Amion for pager number during day hours 7:00am-4:30pm

## 2021-07-10 ENCOUNTER — Telehealth: Payer: Self-pay

## 2021-07-10 NOTE — Telephone Encounter (Signed)
Transition Care Management Unsuccessful Follow-up Telephone Call  Date of discharge and from where:  Stacey [name] 07/07/2021  Diagnosis:  Small Bowel Obstruction due to adhesions   Attempts:  1st Attempt  Reason for unsuccessful TCM follow-up call:  Left voice message

## 2021-07-11 NOTE — Telephone Encounter (Signed)
Transition Care Management Follow-up Telephone Call Date of discharge and from where: 07/07/2021 - [place] Diagnosis:  SBO due to adhesions How have you been since you were released from the hospital? Still very weak - has had a hard time getting her bowels to move, but finally had a little success last night and this morning Any questions or concerns? No  Items Reviewed: Did the pt receive and understand the discharge instructions provided? Yes  Medications obtained and verified? Yes  Other? No  Any new allergies since your discharge? No  Dietary orders reviewed? Yes Do you have support at home? Yes   Home Care and Equipment/Supplies: Were home health services ordered? no If so, what is the name of the agency? N/a  Has the agency set up a time to come to the patient's home? not applicable Were any new equipment or medical supplies ordered?  No What is the name of the medical supply agency? N/a Were you able to get the supplies/equipment? not applicable Do you have any questions related to the use of the equipment or supplies? No  Functional Questionnaire: (I = Independent and D = Dependent) ADLs: I  Bathing/Dressing- I  Meal Prep- I  Eating- I  Maintaining continence- I  Transferring/Ambulation- I  Managing Meds- I  Follow up appointments reviewed:  PCP Hospital f/u appt confirmed? No  Per patient, she is very tired and this happens to her about 4 times per year and she finds it redundant to go to the doctor each time - she declines appt at this time, but will contact us if she changes her mind Specialist Hospital f/u appt confirmed? No   Are transportation arrangements needed? No  If their condition worsens, is the pt aware to call PCP or go to the Emergency Dept.? Yes Was the patient provided with contact information for the PCP's office or ED? Yes Was to pt encouraged to call back with questions or concerns? Yes

## 2021-07-12 ENCOUNTER — Ambulatory Visit (INDEPENDENT_AMBULATORY_CARE_PROVIDER_SITE_OTHER): Payer: Medicare Other | Admitting: Family Medicine

## 2021-07-12 ENCOUNTER — Encounter: Payer: Self-pay | Admitting: Family Medicine

## 2021-07-12 DIAGNOSIS — J069 Acute upper respiratory infection, unspecified: Secondary | ICD-10-CM | POA: Diagnosis not present

## 2021-07-12 DIAGNOSIS — R059 Cough, unspecified: Secondary | ICD-10-CM

## 2021-07-12 LAB — VERITOR FLU A/B WAIVED
Influenza A: NEGATIVE
Influenza B: NEGATIVE

## 2021-07-12 NOTE — Progress Notes (Signed)
Virtual Visit via Telephone Note  I connected with Stacey [name] on 07/12/21 at 2:22 PM by telephone and verified that I am speaking with the correct person using two identifiers. Stacey [name] is currently located at home and nobody is currently with her during this visit. The provider, Gwenlyn Fudge, FNP is located in their office at time of visit.  I discussed the limitations, risks, security and privacy concerns of performing an evaluation and management service by telephone and the availability of in person appointments. I also discussed with the patient that there may be a patient responsible charge related to this service. The patient expressed understanding and agreed to proceed.  Subjective: PCP: [name], FNP  Chief Complaint  Patient presents with   Cough   Fever   Patient complains of cough and fever. Max temp 100.5. Additional symptoms include head congestion. Onset of symptoms was 4 days ago, gradually worsening since that time. She is drinking plenty of fluids. Evaluation to date: none. Treatment to date:  Tylenol . She does not smoke.    ROS: Per HPI  Current Outpatient Medications:    acetaminophen (TYLENOL) 325 MG tablet, Take 650 mg by mouth every 6 (six) hours as needed for moderate pain or fever., Disp: , Rfl:    atorvastatin (LIPITOR) 20 MG tablet, Take 1 tablet (20 mg total) by mouth daily., Disp: 90 tablet, Rfl: 3   fish oil-omega-3 fatty acids 1000 MG capsule, Take 1 g by mouth 2 (two) times daily., Disp: , Rfl:    hydrochlorothiazide (HYDRODIURIL) 25 MG tablet, Take 1 tablet (25 mg total) by mouth daily., Disp: 90 tablet, Rfl: 2   loratadine (CLARITIN) 10 MG tablet, Take 10 mg by mouth daily., Disp: , Rfl:    Multiple Vitamin (MULITIVITAMIN WITH MINERALS) TABS, Take 1 tablet by mouth daily., Disp: , Rfl:    oxymetazoline (AFRIN) 0.05 % nasal spray, Place 2 sprays into both nostrils 2 (two) times daily as needed for congestion., Disp: , Rfl:    Allergies  Allergen Reactions   Clindamycin/Lincomycin Diarrhea        Colchicine     Makes her to weak   Other     OMNICEF- DIARRHEA   Phenergan [Promethazine Hcl] Swelling    Mouth swelling   Statins Other (See Comments)    Weakness all over   Past Medical History:  Diagnosis Date   Arthritis    "knees" (05/27/2013)   Bowel obstruction (HCC)    Chronic lower back pain    "bulging discs" (05/27/2013)   GERD (gastroesophageal reflux disease)    Headache(784.0)    "maybe once/wk' (05/27/2013)   Hyperlipidemia    Hypertension     Observations/Objective: A&O  No respiratory distress or wheezing audible over the phone Mood, judgement, and thought processes all WNL  Assessment and Plan: 1. Viral URI Discussed symptom management.  2. Cough, unspecified type - Novel Coronavirus, NAA ([facility]); Future - Veritor Flu A/B Waived; Future   Follow Up Instructions:  I discussed the assessment and treatment plan with the patient. The patient was provided an opportunity to ask questions and all were answered. The patient agreed with the plan and demonstrated an understanding of the instructions.   The patient was advised to call back or seek an in-person evaluation if the symptoms worsen or if the condition fails to improve as anticipated.  The above assessment and management plan was discussed with the patient. The patient verbalized understanding of and  has agreed to the management plan. Patient is aware to call the clinic if symptoms persist or worsen. Patient is aware when to return to the clinic for a follow-up visit. Patient educated on when it is appropriate to go to the emergency department.   Time call ended: 2:33 PM  I provided 11 minutes of non-face-to-face time during this encounter.  Deliah Boston, MSN, APRN, FNP-C [facility] 07/12/21

## 2021-07-12 NOTE — Addendum Note (Signed)
Addended by: Cassell Clement on: 07/12/2021 03:42 PM   Modules accepted: Orders

## 2021-07-13 ENCOUNTER — Other Ambulatory Visit: Payer: Self-pay | Admitting: Family Medicine

## 2021-07-13 ENCOUNTER — Telehealth: Payer: Self-pay | Admitting: Family

## 2021-07-13 DIAGNOSIS — U071 COVID-19: Secondary | ICD-10-CM

## 2021-07-13 LAB — SARS-COV-2, NAA 2 DAY TAT

## 2021-07-13 LAB — NOVEL CORONAVIRUS, NAA: SARS-CoV-2, NAA: DETECTED — AB

## 2021-07-13 MED ORDER — MOLNUPIRAVIR EUA 200MG CAPSULE: 4.0000 | ORAL_CAPSULE | Freq: Two times a day (BID) | ORAL | 0 refills | Status: AC

## 2021-07-13 NOTE — Telephone Encounter (Signed)
  Prescription Request  07/13/2021  Is this a "Controlled Substance" medicine? no Have you seen your PCP in the last 2 weeks? yes If YES, route message to pool  -  If NO, patient needs to be scheduled for appointment.  What is the name of the medication or equipment? Pt had appt yesterday with covid symptoms and she wants something called into pharmacy  Have you contacted your pharmacy to request a refill?   Which pharmacy would you like this sent to? [facility]   Patient notified that their request is being sent to the clinical staff for review and that they should receive a response within 2 business days.

## 2021-07-13 NOTE — Telephone Encounter (Signed)
Her symptoms were consistent with a viral illness. This is symptom management. COVID test is still pending, so I cannot prescribe antivirals until it comes back positive.

## 2021-07-13 NOTE — Telephone Encounter (Signed)
Patient aware and verbalizes understanding. 

## 2021-07-14 ENCOUNTER — Encounter (HOSPITAL_COMMUNITY): Payer: Self-pay

## 2021-07-14 ENCOUNTER — Other Ambulatory Visit: Payer: Self-pay

## 2021-07-14 ENCOUNTER — Emergency Department (HOSPITAL_COMMUNITY)
Admission: EM | Admit: 2021-07-14 | Discharge: 2021-07-14 | Disposition: A | Discharge status: Home / Self Care | Payer: Medicare Other | Attending: Emergency Medicine | Admitting: Emergency Medicine

## 2021-07-14 ENCOUNTER — Emergency Department (HOSPITAL_COMMUNITY): Payer: Medicare Other

## 2021-07-14 DIAGNOSIS — I10 Essential (primary) hypertension: Secondary | ICD-10-CM | POA: Diagnosis not present

## 2021-07-14 DIAGNOSIS — E119 Type 2 diabetes mellitus without complications: Secondary | ICD-10-CM | POA: Diagnosis not present

## 2021-07-14 DIAGNOSIS — Z87891 Personal history of nicotine dependence: Secondary | ICD-10-CM | POA: Insufficient documentation

## 2021-07-14 DIAGNOSIS — U071 COVID-19: Secondary | ICD-10-CM | POA: Diagnosis not present

## 2021-07-14 DIAGNOSIS — R110 Nausea: Secondary | ICD-10-CM | POA: Diagnosis not present

## 2021-07-14 DIAGNOSIS — Z79899 Other long term (current) drug therapy: Secondary | ICD-10-CM | POA: Insufficient documentation

## 2021-07-14 DIAGNOSIS — R112 Nausea with vomiting, unspecified: Secondary | ICD-10-CM | POA: Diagnosis not present

## 2021-07-14 DIAGNOSIS — J9811 Atelectasis: Secondary | ICD-10-CM | POA: Diagnosis not present

## 2021-07-14 DIAGNOSIS — R509 Fever, unspecified: Secondary | ICD-10-CM | POA: Diagnosis not present

## 2021-07-14 DIAGNOSIS — R059 Cough, unspecified: Secondary | ICD-10-CM | POA: Diagnosis not present

## 2021-07-14 LAB — COMPREHENSIVE METABOLIC PANEL
ALT: 20 U/L (ref 0–44)
AST: 25 U/L (ref 15–41)
Albumin: 3.5 g/dL (ref 3.5–5.0)
Alkaline Phosphatase: 57 U/L (ref 38–126)
Anion gap: 9 (ref 5–15)
BUN: 6 mg/dL — ABNORMAL LOW (ref 8–23)
CO2: 28 mmol/L (ref 22–32)
Calcium: 8.7 mg/dL — ABNORMAL LOW (ref 8.9–10.3)
Chloride: 94 mmol/L — ABNORMAL LOW (ref 98–111)
Creatinine, Ser: 0.82 mg/dL (ref 0.44–1.00)
GFR, Estimated: >60 mL/min (ref >60)
Glucose, Bld: 123 mg/dL — ABNORMAL HIGH (ref 70–99)
Potassium: 3 mmol/L — ABNORMAL LOW (ref 3.5–5.1)
Sodium: 131 mmol/L — ABNORMAL LOW (ref 135–145)
Total Bilirubin: 0.3 mg/dL (ref 0.3–1.2)
Total Protein: 6.8 g/dL (ref 6.5–8.1)

## 2021-07-14 LAB — CBC WITH DIFFERENTIAL/PLATELET
Abs Immature Granulocytes: 0.03 10*3/uL (ref 0.00–0.07)
Basophils Absolute: 0 10*3/uL (ref 0.0–0.1)
Basophils Relative: 1 %
Eosinophils Absolute: 0 10*3/uL (ref 0.0–0.5)
Eosinophils Relative: 0 %
HCT: 36.7 % (ref 36.0–46.0)
Hemoglobin: 11.5 g/dL — ABNORMAL LOW (ref 12.0–15.0)
Immature Granulocytes: 1 %
Lymphocytes Relative: 28 %
Lymphs Abs: 1.6 10*3/uL (ref 0.7–4.0)
MCH: 29.6 pg (ref 26.0–34.0)
MCHC: 31.3 g/dL (ref 30.0–36.0)
MCV: 94.3 fL (ref 80.0–100.0)
Monocytes Absolute: 0.7 10*3/uL (ref 0.1–1.0)
Monocytes Relative: 11 %
Neutro Abs: 3.5 10*3/uL (ref 1.7–7.7)
Neutrophils Relative %: 59 %
Platelets: 199 10*3/uL (ref 150–400)
RBC: 3.89 MIL/uL (ref 3.87–5.11)
RDW: 13.7 % (ref 11.5–15.5)
WBC: 5.8 10*3/uL (ref 4.0–10.5)
nRBC: 0 % (ref 0.0–0.2)

## 2021-07-14 LAB — URINALYSIS, ROUTINE W REFLEX MICROSCOPIC
Bilirubin Urine: NEGATIVE
Glucose, UA: NEGATIVE mg/dL
Hgb urine dipstick: NEGATIVE
Ketones, ur: 5 mg/dL — AB
Leukocytes,Ua: NEGATIVE
Nitrite: NEGATIVE
Protein, ur: NEGATIVE mg/dL
Specific Gravity, Urine: 1.021 (ref 1.005–1.030)
pH: 6 (ref 5.0–8.0)

## 2021-07-14 MED ORDER — ONDANSETRON 4 MG PO TBDP: 4.0000 mg | ORAL_TABLET | Freq: Three times a day (TID) | ORAL | 0 refills | Status: DC | PRN

## 2021-07-14 MED ORDER — LACTATED RINGERS IV BOLUS
1000.0000 mL | Freq: Once | INTRAVENOUS | Status: AC
  Administered 2021-07-14: 1000 mL via INTRAVENOUS

## 2021-07-14 MED ORDER — POTASSIUM CHLORIDE CRYS ER 20 MEQ PO TBCR
40.0000 meq | EXTENDED_RELEASE_TABLET | Freq: Once | ORAL | Status: AC
  Administered 2021-07-14: 40 meq via ORAL
  Filled 2021-07-14: qty 2

## 2021-07-14 MED ORDER — ONDANSETRON HCL 4 MG/2ML IJ SOLN
4.0000 mg | Freq: Once | INTRAMUSCULAR | Status: AC
  Administered 2021-07-14: 4 mg via INTRAVENOUS
  Filled 2021-07-14: qty 2

## 2021-07-14 NOTE — Discharge Instructions (Addendum)
Please return if you have worsening chest pain, shortness of breath, nausea despite treatment with medication.  I encourage you to eat as much as you can, the more important thing is to drink plenty of fluids.  You can take the molnupiravir that your primary care provider prescribed.  Please follow-up with your primary care doctor as needed.

## 2021-07-14 NOTE — ED Notes (Signed)
Family at [facility]

## 2021-07-14 NOTE — ED Provider Notes (Signed)
Emergency Medicine Provider Triage Evaluation Note  Stacey [name] , a 80 y.o. female  was evaluated in triage.  Pt complains of nausea, cough, low-grade fever since leaving the hospital a week ago.  Patient did have labs result tested positive for COVID-19 approximately 2 days ago.  Review of Systems  Positive: Nasal congestion, cough, nausea, fever Negative: Chest pain, shortness of breath  Physical Exam  BP (!) 151/82 (BP Location: Right Arm)   Pulse 79   Temp 97.9 F (36.6 C) (Oral)   Resp 17   SpO2 96%  Gen:   Awake, no distress   Resp:  Normal effort  MSK:   Moves extremities without difficulty  Other:    Medical Decision Making  Medically screening exam initiated at 11:25 AM.  Appropriate orders placed.  Stacey [name] was informed that the remainder of the evaluation will be completed by another provider, this initial triage assessment does not replace that evaluation, and the importance of remaining in the ED until their evaluation is complete.  Covid +2 days ago  Ordered labs along with chest xray to R/O pneumonia.    Stacey Manges, PA-C 07/14/21 1126    Gerhard Munch, MD 07/14/21 [phone]

## 2021-07-14 NOTE — ED Triage Notes (Signed)
Patient complains of nausea, cough and low grade fever since leaving hospital 1 week ago

## 2021-07-14 NOTE — ED Provider Notes (Signed)
[facility] EMERGENCY DEPARTMENT Provider Note   CSN: [id number] Arrival date & time: 07/14/21  1039     History No chief complaint on file.   Stacey [name] is a 80 y.o. female with past medical history significant for hypertension, hyperlipidemia, diabetes, small bowel obstruction who presents after being discharged on 07/07/2021 for a early small bowel obstruction.  Patient was managed conservatively with NG tube placement, with improvement of symptoms on 9/30.  Patient tolerated oral diet, patient has had bowel movements and is passing gas.  Patient reports after being discharged on Saturday, she began having some sore throat, low-grade fever, nausea, vomiting without diarrhea or constipation on Tuesday.  Patient took an at home test which was positive for COVID on Thursday.  Patient denies chest pain, shortness of breath, headache.  Patient has taken nothing for pain or nausea at home at this time.  HPI     Past Medical History:  Diagnosis Date   Arthritis    "knees" (05/27/2013)   Bowel obstruction (HCC)    Chronic lower back pain    "bulging discs" (05/27/2013)   GERD (gastroesophageal reflux disease)    Headache(784.0)    "maybe once/wk' (05/27/2013)   Hyperlipidemia    Hypertension     Patient Active Problem List   Diagnosis Date Noted   Small bowel obstruction (HCC) 07/05/2021   Small bowel obstruction due to adhesions (HCC) 07/04/2021   Leukocytosis 07/04/2021   Prediabetes 09/24/2019   GAD (generalized anxiety disorder) 09/16/2019   Osteoarthritis 08/11/2017   Back pain 08/11/2017   Hyperlipemia 02/06/2017   Morbid obesity (HCC) 05/09/2016   Metabolic syndrome 12/28/2015   Vitamin D deficiency 01/04/2015   Bilateral lower extremity edema 02/23/2014   Heart palpitations 05/27/2013   Hypertension 09/24/2011   Hypokalemia 09/24/2011   Gastroesophageal reflux disease 09/24/2011    Past Surgical History:  Procedure Laterality Date    ABDOMINAL HYSTERECTOMY  1980's   APPENDECTOMY  1980's   BILATERAL OOPHORECTOMY Bilateral 2000   COLON SURGERY  10/09/2003   small bowel resection/notes 10/09/2003 (05/27/2013)   INGUINAL HERNIA REPAIR  10/09/2003; 2006   Laparotomy with extensive lysis of adhesions,/notes 10/09/2003 (05/27/2013);      OB History   No obstetric history on file.     Family History  Problem Relation Age of Onset   Heart disease Mother    Stroke Father    Hypertension Sister    Hypertension Brother    Hypertension Sister    Hypertension Sister    Hypertension Brother    Hypertension Brother     Social History   Tobacco Use   Smoking status: Former    Packs/day: 0.25    Years: 25.00    Pack years: 6.25    Types: Cigarettes   Smokeless tobacco: Never  Vaping Use   Vaping Use: Never used  Substance Use Topics   Alcohol use: No   Drug use: No    Home Medications Prior to Admission medications   Medication Sig Start Date End Date Taking? Authorizing Provider  acetaminophen (TYLENOL) 325 MG tablet Take 650 mg by mouth every 6 (six) hours as needed for moderate pain or fever.   Yes [provider]  atorvastatin (LIPITOR) 20 MG tablet Take 1 tablet (20 mg total) by mouth daily. 05/31/21  Yes Hawks, Christy A, FNP  fish oil-omega-3 fatty acids 1000 MG capsule Take 1 g by mouth 2 (two) times daily.   Yes [provider]  hydrochlorothiazide (HYDRODIURIL) 25 MG tablet Take 1 tablet (25 mg total) by mouth daily. 05/31/21  Yes Hawks, Christy A, FNP  loratadine (CLARITIN) 10 MG tablet Take 10 mg by mouth daily.   Yes [provider]  Multiple Vitamin (MULITIVITAMIN WITH MINERALS) TABS Take 1 tablet by mouth daily.   Yes [provider]  ondansetron (ZOFRAN ODT) 4 MG disintegrating tablet Take 1 tablet (4 mg total) by mouth every 8 (eight) hours as needed for nausea or vomiting. 07/14/21  Yes Neyra Pettie H, PA-C  oxymetazoline (AFRIN) 0.05 % nasal spray Place 2 sprays  into both nostrils 2 (two) times daily as needed for congestion.   Yes [provider]  molnupiravir EUA (LAGEVRIO) 200 mg CAPS capsule Take 4 capsules (800 mg total) by mouth 2 (two) times daily for 5 days. Patient not taking: Reported on 07/14/2021 07/13/21 07/18/21  Gwenlyn Fudge, FNP    Allergies    Clindamycin/lincomycin, Colchicine, Other, Phenergan [promethazine hcl], and Statins  Review of Systems   Review of Systems  Respiratory:  Negative for chest tightness and shortness of breath.   Cardiovascular:  Negative for chest pain.  Gastrointestinal:  Positive for nausea and vomiting. Negative for constipation and diarrhea.  All other systems reviewed and are negative.  Physical Exam Updated Vital Signs BP 139/82   Pulse 70   Temp 98.3 F (36.8 C) (Oral)   Resp 20   SpO2 (!) 88%   Physical Exam Vitals and nursing note reviewed.  Constitutional:      General: She is not in acute distress.    Appearance: Normal appearance.  HENT:     Head: Normocephalic and atraumatic.  Eyes:     General:        Right eye: No discharge.        Left eye: No discharge.  Cardiovascular:     Rate and Rhythm: Normal rate and regular rhythm.     Heart sounds: No murmur heard.   No friction rub. No gallop.  Pulmonary:     Effort: Pulmonary effort is normal.     Breath sounds: Normal breath sounds.     Comments: No respiratory distress. Abdominal:     General: Bowel sounds are normal.     Palpations: Abdomen is soft.     Tenderness: There is no abdominal tenderness.  Skin:    General: Skin is warm and dry.     Capillary Refill: Capillary refill takes less than 2 seconds.  Neurological:     Mental Status: She is alert and oriented to person, place, and time.  Psychiatric:        Mood and Affect: Mood normal.        Behavior: Behavior normal.    ED Results / Procedures / Treatments   Labs (all labs ordered are listed, but only abnormal results are displayed) Labs Reviewed   CBC WITH DIFFERENTIAL/PLATELET - Abnormal; Notable for the following components:      Result Value   Hemoglobin 11.5 (*)    All other components within normal limits  COMPREHENSIVE METABOLIC PANEL - Abnormal; Notable for the following components:   Sodium 131 (*)    Potassium 3.0 (*)    Chloride 94 (*)    Glucose, Bld 123 (*)    BUN 6 (*)    Calcium 8.7 (*)    All other components within normal limits  URINALYSIS, ROUTINE W REFLEX MICROSCOPIC - Abnormal; Notable for the following components:   Ketones, ur 5 (*)  All other components within normal limits    EKG None  Radiology DG Chest 2 View  Result Date: 07/14/2021 CLINICAL DATA:  COVID-19 positive, nausea, cough, and low-grade fever since leaving hospital 1 week ago EXAM: CHEST - 2 VIEW COMPARISON:  06/10/2013 FINDINGS: Enlargement of cardiac silhouette. Mediastinal contours and pulmonary vascularity normal. Chronic interstitial prominence with minimal bibasilar atelectasis. No pulmonary infiltrate, pleural effusion, or pneumothorax. Bones demineralized. IMPRESSION: Enlargement of cardiac silhouette with minimal bibasilar atelectasis. Electronically Signed   By: Ulyses Southward M.D.   On: 07/14/2021 12:15    Procedures Procedures   Medications Ordered in ED Medications  lactated ringers bolus 1,000 mL (1,000 mLs Intravenous New Bag/Given 07/14/21 1749)  ondansetron (ZOFRAN) injection 4 mg (4 mg Intravenous Given 07/14/21 1748)  potassium chloride SA (KLOR-CON) CR tablet 40 mEq (40 mEq Oral Given 07/14/21 1756)    ED Course  I have reviewed the triage vital signs and the nursing notes.  Pertinent labs & imaging results that were available during my care of the patient were reviewed by me and considered in my medical decision making (see chart for details).    MDM Rules/Calculators/A&P                         I discussed this case with my attending physician who cosigned this note including patient's presenting symptoms,  physical exam, and planned diagnostics and interventions. Attending physician stated agreement with plan or made changes to plan which were implemented.   Attending physician assessed patient at bedside.  Initial work-up was notable for mild hyponatremia, hypokalemia.  No evidence of elevated white blood cell count.  Chest x-ray is negative for overlying pneumonia.  Patient without respiratory distress, chest pain.  Patient does report that she is not had an appetite for the last 2 days, has not been taking in much food or fluids.  In context of nausea, vomiting, poor intake over the last 2 days we will begin with LR bolus, Zofran, and potassium repletion.  Patient was already started on Molnupiravir yesterday by PCP.  Anticipate dispo after fluids, nausea control with outpatient prescription for Zofran.  Patient has had some transient decreased SPO2 during her stay in the emergency department however she has been sitting at between 93 to 96% for the majority of the time that she has been here.  Patient has continued to have no respiratory distress, no wheezing, no rhonchi, no evidence of pneumonia on chest x-ray.  Do not feel that transient SPO2 readings reflect clinical hypoxia.  Extensive return precautions given, patient discharged stable condition. Final Clinical Impression(s) / ED Diagnoses Final diagnoses:  COVID-19  Nausea    Rx / DC Orders ED Discharge Orders          Ordered    ondansetron (ZOFRAN ODT) 4 MG disintegrating tablet  Every 8 hours PRN        07/14/21 1825             Olene Floss, PA-C 07/14/21 1829    Wynetta Fines, MD 07/16/21 2318

## 2021-07-14 NOTE — ED Notes (Signed)
Called pt x2 for vitals, no response. °

## 2021-07-17 ENCOUNTER — Other Ambulatory Visit: Payer: Self-pay

## 2021-07-17 ENCOUNTER — Emergency Department (HOSPITAL_COMMUNITY): Payer: Medicare Other

## 2021-07-17 ENCOUNTER — Emergency Department (HOSPITAL_COMMUNITY)
Admission: EM | Admit: 2021-07-17 | Discharge: 2021-07-18 | Disposition: A | Discharge status: Home / Self Care | Payer: Medicare Other | Attending: Emergency Medicine | Admitting: Emergency Medicine

## 2021-07-17 ENCOUNTER — Encounter (HOSPITAL_COMMUNITY): Payer: Self-pay | Admitting: Pharmacy Technician

## 2021-07-17 DIAGNOSIS — U071 COVID-19: Secondary | ICD-10-CM | POA: Diagnosis not present

## 2021-07-17 DIAGNOSIS — Z79899 Other long term (current) drug therapy: Secondary | ICD-10-CM | POA: Diagnosis not present

## 2021-07-17 DIAGNOSIS — R059 Cough, unspecified: Secondary | ICD-10-CM

## 2021-07-17 DIAGNOSIS — R531 Weakness: Secondary | ICD-10-CM | POA: Insufficient documentation

## 2021-07-17 DIAGNOSIS — I10 Essential (primary) hypertension: Secondary | ICD-10-CM | POA: Insufficient documentation

## 2021-07-17 DIAGNOSIS — Z87891 Personal history of nicotine dependence: Secondary | ICD-10-CM | POA: Insufficient documentation

## 2021-07-17 DIAGNOSIS — J9811 Atelectasis: Secondary | ICD-10-CM | POA: Diagnosis not present

## 2021-07-17 DIAGNOSIS — I517 Cardiomegaly: Secondary | ICD-10-CM | POA: Diagnosis not present

## 2021-07-17 LAB — COMPREHENSIVE METABOLIC PANEL
ALT: 18 U/L (ref 0–44)
AST: 25 U/L (ref 15–41)
Albumin: 3.6 g/dL (ref 3.5–5.0)
Alkaline Phosphatase: 55 U/L (ref 38–126)
Anion gap: 12 (ref 5–15)
BUN: 5 mg/dL — ABNORMAL LOW (ref 8–23)
CO2: 30 mmol/L (ref 22–32)
Calcium: 9 mg/dL (ref 8.9–10.3)
Chloride: 92 mmol/L — ABNORMAL LOW (ref 98–111)
Creatinine, Ser: 0.87 mg/dL (ref 0.44–1.00)
GFR, Estimated: >60 mL/min (ref >60)
Glucose, Bld: 130 mg/dL — ABNORMAL HIGH (ref 70–99)
Potassium: 3.2 mmol/L — ABNORMAL LOW (ref 3.5–5.1)
Sodium: 134 mmol/L — ABNORMAL LOW (ref 135–145)
Total Bilirubin: 0.4 mg/dL (ref 0.3–1.2)
Total Protein: 6.8 g/dL (ref 6.5–8.1)

## 2021-07-17 LAB — CBC WITH DIFFERENTIAL/PLATELET
Abs Immature Granulocytes: 0.02 10*3/uL (ref 0.00–0.07)
Basophils Absolute: 0 10*3/uL (ref 0.0–0.1)
Basophils Relative: 0 %
Eosinophils Absolute: 0 10*3/uL (ref 0.0–0.5)
Eosinophils Relative: 0 %
HCT: 37.4 % (ref 36.0–46.0)
Hemoglobin: 12.2 g/dL (ref 12.0–15.0)
Immature Granulocytes: 0 %
Lymphocytes Relative: 42 %
Lymphs Abs: 2 10*3/uL (ref 0.7–4.0)
MCH: 29.9 pg (ref 26.0–34.0)
MCHC: 32.6 g/dL (ref 30.0–36.0)
MCV: 91.7 fL (ref 80.0–100.0)
Monocytes Absolute: 0.3 10*3/uL (ref 0.1–1.0)
Monocytes Relative: 7 %
Neutro Abs: 2.4 10*3/uL (ref 1.7–7.7)
Neutrophils Relative %: 51 %
Platelets: 209 10*3/uL (ref 150–400)
RBC: 4.08 MIL/uL (ref 3.87–5.11)
RDW: 13.3 % (ref 11.5–15.5)
WBC: 4.8 10*3/uL (ref 4.0–10.5)
nRBC: 0 % (ref 0.0–0.2)

## 2021-07-17 NOTE — ED Triage Notes (Signed)
Pt here with reports of ongoing cough and weakness. Pt diagnosed with covid 5 days ago. Denies chest pain, shob.

## 2021-07-17 NOTE — ED Provider Notes (Signed)
Emergency Medicine Provider Triage Evaluation Note  DAKARI STABLER , a 80 y.o. female  was evaluated in triage.  Pt complains of weakness and decreased po intake. Dx with covid last week but sxs worse. Denies cp or sob.  Review of Systems  Positive: Weakness, decreased po intake Negative: Chest pain, sob  Physical Exam  BP (!) 126/93 (BP Location: Left Arm)   Pulse 79   Temp 98.5 F (36.9 C) (Oral)   Resp 18   SpO2 93%  Gen:   Awake, no distress   Resp:  Normal effort  MSK:   Moves extremities without difficulty  Other:  Lungs ctab, heart rrr  Medical Decision Making  Medically screening exam initiated at 4:57 PM.  Appropriate orders placed.  [name] was informed that the remainder of the evaluation will be completed by another provider, this initial triage assessment does not replace that evaluation, and the importance of remaining in the ED until their evaluation is complete.     Karrie Meres, PA-C 07/17/21 1658    Maia Plan, MD 07/17/21 2351

## 2021-07-18 DIAGNOSIS — R531 Weakness: Secondary | ICD-10-CM | POA: Diagnosis not present

## 2021-07-18 DIAGNOSIS — R0789 Other chest pain: Secondary | ICD-10-CM | POA: Diagnosis not present

## 2021-07-18 LAB — URINALYSIS, ROUTINE W REFLEX MICROSCOPIC
Bilirubin Urine: NEGATIVE
Glucose, UA: NEGATIVE mg/dL
Hgb urine dipstick: NEGATIVE
Ketones, ur: NEGATIVE mg/dL
Nitrite: NEGATIVE
Protein, ur: NEGATIVE mg/dL
Specific Gravity, Urine: 1.004 — ABNORMAL LOW (ref 1.005–1.030)
pH: 7 (ref 5.0–8.0)

## 2021-07-18 MED ORDER — CLASSICS ROLLING WALKER MISC: 0 refills | Status: DC

## 2021-07-18 MED ORDER — SODIUM CHLORIDE 0.9 % IV BOLUS
1000.0000 mL | Freq: Once | INTRAVENOUS | Status: AC
  Administered 2021-07-18: 1000 mL via INTRAVENOUS

## 2021-07-18 MED ORDER — ONDANSETRON 4 MG PO TBDP: 4.0000 mg | ORAL_TABLET | Freq: Three times a day (TID) | ORAL | 0 refills | Status: DC | PRN

## 2021-07-18 MED ORDER — ONDANSETRON HCL 4 MG/2ML IJ SOLN
4.0000 mg | Freq: Once | INTRAMUSCULAR | Status: AC
  Administered 2021-07-18: 4 mg via INTRAVENOUS
  Filled 2021-07-18: qty 2

## 2021-07-18 NOTE — Discharge Instructions (Signed)
Finish your antivirals.  Continue to push fluids and try to eat at least 3x daily, even small meals will help. Use the walker when needed if feeling weak/fatigued. Follow-up with your primary care doctor. Return here for new/acute changes.

## 2021-07-18 NOTE — ED Provider Notes (Signed)
[facility] EMERGENCY DEPARTMENT Provider Note   CSN: [id number] Arrival date & time: 07/17/21  1644     History Chief Complaint  Patient presents with   Weakness    Stacey [name] is a 80 y.o. female.  The history is provided by the patient and medical records.  Weakness  80 year old female with history of GERD, hyperlipidemia, hypertension, presenting to the ED with complaint of "still feeling bad".  Patient diagnosed COVID-positive about 5 days ago, started molnupiravir on Sunday.  States she has been drinking fluids but is not able to eat because of the ongoing nausea.  States she has been taking zofran but is not sure it is working for her.  States she continues to feel weak.  Denies syncopal events.  States she is worried about being weak since she lives alone.  She denies chest pain or SOB at present.  Past Medical History:  Diagnosis Date   Arthritis    "knees" (05/27/2013)   Bowel obstruction (HCC)    Chronic lower back pain    "bulging discs" (05/27/2013)   GERD (gastroesophageal reflux disease)    Headache(784.0)    "maybe once/wk' (05/27/2013)   Hyperlipidemia    Hypertension     Patient Active Problem List   Diagnosis Date Noted   Small bowel obstruction (HCC) 07/05/2021   Small bowel obstruction due to adhesions (HCC) 07/04/2021   Leukocytosis 07/04/2021   Prediabetes 09/24/2019   GAD (generalized anxiety disorder) 09/16/2019   Osteoarthritis 08/11/2017   Back pain 08/11/2017   Hyperlipemia 02/06/2017   Morbid obesity (HCC) 05/09/2016   Metabolic syndrome 12/28/2015   Vitamin D deficiency 01/04/2015   Bilateral lower extremity edema 02/23/2014   Heart palpitations 05/27/2013   Hypertension 09/24/2011   Hypokalemia 09/24/2011   Gastroesophageal reflux disease 09/24/2011    Past Surgical History:  Procedure Laterality Date   ABDOMINAL HYSTERECTOMY  1980's   APPENDECTOMY  1980's   BILATERAL OOPHORECTOMY Bilateral 2000   COLON  SURGERY  10/09/2003   small bowel resection/notes 10/09/2003 (05/27/2013)   INGUINAL HERNIA REPAIR  10/09/2003; 2006   Laparotomy with extensive lysis of adhesions,/notes 10/09/2003 (05/27/2013);      OB History   No obstetric history on file.     Family History  Problem Relation Age of Onset   Heart disease Mother    Stroke Father    Hypertension Sister    Hypertension Brother    Hypertension Sister    Hypertension Sister    Hypertension Brother    Hypertension Brother     Social History   Tobacco Use   Smoking status: Former    Packs/day: 0.25    Years: 25.00    Pack years: 6.25    Types: Cigarettes   Smokeless tobacco: Never  Vaping Use   Vaping Use: Never used  Substance Use Topics   Alcohol use: No   Drug use: No    Home Medications Prior to Admission medications   Medication Sig Start Date End Date Taking? Authorizing Provider  Misc. Devices (CLASSICS ROLLING WALKER) MISC 1 rolling walker for home use 07/18/21  Yes Garlon Hatchet, PA-C  ondansetron (ZOFRAN ODT) 4 MG disintegrating tablet Take 1 tablet (4 mg total) by mouth every 8 (eight) hours as needed for nausea. 07/18/21  Yes Garlon Hatchet, PA-C  acetaminophen (TYLENOL) 325 MG tablet Take 650 mg by mouth every 6 (six) hours as needed for moderate pain or fever.    [provider]  atorvastatin (LIPITOR) 20 MG tablet Take 1 tablet (20 mg total) by mouth daily. 05/31/21   [name], FNP  fish oil-omega-3 fatty acids 1000 MG capsule Take 1 g by mouth 2 (two) times daily.    [provider]  hydrochlorothiazide (HYDRODIURIL) 25 MG tablet Take 1 tablet (25 mg total) by mouth daily. 05/31/21   [name], FNP  loratadine (CLARITIN) 10 MG tablet Take 10 mg by mouth daily.    [provider]  molnupiravir EUA (LAGEVRIO) 200 mg CAPS capsule Take 4 capsules (800 mg total) by mouth 2 (two) times daily for 5 days. Patient not taking: Reported on 07/14/2021 07/13/21 07/18/21  Gwenlyn Fudge, FNP  Multiple Vitamin (MULITIVITAMIN WITH MINERALS) TABS Take 1 tablet by mouth daily.    [provider]  oxymetazoline (AFRIN) 0.05 % nasal spray Place 2 sprays into both nostrils 2 (two) times daily as needed for congestion.    [provider]    Allergies    Clindamycin/lincomycin, Colchicine, Other, Phenergan [promethazine hcl], and Statins  Review of Systems   Review of Systems  Neurological:  Positive for weakness.  All other systems reviewed and are negative.  Physical Exam Updated Vital Signs BP 115/69   Pulse 65   Temp 98.5 F (36.9 C) (Oral)   Resp 20   SpO2 97%   Physical Exam Vitals and nursing note reviewed.  Constitutional:      Appearance: She is well-developed.  HENT:     Head: Normocephalic and atraumatic.     Mouth/Throat:     Comments: Moist mucous membranes Eyes:     General: No scleral icterus.       Right eye: No discharge.     Conjunctiva/sclera: Conjunctivae normal.     Pupils: Pupils are equal, round, and reactive to light.  Cardiovascular:     Rate and Rhythm: Normal rate and regular rhythm.     Heart sounds: Normal heart sounds.  Pulmonary:     Effort: Pulmonary effort is normal.     Breath sounds: Normal breath sounds.     Comments: Faint dry cough, lungs CTAB, O2 sats 95% throughout exam, no apparent SOB during exam with fluid conversation Abdominal:     General: Bowel sounds are normal.     Palpations: Abdomen is soft.  Musculoskeletal:        General: Normal range of motion.     Cervical back: Normal range of motion.  Skin:    General: Skin is warm and dry.  Neurological:     Mental Status: She is alert and oriented to person, place, and time.    ED Results / Procedures / Treatments   Labs (all labs ordered are listed, but only abnormal results are displayed) Labs Reviewed  COMPREHENSIVE METABOLIC PANEL - Abnormal; Notable for the following components:      Result Value   Sodium 134 (*)    Potassium 3.2  (*)    Chloride 92 (*)    Glucose, Bld 130 (*)    BUN 5 (*)    All other components within normal limits  URINALYSIS, ROUTINE W REFLEX MICROSCOPIC - Abnormal; Notable for the following components:   Specific Gravity, Urine 1.004 (*)    Leukocytes,Ua TRACE (*)    Bacteria, UA RARE (*)    All other components within normal limits  CBC WITH DIFFERENTIAL/PLATELET    EKG EKG Interpretation  Date/Time:  Tuesday July 17 2021 16:56:26 EDT Ventricular Rate:  25  PR Interval:  190 QRS Duration: 78 QT Interval:  412 QTC Calculation: 481 R Axis:   70 Text Interpretation: Sinus rhythm with occasional Premature ventricular complexes Otherwise normal ECG Confirmed by Gilda Crease [phone]) on 07/18/2021 1:59:16 AM  Radiology DG Chest 1 View  Result Date: 07/17/2021 CLINICAL DATA:  COVID positive with cough and weakness. EXAM: CHEST  1 VIEW COMPARISON:  July 14, 2021 FINDINGS: Mild, diffusely increased lung markings are seen with mild, stable areas of bibasilar atelectasis. There is no evidence of a pleural effusion or pneumothorax. The cardiac silhouette is enlarged and unchanged in size. Degenerative changes are seen throughout the thoracic spine. IMPRESSION: Stable cardiomegaly with mild, stable bibasilar atelectasis. Electronically Signed   By: Aram Candela M.D.   On: 07/17/2021 18:32    Procedures Procedures   Medications Ordered in ED Medications  sodium chloride 0.9 % bolus 1,000 mL (0 mLs Intravenous Stopped 07/18/21 0452)  ondansetron (ZOFRAN) injection 4 mg (4 mg Intravenous Given 07/18/21 0235)    ED Course  I have reviewed the triage vital signs and the nursing notes.  Pertinent labs & imaging results that were available during my care of the patient were reviewed by me and considered in my medical decision making (see chart for details).    MDM Rules/Calculators/A&P                           80 year old female presenting to the ED with generalized  weakness.  She was diagnosed with COVID 5 days ago and seen in the ED 4 days ago for same.  She has not had any syncopal events or falls but states she just feels weak.  She is drinking fluids but is not really wanting to eat because of nausea.  States she is nervous since she lives alone and wanted to be admitted.  She is afebrile and nontoxic.  It appears on arrival she had a transiently low saturation of 88%, however within 1 minute this corrected to normal.  Saturations have remained within normal limits since this time (over 13+ hours).  She denies any chest pain or shortness of breath.  Chest x-ray is clear.  Her labs are reassuring without any signs of significant dehydration.  UA without signs of infection.  After treatment with IV fluids and Zofran, states she is feeling better.  She has been able to get up in room and maneuver from bed to bedside commode several times on her own without issue.  Vitals remained stable.  At this point, medically no indication for admission which I discussed with her.  Her son has come to the ED and will help check in on her.  Have also written her for rolling walker to help temporarily when up and about in case she starts to feel weak and for fall prevention.  Will continue to push oral fluids and try to eat at least 3x daily.  Close follow-up with PCP.  Return here for new concerns.  Final Clinical Impression(s) / ED Diagnoses Final diagnoses:  Weakness    Rx / DC Orders ED Discharge Orders          Ordered    ondansetron (ZOFRAN ODT) 4 MG disintegrating tablet  Every 8 hours PRN        07/18/21 0549    Misc. Devices (CLASSICS ROLLING WALKER) MISC        07/18/21 [phone]  Garlon Hatchet, PA-C 07/18/21 0600    Gilda Crease, MD 07/18/21 [phone]

## 2021-07-25 ENCOUNTER — Telehealth: Payer: Self-pay | Admitting: Family

## 2021-07-25 NOTE — Telephone Encounter (Signed)
Advised patient that she needs to be seen for an ER follow up so that we can recheck her levels.  Tried to schedule an appointment with Neysa Bonito but patient wants to talk to son first to see when he can bring her.  She will call back.

## 2021-07-25 NOTE — Telephone Encounter (Signed)
Spoke with patient, appointment scheduled with Jannifer Rodney on 07/26/21 at 11:40 am.

## 2021-07-26 ENCOUNTER — Other Ambulatory Visit: Payer: Self-pay

## 2021-07-26 ENCOUNTER — Ambulatory Visit (INDEPENDENT_AMBULATORY_CARE_PROVIDER_SITE_OTHER): Payer: Medicare Other | Admitting: Family

## 2021-07-26 ENCOUNTER — Encounter: Payer: Self-pay | Admitting: Family

## 2021-07-26 VITALS — BP 135/82 | HR 95 | Temp 97.6°F | Ht 63.0 in | Wt 179.2 lb

## 2021-07-26 DIAGNOSIS — Z8616 Personal history of COVID-19: Secondary | ICD-10-CM | POA: Diagnosis not present

## 2021-07-26 DIAGNOSIS — R531 Weakness: Secondary | ICD-10-CM | POA: Diagnosis not present

## 2021-07-26 DIAGNOSIS — Z09 Encounter for follow-up examination after completed treatment for conditions other than malignant neoplasm: Secondary | ICD-10-CM | POA: Diagnosis not present

## 2021-07-26 NOTE — Progress Notes (Signed)
   Subjective:    Patient ID: Stacey [name], female    DOB: Jul 22, 1941, 80 y.o.   MRN: [id number]  Chief Complaint  Patient presents with   Hospitalization Follow-up    HPI PT presents to the office today for hospital follow up. She went to the ED on 07/14/21 for COVID , nausea. She was given fluids, potassium, and zofran. She had a stable chest x-ray. She was discharged home. She went back to the ED on 07/17/21 for weakness. Had another negative chest x-ray. She was given IV fluids and Zofran again.   She continues to feel weak, but better than she was. She denies fever, SOB, or chest pain. She has intermittent nonproductive cough.    Review of Systems  All other systems reviewed and are negative.     Objective:   Physical Exam Vitals reviewed.  Constitutional:      General: She is not in acute distress.    Appearance: She is well-developed. She is obese.  HENT:     Head: Normocephalic and atraumatic.     Right Ear: Tympanic membrane normal.     Left Ear: Tympanic membrane normal.  Eyes:     Pupils: Pupils are equal, round, and reactive to light.  Neck:     Thyroid: No thyromegaly.  Cardiovascular:     Rate and Rhythm: Normal rate and regular rhythm.     Heart sounds: Normal heart sounds. No murmur heard. Pulmonary:     Effort: Pulmonary effort is normal. No respiratory distress.     Breath sounds: Normal breath sounds. No wheezing.  Abdominal:     General: Bowel sounds are normal. There is no distension.     Palpations: Abdomen is soft.     Tenderness: There is no abdominal tenderness.  Musculoskeletal:        General: No tenderness. Normal range of motion.     Cervical back: Normal range of motion and neck supple.  Skin:    General: Skin is warm and dry.  Neurological:     Mental Status: She is alert and oriented to person, place, and time.     Cranial Nerves: No cranial nerve deficit.     Motor: Weakness present.     Deep Tendon Reflexes: Reflexes are normal  and symmetric.  Psychiatric:        Behavior: Behavior normal.        Thought Content: Thought content normal.        Judgment: Judgment normal.     BP 135/82   Pulse 95   Temp 97.6 F (36.4 C) (Temporal)   Ht _0  (1.6 m)   Wt 179 lb 3.2 oz (81.3 kg)   BMI 31.74 kg/m       Assessment & Plan:  Stacey [name] comes in today with chief complaint of Hospitalization Follow-up   Diagnosis and orders addressed:  1. Hospital discharge follow-up [facility] notes reviewed  - CMP14+EGFR - CBC with Differential/Platelet  2. Weakness - CMP14+EGFR - CBC with Differential/Platelet  3. History of COVID-19 - CMP14+EGFR - CBC with Differential/Platelet   Labs pending Health Maintenance reviewed Diet and exercise encouraged  Follow up plan: 3 months    Stacey Dun, FNP

## 2021-07-26 NOTE — Patient Instructions (Signed)
Weakness ?Weakness is a lack of strength. You may feel weak all over your body (generalized), or you may feel weak in one specific part of your body (focal). Common causes of weakness include: ?Infection and immune system disorders. ?Physical exhaustion. ?Internal bleeding or other blood loss that results in a lack of red blood cells (anemia). ?Dehydration. ?An imbalance in mineral (electrolyte) levels, such as potassium. ?Heart disease, circulation problems, or stroke. ?Other causes include: ?Some medicines or cancer treatment. ?Stress, anxiety, or depression. ?Nervous system disorders. ?Thyroid disorders. ?Loss of muscle strength because of age or inactivity. ?Poor sleep quality or sleep disorders. ?The cause of your weakness may not be known. Some causes of weakness can be serious, so it is important to see your health care provider. ?Follow these instructions at home: ?Activity ?Rest as needed. ?Try to get enough sleep. Most adults need 7-8 hours of quality sleep each night. Talk to your health care provider about how much sleep you need each night. ?Do exercises, such as arm curls and leg raises, for 30 minutes at least 2 days a week or as told by your health care provider. This helps build muscle strength. ?Consider working with a physical therapist or trainer who can develop an exercise plan to help you gain muscle strength. ?General instructions ? ?Take over-the-counter and prescription medicines only as told by your health care provider. ?Eat a healthy, well-balanced diet. This includes: ?Proteins to build muscles, such as lean meats and fish. ?Fresh fruits and vegetables. ?Carbohydrates to boost energy, such as whole grains. ?Drink enough fluid to keep your urine pale yellow. ?Keep all follow-up visits as told by your health care provider. This is important. ?Contact a health care provider if your weakness: ?Does not improve or gets worse. ?Affects your ability to think clearly. ?Affects your ability to do  your normal daily activities. ?Get help right away if you: ?Develop sudden weakness, especially on one side of your face or body. ?Have chest pain. ?Have trouble breathing or shortness of breath. ?Have problems with your vision. ?Have trouble talking or swallowing. ?Have trouble standing or walking. ?Are light-headed or lose consciousness. ?Summary ?Weakness is a lack of strength. You may feel weak all over your body or just in one specific part of your body. ?Weakness can be caused by a variety of things. In some cases, the cause may be unknown. ?Rest as needed, and try to get enough sleep. Most adults need 7-8 hours of quality sleep each night. ?Eat a healthy, well-balanced diet. ?This information is not intended to replace advice given to you by your health care provider. Make sure you discuss any questions you have with your health care provider. ?Document Revised: 04/29/2018 Document Reviewed: 04/29/2018 ?Elsevier Patient Education ? 2022 Elsevier Inc. ? ?

## 2021-07-27 LAB — CBC WITH DIFFERENTIAL/PLATELET
Basophils Absolute: 0 10*3/uL (ref 0.0–0.2)
Basos: 1 %
EOS (ABSOLUTE): 0.1 10*3/uL (ref 0.0–0.4)
Eos: 2 %
Hematocrit: 36.9 % (ref 34.0–46.6)
Hemoglobin: 12.2 g/dL (ref 11.1–15.9)
Immature Grans (Abs): 0 10*3/uL (ref 0.0–0.1)
Immature Granulocytes: 0 %
Lymphocytes Absolute: 2.6 10*3/uL (ref 0.7–3.1)
Lymphs: 31 %
MCH: 29.8 pg (ref 26.6–33.0)
MCHC: 33.1 g/dL (ref 31.5–35.7)
MCV: 90 fL (ref 79–97)
Monocytes Absolute: 0.5 10*3/uL (ref 0.1–0.9)
Monocytes: 6 %
Neutrophils Absolute: 5.1 10*3/uL (ref 1.4–7.0)
Neutrophils: 60 %
Platelets: 265 10*3/uL (ref 150–450)
RBC: 4.09 x10E6/uL (ref 3.77–5.28)
RDW: 12.7 % (ref 11.7–15.4)
WBC: 8.3 10*3/uL (ref 3.4–10.8)

## 2021-07-27 LAB — CMP14+EGFR
ALT: 14 IU/L (ref 0–32)
AST: 18 IU/L (ref 0–40)
Albumin/Globulin Ratio: 1.3 (ref 1.2–2.2)
Albumin: 4 g/dL (ref 3.7–4.7)
Alkaline Phosphatase: 77 IU/L (ref 44–121)
BUN/Creatinine Ratio: 13 (ref 12–28)
BUN: 11 mg/dL (ref 8–27)
Bilirubin Total: 0.2 mg/dL (ref 0.0–1.2)
CO2: 26 mmol/L (ref 20–29)
Calcium: 9.4 mg/dL (ref 8.7–10.3)
Chloride: 97 mmol/L (ref 96–106)
Creatinine, Ser: 0.82 mg/dL (ref 0.57–1.00)
Globulin, Total: 3 g/dL (ref 1.5–4.5)
Glucose: 114 mg/dL — ABNORMAL HIGH (ref 70–99)
Potassium: 3.7 mmol/L (ref 3.5–5.2)
Sodium: 140 mmol/L (ref 134–144)
Total Protein: 7 g/dL (ref 6.0–8.5)
eGFR: 73 mL/min/{1.73_m2} (ref >59)

## 2021-08-22 DIAGNOSIS — Z20828 Contact with and (suspected) exposure to other viral communicable diseases: Secondary | ICD-10-CM | POA: Diagnosis not present

## 2021-08-22 DIAGNOSIS — Z23 Encounter for immunization: Secondary | ICD-10-CM | POA: Diagnosis not present

## 2021-09-04 ENCOUNTER — Other Ambulatory Visit: Payer: Self-pay

## 2021-09-04 ENCOUNTER — Ambulatory Visit (INDEPENDENT_AMBULATORY_CARE_PROVIDER_SITE_OTHER): Payer: Medicare Other | Admitting: Family

## 2021-09-04 ENCOUNTER — Encounter: Payer: Self-pay | Admitting: Family

## 2021-09-04 VITALS — BP 135/82 | HR 86 | Temp 96.7°F | Ht 63.0 in | Wt 181.8 lb

## 2021-09-04 DIAGNOSIS — E669 Obesity, unspecified: Secondary | ICD-10-CM | POA: Diagnosis not present

## 2021-09-04 DIAGNOSIS — E559 Vitamin D deficiency, unspecified: Secondary | ICD-10-CM | POA: Diagnosis not present

## 2021-09-04 DIAGNOSIS — E785 Hyperlipidemia, unspecified: Secondary | ICD-10-CM

## 2021-09-04 DIAGNOSIS — R600 Localized edema: Secondary | ICD-10-CM | POA: Insufficient documentation

## 2021-09-04 DIAGNOSIS — R609 Edema, unspecified: Secondary | ICD-10-CM

## 2021-09-04 DIAGNOSIS — I10 Essential (primary) hypertension: Secondary | ICD-10-CM

## 2021-09-04 DIAGNOSIS — M159 Polyosteoarthritis, unspecified: Secondary | ICD-10-CM | POA: Diagnosis not present

## 2021-09-04 DIAGNOSIS — F411 Generalized anxiety disorder: Secondary | ICD-10-CM | POA: Diagnosis not present

## 2021-09-04 DIAGNOSIS — K2100 Gastro-esophageal reflux disease with esophagitis, without bleeding: Secondary | ICD-10-CM | POA: Diagnosis not present

## 2021-09-04 DIAGNOSIS — R7303 Prediabetes: Secondary | ICD-10-CM | POA: Diagnosis not present

## 2021-09-04 LAB — CBC WITH DIFFERENTIAL/PLATELET
Basophils Absolute: 0.1 10*3/uL (ref 0.0–0.2)
Basos: 1 %
EOS (ABSOLUTE): 0.3 10*3/uL (ref 0.0–0.4)
Eos: 4 %
Hematocrit: 35.7 % (ref 34.0–46.6)
Hemoglobin: 12.1 g/dL (ref 11.1–15.9)
Immature Grans (Abs): 0 10*3/uL (ref 0.0–0.1)
Immature Granulocytes: 0 %
Lymphocytes Absolute: 2.4 10*3/uL (ref 0.7–3.1)
Lymphs: 31 %
MCH: 30 pg (ref 26.6–33.0)
MCHC: 33.9 g/dL (ref 31.5–35.7)
MCV: 89 fL (ref 79–97)
Monocytes Absolute: 0.4 10*3/uL (ref 0.1–0.9)
Monocytes: 6 %
Neutrophils Absolute: 4.4 10*3/uL (ref 1.4–7.0)
Neutrophils: 58 %
Platelets: 226 10*3/uL (ref 150–450)
RBC: 4.03 x10E6/uL (ref 3.77–5.28)
RDW: 13 % (ref 11.7–15.4)
WBC: 7.7 10*3/uL (ref 3.4–10.8)

## 2021-09-04 LAB — CMP14+EGFR
ALT: 17 IU/L (ref 0–32)
AST: 23 IU/L (ref 0–40)
Albumin/Globulin Ratio: 1.7 (ref 1.2–2.2)
Albumin: 4.3 g/dL (ref 3.7–4.7)
Alkaline Phosphatase: 74 IU/L (ref 44–121)
BUN/Creatinine Ratio: 14 (ref 12–28)
BUN: 13 mg/dL (ref 8–27)
Bilirubin Total: 0.2 mg/dL (ref 0.0–1.2)
CO2: 25 mmol/L (ref 20–29)
Calcium: 9.6 mg/dL (ref 8.7–10.3)
Chloride: 100 mmol/L (ref 96–106)
Creatinine, Ser: 0.92 mg/dL (ref 0.57–1.00)
Globulin, Total: 2.6 g/dL (ref 1.5–4.5)
Glucose: 116 mg/dL — ABNORMAL HIGH (ref 70–99)
Potassium: 3.9 mmol/L (ref 3.5–5.2)
Sodium: 141 mmol/L (ref 134–144)
Total Protein: 6.9 g/dL (ref 6.0–8.5)
eGFR: 63 mL/min/{1.73_m2} (ref >59)

## 2021-09-04 NOTE — Progress Notes (Signed)
Subjective:    Patient ID: Stacey [name], female    DOB: [birth year], 80 y.o.   MRN: [id number]  Chief Complaint  Patient presents with   Medical Management of Chronic Issues   Pt presents to the office today for chronic follow up.  Hypertension This is a chronic problem. The current episode started more than 1 year ago. The problem has been resolved since onset. The problem is controlled. Associated symptoms include anxiety, malaise/fatigue and peripheral edema. Pertinent negatives include no blurred vision or shortness of breath. Risk factors for coronary artery disease include dyslipidemia and obesity. The current treatment provides moderate improvement.  Gastroesophageal Reflux She complains of belching and heartburn. This is a chronic problem. The current episode started more than 1 year ago. The problem occurs occasionally. The problem has been waxing and waning. Risk factors include obesity. She has tried a PPI for the symptoms. The treatment provided moderate relief.  Arthritis Presents for follow-up visit. She complains of pain and stiffness. The symptoms have been stable. Affected locations include the right knee, left knee, right MCP and left MCP. Her pain is at a severity of 10/10.  Hyperlipidemia This is a chronic problem. The current episode started more than 1 year ago. Exacerbating diseases include obesity. Pertinent negatives include no shortness of breath. Current antihyperlipidemic treatment includes statins. The current treatment provides moderate improvement of lipids.  Anxiety Presents for follow-up visit. Symptoms include depressed mood, irritability, nervous/anxious behavior and restlessness. Patient reports no shortness of breath. Symptoms occur most days. The severity of symptoms is moderate.    Diabetes She presents for her follow-up diabetic visit. She has type 2 diabetes mellitus. Hypoglycemia symptoms include nervousness/anxiousness. Pertinent negatives for  diabetes include no blurred vision and no foot paresthesias. Symptoms are stable. Risk factors for coronary artery disease include dyslipidemia, diabetes mellitus, hypertension, sedentary lifestyle and post-menopausal. She is following a generally healthy diet. (Does not check at home)     Review of Systems  Constitutional:  Positive for irritability and malaise/fatigue.  Eyes:  Negative for blurred vision.  Respiratory:  Negative for shortness of breath.   Gastrointestinal:  Positive for heartburn.  Musculoskeletal:  Positive for arthritis and stiffness.  Psychiatric/Behavioral:  The patient is nervous/anxious.       Objective:   Physical Exam Vitals reviewed.  Constitutional:      General: She is not in acute distress.    Appearance: She is well-developed. She is obese.  HENT:     Head: Normocephalic and atraumatic.     Right Ear: Tympanic membrane normal.     Left Ear: Tympanic membrane normal.  Eyes:     Pupils: Pupils are equal, round, and reactive to light.  Neck:     Thyroid: No thyromegaly.  Cardiovascular:     Rate and Rhythm: Normal rate and regular rhythm.     Heart sounds: Normal heart sounds. No murmur heard. Pulmonary:     Effort: Pulmonary effort is normal. No respiratory distress.     Breath sounds: Normal breath sounds. No wheezing.  Abdominal:     General: Bowel sounds are normal. There is no distension.     Palpations: Abdomen is soft.     Tenderness: There is no abdominal tenderness.  Musculoskeletal:        General: No tenderness. Normal range of motion.     Cervical back: Normal range of motion and neck supple.     Right lower leg: Edema (trace) present.  Left lower leg: Edema (3 + in ankle) present.  Skin:    General: Skin is warm and dry.  Neurological:     Mental Status: She is alert and oriented to person, place, and time.     Cranial Nerves: No cranial nerve deficit.     Deep Tendon Reflexes: Reflexes are normal and symmetric.   Psychiatric:        Behavior: Behavior normal.        Thought Content: Thought content normal.        Judgment: Judgment normal.      BP 135/82   Pulse 86   Temp (!) 96.7 F (35.9 C) (Temporal)   Ht '5\' 3"'  (1.6 m)   Wt 181 lb 12.8 oz (82.5 kg)   BMI 32.20 kg/m      Assessment & Plan:  Stacey [name] comes in today with chief complaint of Medical Management of Chronic Issues   Diagnosis and orders addressed:  1. Primary hypertension - CMP14+EGFR - CBC with Differential/Platelet  2. Gastroesophageal reflux disease with esophagitis, unspecified whether hemorrhage - CMP14+EGFR - CBC with Differential/Platelet  3. Primary osteoarthritis involving multiple joints - CMP14+EGFR - CBC with Differential/Platelet  4. Hyperlipidemia, unspecified hyperlipidemia type - CMP14+EGFR - CBC with Differential/Platelet  5. GAD (generalized anxiety disorder) - CMP14+EGFR - CBC with Differential/Platelet  6. Obesity (BMI 30-39.9) - CMP14+EGFR - CBC with Differential/Platelet  7. Vitamin D deficiency - CMP14+EGFR - CBC with Differential/Platelet  8. Prediabetes - CMP14+EGFR - CBC with Differential/Platelet  9. Peripheral edema - CMP14+EGFR - CBC with Differential/Platelet - Compression stockings   Labs pending Health Maintenance reviewed Diet and exercise encouraged  Follow up plan: 3 months    Evelina Dun, FNP

## 2021-09-04 NOTE — Patient Instructions (Signed)
Peripheral Edema °Peripheral edema is swelling that is caused by a buildup of fluid. Peripheral edema most often affects the lower legs, ankles, and feet. It can also develop in the arms, hands, and face. The area of the body that has peripheral edema will look swollen. It may also feel heavy or warm. Your clothes may start to feel tight. Pressing on the area may make a temporary dent in your skin. You may not be able to move your swollen arm or leg as much as usual. °There are many causes of peripheral edema. It can happen because of a complication of other conditions such as congestive heart failure, kidney disease, or a problem with your blood circulation. It also can be a side effect of certain medicines or because of an infection. It often happens to women during pregnancy. Sometimes, the cause is not known. °Follow these instructions at home: °Managing pain, stiffness, and swelling ° °Raise (elevate) your legs while you are sitting or lying down. °Move around often to prevent stiffness and to lessen swelling. °Do not sit or stand for long periods of time. °Wear support stockings as told by your health care provider. °Medicines °Take over-the-counter and prescription medicines only as told by your health care provider. °Your health care provider may prescribe medicine to help your body get rid of excess water (diuretic). °General instructions °Pay attention to any changes in your symptoms. °Follow instructions from your health care provider about limiting salt (sodium) in your diet. Sometimes, eating less salt may reduce swelling. °Moisturize skin daily to help prevent skin from cracking and draining. °Keep all follow-up visits as told by your health care provider. This is important. °Contact a health care provider if you have: °A fever. °Edema that starts suddenly or is getting worse, especially if you are pregnant or have a medical condition. °Swelling in only one leg. °Increased swelling, redness, or pain in  one or both of your legs. °Drainage or sores at the area where you have edema. °Get help right away if you: °Develop shortness of breath, especially when you are lying down. °Have pain in your chest or abdomen. °Feel weak. °Feel faint. °Summary °Peripheral edema is swelling that is caused by a buildup of fluid. Peripheral edema most often affects the lower legs, ankles, and feet. °Move around often to prevent stiffness and to lessen swelling. Do not sit or stand for long periods of time. °Pay attention to any changes in your symptoms. °Contact a health care provider if you have edema that starts suddenly or is getting worse, especially if you are pregnant or have a medical condition. °Get help right away if you develop shortness of breath, especially when lying down. °This information is not intended to replace advice given to you by your health care provider. Make sure you discuss any questions you have with your health care provider. °Document Revised: 02/22/2021 Document Reviewed: 06/17/2018 °Elsevier Patient Education © 2022 [employer]vier Inc. ° °

## 2021-11-08 ENCOUNTER — Ambulatory Visit (INDEPENDENT_AMBULATORY_CARE_PROVIDER_SITE_OTHER): Payer: Medicare Other | Admitting: Nurse Practitioner

## 2021-11-08 ENCOUNTER — Encounter: Payer: Self-pay | Admitting: Nurse Practitioner

## 2021-11-08 DIAGNOSIS — J0100 Acute maxillary sinusitis, unspecified: Secondary | ICD-10-CM | POA: Diagnosis not present

## 2021-11-08 MED ORDER — AMOXICILLIN-POT CLAVULANATE 875-125 MG PO TABS
1.0000 | ORAL_TABLET | Freq: Two times a day (BID) | ORAL | 0 refills | Status: DC
Start: 2021-11-08 — End: 2021-12-04

## 2021-11-08 MED ORDER — FLUCONAZOLE 150 MG PO TABS
ORAL_TABLET | ORAL | 0 refills | Status: DC
Start: 2021-11-08 — End: 2021-12-04

## 2021-11-08 NOTE — Progress Notes (Signed)
Virtual Visit  Note Due to COVID-19 pandemic this visit was conducted virtually. This visit type was conducted due to national recommendations for restrictions regarding the COVID-19 Pandemic (e.g. social distancing, sheltering in place) in an effort to limit this patient's exposure and mitigate transmission in our community. All issues noted in this document were discussed and addressed.  A physical exam was not performed with this format.  I connected with Stacey [name] on 11/08/21 at 11:22 by telephone and verified that I am speaking with the correct person using two identifiers. [name] is currently located at home and no one is currently with her during visit. The provider, Mary-Margaret Hassell Done, FNP is located in their office at time of visit.  I discussed the limitations, risks, security and privacy concerns of performing an evaluation and management service by telephone and the availability of in person appointments. I also discussed with the patient that there may be a patient responsible charge related to this service. The patient expressed understanding and agreed to proceed.   History and Present Illness:  Sinusitis The current episode started yesterday. The problem has been gradually worsening since onset. There has been no fever. Her pain is at a severity of 10/10 (gums are aching nad she has no teeth). Associated symptoms include headaches and sinus pressure. Pertinent negatives include no congestion, coughing or sore throat. Past treatments include nothing. The treatment provided no relief.     Review of Systems  HENT:  Positive for sinus pressure. Negative for congestion and sore throat.   Respiratory:  Negative for cough.   Neurological:  Positive for headaches.    Observations/Objective: Alert and oriented- answers all questions appropriately No distress No cough noted  Assessment and Plan: Stacey [name] in today with chief complaint of  Sinusitis   1. Acute non-recurrent maxillary sinusitis 1. Take meds as prescribed 2. Use a cool mist humidifier especially during the winter months and when heat has been humid. 3. Use saline nose sprays frequently 4. Saline irrigations of the nose can be very helpful if done frequently.  * 4X daily for 1 week*  * Use of a nettie pot can be helpful with this. Follow directions with this* 5. Drink plenty of fluids 6. Keep thermostat turn down low 7.For any cough or congestion- OTC meds 8. For fever or aces or pains- take tylenol or ibuprofen appropriate for age and weight.  * for fevers greater than 101 orally you may alternate ibuprofen and tylenol every  3 hours.    - amoxicillin-clavulanate (AUGMENTIN) 875-125 MG tablet; Take 1 tablet by mouth 2 (two) times daily.  Dispense: 14 tablet; Refill: 0 - fluconazole (DIFLUCAN) 150 MG tablet; 1 po q week x 4 weeks  Dispense: 2 tablet; Refill: 0    Follow Up Instructions: prn    I discussed the assessment and treatment plan with the patient. The patient was provided an opportunity to ask questions and all were answered. The patient agreed with the plan and demonstrated an understanding of the instructions.   The patient was advised to call back or seek an in-person evaluation if the symptoms worsen or if the condition fails to improve as anticipated.  The above assessment and management plan was discussed with the patient. The patient verbalized understanding of and has agreed to the management plan. Patient is aware to call the clinic if symptoms persist or worsen. Patient is aware when to return to the clinic for a follow-up visit. Patient educated  on when it is appropriate to go to the emergency department.   Time call ended:  11:35  I provided 13 minutes of  non face-to-face time during this encounter.    Mary-Margaret Hassell Done, FNP

## 2021-12-04 ENCOUNTER — Encounter: Payer: Self-pay | Admitting: Family

## 2021-12-04 ENCOUNTER — Ambulatory Visit (INDEPENDENT_AMBULATORY_CARE_PROVIDER_SITE_OTHER): Payer: Medicare Other | Admitting: Family

## 2021-12-04 VITALS — BP 131/78 | HR 87 | Temp 97.8°F | Ht 63.0 in | Wt 180.2 lb

## 2021-12-04 DIAGNOSIS — E785 Hyperlipidemia, unspecified: Secondary | ICD-10-CM

## 2021-12-04 DIAGNOSIS — M5441 Lumbago with sciatica, right side: Secondary | ICD-10-CM

## 2021-12-04 DIAGNOSIS — F411 Generalized anxiety disorder: Secondary | ICD-10-CM

## 2021-12-04 DIAGNOSIS — M159 Polyosteoarthritis, unspecified: Secondary | ICD-10-CM | POA: Diagnosis not present

## 2021-12-04 DIAGNOSIS — R7303 Prediabetes: Secondary | ICD-10-CM

## 2021-12-04 DIAGNOSIS — G8929 Other chronic pain: Secondary | ICD-10-CM | POA: Diagnosis not present

## 2021-12-04 DIAGNOSIS — K2100 Gastro-esophageal reflux disease with esophagitis, without bleeding: Secondary | ICD-10-CM

## 2021-12-04 DIAGNOSIS — J0110 Acute frontal sinusitis, unspecified: Secondary | ICD-10-CM | POA: Diagnosis not present

## 2021-12-04 DIAGNOSIS — I10 Essential (primary) hypertension: Secondary | ICD-10-CM

## 2021-12-04 DIAGNOSIS — E559 Vitamin D deficiency, unspecified: Secondary | ICD-10-CM | POA: Diagnosis not present

## 2021-12-04 LAB — LIPID PANEL
Chol/HDL Ratio: 2.7 ratio (ref 0.0–4.4)
Cholesterol, Total: 172 mg/dL (ref 100–199)
HDL: 64 mg/dL (ref >39)
LDL Chol Calc (NIH): 87 mg/dL (ref 0–99)
Triglycerides: 119 mg/dL (ref 0–149)
VLDL Cholesterol Cal: 21 mg/dL (ref 5–40)

## 2021-12-04 LAB — CBC WITH DIFFERENTIAL/PLATELET
Basophils Absolute: 0.1 10*3/uL (ref 0.0–0.2)
Basos: 1 %
EOS (ABSOLUTE): 0.2 10*3/uL (ref 0.0–0.4)
Eos: 3 %
Hematocrit: 35.8 % (ref 34.0–46.6)
Hemoglobin: 12.2 g/dL (ref 11.1–15.9)
Immature Grans (Abs): 0 10*3/uL (ref 0.0–0.1)
Immature Granulocytes: 0 %
Lymphocytes Absolute: 2.6 10*3/uL (ref 0.7–3.1)
Lymphs: 45 %
MCH: 30.3 pg (ref 26.6–33.0)
MCHC: 34.1 g/dL (ref 31.5–35.7)
MCV: 89 fL (ref 79–97)
Monocytes Absolute: 0.4 10*3/uL (ref 0.1–0.9)
Monocytes: 7 %
Neutrophils Absolute: 2.6 10*3/uL (ref 1.4–7.0)
Neutrophils: 44 %
Platelets: 212 10*3/uL (ref 150–450)
RBC: 4.03 x10E6/uL (ref 3.77–5.28)
RDW: 12.3 % (ref 11.7–15.4)
WBC: 5.9 10*3/uL (ref 3.4–10.8)

## 2021-12-04 LAB — CMP14+EGFR
ALT: 22 IU/L (ref 0–32)
AST: 31 IU/L (ref 0–40)
Albumin/Globulin Ratio: 1.5 (ref 1.2–2.2)
Albumin: 4.3 g/dL (ref 3.7–4.7)
Alkaline Phosphatase: 80 IU/L (ref 44–121)
BUN/Creatinine Ratio: 16 (ref 12–28)
BUN: 14 mg/dL (ref 8–27)
Bilirubin Total: <0.2 mg/dL (ref 0.0–1.2)
CO2: 25 mmol/L (ref 20–29)
Calcium: 10.6 mg/dL — ABNORMAL HIGH (ref 8.7–10.3)
Chloride: 99 mmol/L (ref 96–106)
Creatinine, Ser: 0.9 mg/dL (ref 0.57–1.00)
Globulin, Total: 2.8 g/dL (ref 1.5–4.5)
Glucose: 118 mg/dL — ABNORMAL HIGH (ref 70–99)
Potassium: 3.8 mmol/L (ref 3.5–5.2)
Sodium: 140 mmol/L (ref 134–144)
Total Protein: 7.1 g/dL (ref 6.0–8.5)
eGFR: 65 mL/min/{1.73_m2} (ref >59)

## 2021-12-04 LAB — BAYER DCA HB A1C WAIVED: HB A1C (BAYER DCA - WAIVED): 6.1 % — ABNORMAL HIGH (ref 4.8–5.6)

## 2021-12-04 MED ORDER — FLUCONAZOLE 150 MG PO TABS: 150.0000 mg | ORAL_TABLET | ORAL | 0 refills | Status: DC | PRN

## 2021-12-04 MED ORDER — AMOXICILLIN-POT CLAVULANATE 875-125 MG PO TABS: 1.0000 | ORAL_TABLET | Freq: Two times a day (BID) | ORAL | 0 refills | Status: DC

## 2021-12-04 NOTE — Patient Instructions (Signed)
Health Maintenance After Age 80 After age 80, you are at a higher risk for certain long-term diseases and infections as well as injuries from falls. Falls are a major cause of broken bones and head injuries in people who are older than age 80. Getting regular preventive care can help to keep you healthy and well. Preventive care includes getting regular testing and making lifestyle changes as recommended by your health care provider. Talk with your health care provider about: Which screenings and tests you should have. A screening is a test that checks for a disease when you have no symptoms. A diet and exercise plan that is right for you. What should I know about screenings and tests to prevent falls? Screening and testing are the best ways to find a health problem early. Early diagnosis and treatment give you the best chance of managing medical conditions that are common after age 80. Certain conditions and lifestyle choices may make you more likely to have a fall. Your health care provider may recommend: Regular vision checks. Poor vision and conditions such as cataracts can make you more likely to have a fall. If you wear glasses, make sure to get your prescription updated if your vision changes. Medicine review. Work with your health care provider to regularly review all of the medicines you are taking, including over-the-counter medicines. Ask your health care provider about any side effects that may make you more likely to have a fall. Tell your health care provider if any medicines that you take make you feel dizzy or sleepy. Strength and balance checks. Your health care provider may recommend certain tests to check your strength and balance while standing, walking, or changing positions. Foot health exam. Foot pain and numbness, as well as not wearing proper footwear, can make you more likely to have a fall. Screenings, including: Osteoporosis screening. Osteoporosis is a condition that causes  the bones to get weaker and break more easily. Blood pressure screening. Blood pressure changes and medicines to control blood pressure can make you feel dizzy. Depression screening. You may be more likely to have a fall if you have a fear of falling, feel depressed, or feel unable to do activities that you used to do. Alcohol use screening. Using too much alcohol can affect your balance and may make you more likely to have a fall. Follow these instructions at home: Lifestyle Do not drink alcohol if: Your health care provider tells you not to drink. If you drink alcohol: Limit how much you have to: 0-1 drink a day for women. 0-2 drinks a day for men. Know how much alcohol is in your drink. In the [place] one drink equals one 12 oz bottle of beer (355 mL), one 5 oz glass of wine (148 mL), or one 1 oz glass of hard liquor (44 mL). Do not use any products that contain nicotine or tobacco. These products include cigarettes, chewing tobacco, and vaping devices, such as e-cigarettes. If you need help quitting, ask your health care provider. Activity  Follow a regular exercise program to stay fit. This will help you maintain your balance. Ask your health care provider what types of exercise are appropriate for you. If you need a cane or walker, use it as recommended by your health care provider. Wear supportive shoes that have nonskid soles. Safety  Remove any tripping hazards, such as rugs, cords, and clutter. Install safety equipment such as grab bars in bathrooms and safety rails on stairs. Keep rooms and walkways   well-lit. General instructions Talk with your health care provider about your risks for falling. Tell your health care provider if: You fall. Be sure to tell your health care provider about all falls, even ones that seem minor. You feel dizzy, tiredness (fatigue), or off-balance. Take over-the-counter and prescription medicines only as told by your health care provider. These include  supplements. Eat a healthy diet and maintain a healthy weight. A healthy diet includes low-fat dairy products, low-fat (lean) meats, and fiber from whole grains, beans, and lots of fruits and vegetables. Stay current with your vaccines. Schedule regular health, dental, and eye exams. Summary Having a healthy lifestyle and getting preventive care can help to protect your health and wellness after age 80. Screening and testing are the best way to find a health problem early and help you avoid having a fall. Early diagnosis and treatment give you the best chance for managing medical conditions that are more common for people who are older than age 80. Falls are a major cause of broken bones and head injuries in people who are older than age 80. Take precautions to prevent a fall at home. Work with your health care provider to learn what changes you can make to improve your health and wellness and to prevent falls. This information is not intended to replace advice given to you by your health care provider. Make sure you discuss any questions you have with your health care provider. Document Revised: 02/12/2021 Document Reviewed: 02/12/2021 Elsevier Patient Education  2022 [employer]  

## 2021-12-04 NOTE — Progress Notes (Signed)
Subjective:    Patient ID: Stacey [name], female    DOB: 08/15/1941, 81 y.o.   MRN: [id number] Pt presents to the office today for chronic follow up. She states she her daughter-in-law passed away 11/26/2021. Sinus Problem This is a new problem. The current episode started 1 to 4 weeks ago. Her pain is at a severity of 10/10. The pain is moderate. Associated symptoms include congestion, coughing, ear pain, headaches, a hoarse voice, sinus pressure and sneezing. Pertinent negatives include no shortness of breath or sore throat. Past treatments include oral decongestants. The treatment provided mild relief.  Hypertension This is a chronic problem. The current episode started more than 1 year ago. The problem has been resolved since onset. The problem is controlled. Associated symptoms include anxiety, headaches and peripheral edema. Pertinent negatives include no blurred vision, malaise/fatigue or shortness of breath. Risk factors for coronary artery disease include dyslipidemia, diabetes mellitus and sedentary lifestyle. The current treatment provides moderate improvement. There is no history of heart failure.  Gastroesophageal Reflux She complains of belching, coughing, heartburn and a hoarse voice. She reports no sore throat. This is a chronic problem. The current episode started more than 1 year ago. The problem occurs occasionally. She has tried a PPI for the symptoms. The treatment provided moderate relief.  Arthritis Presents for follow-up visit. She complains of pain and stiffness. The symptoms have been stable. Affected locations include the left knee, right knee, left MCP and right MCP. Her pain is at a severity of 10/10.  Hyperlipidemia This is a chronic problem. The current episode started more than 1 year ago. The problem is controlled. Exacerbating diseases include obesity. Pertinent negatives include no shortness of breath. Current antihyperlipidemic treatment includes statins. The  current treatment provides moderate improvement of lipids. Risk factors for coronary artery disease include dyslipidemia, hypertension, female sex and post-menopausal.  Anxiety Presents for follow-up visit. Symptoms include depressed mood, excessive worry, irritability, nervous/anxious behavior and restlessness. Patient reports no shortness of breath. Symptoms occur occasionally. The severity of symptoms is moderate.    Back Pain This is a chronic problem. The current episode started more than 1 year ago. The problem occurs intermittently. The problem has been waxing and waning since onset. The pain is present in the lumbar spine. The pain is at a severity of 5/10. The pain is moderate. Associated symptoms include headaches. She has tried bed rest for the symptoms. The treatment provided mild relief.  Diabetes She presents for her follow-up diabetic visit. She has type 2 diabetes mellitus. Hypoglycemia symptoms include headaches and nervousness/anxiousness. Pertinent negatives for diabetes include no blurred vision and no foot paresthesias. Risk factors for coronary artery disease include dyslipidemia, diabetes mellitus and hypertension. ( Does not check BC at home)     Review of Systems  Constitutional:  Positive for irritability. Negative for malaise/fatigue.  HENT:  Positive for congestion, ear pain, hoarse voice, sinus pressure and sneezing. Negative for sore throat.   Eyes:  Negative for blurred vision.  Respiratory:  Positive for cough. Negative for shortness of breath.   Gastrointestinal:  Positive for heartburn.  Musculoskeletal:  Positive for arthritis, back pain and stiffness.  Neurological:  Positive for headaches.  Psychiatric/Behavioral:  The patient is nervous/anxious.   All other systems reviewed and are negative.     Objective:   Physical Exam Vitals reviewed.  Constitutional:      General: She is not in acute distress.    Appearance: She is well-developed. She is obese.  HENT:     Head: Normocephalic and atraumatic.     Right Ear: Tympanic membrane normal.     Left Ear: Tympanic membrane normal.     Nose:     Right Sinus: Frontal sinus tenderness present.     Left Sinus: Frontal sinus tenderness present.  Eyes:     Pupils: Pupils are equal, round, and reactive to light.  Neck:     Thyroid: No thyromegaly.  Cardiovascular:     Rate and Rhythm: Normal rate and regular rhythm.     Heart sounds: Normal heart sounds. No murmur heard. Pulmonary:     Effort: Pulmonary effort is normal. No respiratory distress.     Breath sounds: Normal breath sounds. No wheezing.  Abdominal:     General: Bowel sounds are normal. There is no distension.     Palpations: Abdomen is soft.     Tenderness: There is no abdominal tenderness.  Musculoskeletal:        General: No tenderness. Normal range of motion.     Cervical back: Normal range of motion and neck supple.  Skin:    General: Skin is warm and dry.  Neurological:     Mental Status: She is alert and oriented to person, place, and time.     Cranial Nerves: No cranial nerve deficit.     Deep Tendon Reflexes: Reflexes are normal and symmetric.  Psychiatric:        Behavior: Behavior normal.        Thought Content: Thought content normal.        Judgment: Judgment normal.      BP 131/78    Pulse 87    Temp 97.8 F (36.6 C) (Temporal)    Ht '5\' 3"'  (1.6 m)    Wt 180 lb 3.2 oz (81.7 kg)    BMI 31.92 kg/m      Assessment & Plan:  Stacey [name] comes in today with chief complaint of Medical Management of Chronic Issues and Sinus Problem (Since the first of the mth )   Diagnosis and orders addressed:  1. Primary hypertension - CMP14+EGFR - CBC with Differential/Platelet  2. Gastroesophageal reflux disease with esophagitis, unspecified whether hemorrhage - CMP14+EGFR - CBC with Differential/Platelet  3. Primary osteoarthritis involving multiple joints - CMP14+EGFR - CBC with  Differential/Platelet  4. Prediabetes - Bayer DCA Hb A1c Waived - CMP14+EGFR - CBC with Differential/Platelet  5. Vitamin D deficiency  - CMP14+EGFR - CBC with Differential/Platelet  6. Morbid obesity ([place])  - CMP14+EGFR - CBC with Differential/Platelet  7. Hyperlipidemia, unspecified hyperlipidemia type  - CMP14+EGFR - CBC with Differential/Platelet - Lipid panel  8. GAD (generalized anxiety disorder) - CMP14+EGFR - CBC with Differential/Platelet  9. Chronic bilateral low back pain with right-sided sciatica - CMP14+EGFR - CBC with Differential/Platelet  10. Acute frontal sinusitis, recurrence not specified - Take meds as prescribed - Use a cool mist humidifier  -Use saline nose sprays frequently -Force fluids -For any cough or congestion  Use plain Mucinex- regular strength or max strength is fine -For fever or aces or pains- take tylenol or ibuprofen. -Throat lozenges if help - amoxicillin-clavulanate (AUGMENTIN) 875-125 MG tablet; Take 1 tablet by mouth 2 (two) times daily.  Dispense: 14 tablet; Refill: 0   Labs pending Health Maintenance reviewed Diet and exercise encouraged  Follow up plan: 4 months    Evelina Dun, FNP

## 2022-01-21 ENCOUNTER — Telehealth: Payer: Self-pay

## 2022-01-21 NOTE — Chronic Care Management (AMB) (Signed)
?  Care Management  ? ?Outreach Note ? ?01/21/2022 ?Name: Stacey [name] MRN: [id number] DOB: 06/13/1941 ? ?Referred by: [name], FNP ?Reason for referral : Care Coordination (Outreach to schedule initial with RNCM from HR list ) ? ? ?An unsuccessful telephone outreach was attempted today. The patient was referred to the case management team for assistance with care management and care coordination.  ? ?Follow Up Plan:  ?A HIPAA compliant phone message was left for the patient providing contact information and requesting a return call.  ?The care management team will reach out to the patient again over the next 7 days.  ?If patient returns call to provider office, please advise to call Embedded Care Management Care Guide Penne Lash * at [phone]* ? ?Penne Lash, RMA ?Care Guide, Embedded Care Coordination ?[facility]  ?[place], [place] [address] ?Direct Dial: [phone] ?[email or web address] ?[email or web address]: [email or web address]  ? ?

## 2022-01-23 ENCOUNTER — Encounter: Payer: Self-pay | Admitting: Family Medicine

## 2022-01-23 ENCOUNTER — Ambulatory Visit (INDEPENDENT_AMBULATORY_CARE_PROVIDER_SITE_OTHER): Payer: Medicare Other | Admitting: Family Medicine

## 2022-01-23 VITALS — BP 132/73 | HR 97 | Temp 97.9°F | Ht 63.0 in | Wt 176.0 lb

## 2022-01-23 DIAGNOSIS — M10071 Idiopathic gout, right ankle and foot: Secondary | ICD-10-CM

## 2022-01-23 MED ORDER — METHYLPREDNISOLONE ACETATE 40 MG/ML IJ SUSP
40.0000 mg | Freq: Once | INTRAMUSCULAR | Status: AC
  Administered 2022-01-23: 40 mg via INTRAMUSCULAR

## 2022-01-23 MED ORDER — METHYLPREDNISOLONE ACETATE 40 MG/ML IJ SUSP: 40.0000 mg | Freq: Once | INTRAMUSCULAR | Status: DC

## 2022-01-23 MED ORDER — PREDNISONE 20 MG PO TABS: ORAL_TABLET | ORAL | 0 refills | Status: DC

## 2022-01-23 NOTE — Progress Notes (Signed)
?  ? ?Subjective:  ?Patient ID: Stacey [name], female    DOB: 11/01/1940, 81 y.o.   MRN: [id number] ? ?Patient Care Team: ?Sharion Balloon, FNP as PCP - General (Nurse Practitioner)  ? ?Chief Complaint:  No chief complaint on file. ? ? ?HPI: ?Stacey [name] is a 81 y.o. female presenting on 01/23/2022 for No chief complaint on file. ? ? ?Pt presents today with complaints of right great toe swelling, pain, and erythema. Onset Monday. No changes in diet or known triggers. No injury. Has had gout in the past, no recent flares.  ? ? ? ?Relevant past medical, surgical, family, and social history reviewed and updated as indicated.  ?Allergies and medications reviewed and updated. Data reviewed: Chart in Epic. ? ? ?Past Medical History:  ?Diagnosis Date  ? Arthritis   ? "knees" (05/27/2013)  ? Bowel obstruction ([place])   ? Chronic lower back pain   ? "bulging discs" (05/27/2013)  ? GERD (gastroesophageal reflux disease)   ? Headache(784.0)   ? "maybe once/wk' (05/27/2013)  ? Hyperlipidemia   ? Hypertension   ? ? ?Past Surgical History:  ?Procedure Laterality Date  ? ABDOMINAL HYSTERECTOMY  1980's  ? APPENDECTOMY  1980's  ? BILATERAL OOPHORECTOMY Bilateral 2000  ? COLON SURGERY  10/09/2003  ? small bowel resection/notes 10/09/2003 (05/27/2013)  ? INGUINAL HERNIA REPAIR  10/09/2003; 2006  ? Laparotomy with extensive lysis of adhesions,/notes 10/09/2003 (05/27/2013);   ? ? ?Social History  ? ?Socioeconomic History  ? Marital status: Widowed  ?  Spouse name: Not on file  ? Number of children: 2  ? Years of education: Not on file  ? Highest education level: Not on file  ?Occupational History  ? Occupation: retired  ?Tobacco Use  ? Smoking status: Former  ?  Packs/day: 0.25  ?  Years: 25.00  ?  Pack years: 6.25  ?  Types: Cigarettes  ? Smokeless tobacco: Never  ?Vaping Use  ? Vaping Use: Never used  ?Substance and Sexual Activity  ? Alcohol use: No  ? Drug use: No  ? Sexual activity: Never  ?Other Topics Concern  ? Not on file  ?Social  History Narrative  ? She lives alone in [place] - ground level apartment. Her 2 sons live in [place]  ? ?Social Determinants of Health  ? ?Financial Resource Strain: Low Risk   ? Difficulty of Paying Living Expenses: Not hard at all  ?Food Insecurity: No Food Insecurity  ? Worried About Charity fundraiser in the Last Year: Never true  ? Ran Out of Food in the Last Year: Never true  ?Transportation Needs: No Transportation Needs  ? Lack of Transportation (Medical): No  ? Lack of Transportation (Non-Medical): No  ?Physical Activity: Inactive  ? Days of Exercise per Week: 0 days  ? Minutes of Exercise per Session: 0 min  ?Stress: No Stress Concern Present  ? Feeling of Stress : Only a little  ?Social Connections: Socially Isolated  ? Frequency of Communication with Friends and Family: More than three times a week  ? Frequency of Social Gatherings with Friends and Family: Twice a week  ? Attends Religious Services: Never  ? Active Member of Clubs or Organizations: No  ? Attends Archivist Meetings: Never  ? Marital Status: Widowed  ?Intimate Partner Violence: Not At Risk  ? Fear of Current or Ex-Partner: No  ? Emotionally Abused: No  ? Physically Abused: No  ? Sexually Abused: No  ? ? ?  Outpatient Encounter Medications as of 01/23/2022  ?Medication Sig  ? predniSONE (DELTASONE) 20 MG tablet 2 po at sametime daily for 5 days- start tomorrow  ? acetaminophen (TYLENOL) 325 MG tablet Take 650 mg by mouth every 6 (six) hours as needed for moderate pain or fever.  ? amoxicillin-clavulanate (AUGMENTIN) 875-125 MG tablet Take 1 tablet by mouth 2 (two) times daily.  ? atorvastatin (LIPITOR) 20 MG tablet Take 1 tablet (20 mg total) by mouth daily.  ? fish oil-omega-3 fatty acids 1000 MG capsule Take 1 g by mouth 2 (two) times daily.  ? fluconazole (DIFLUCAN) 150 MG tablet Take 1 tablet (150 mg total) by mouth every three (3) days as needed.  ? hydrochlorothiazide (HYDRODIURIL) 25 MG tablet Take 1 tablet (25 mg total) by  mouth daily.  ? loratadine (CLARITIN) 10 MG tablet Take 10 mg by mouth daily.  ? Misc. Devices (CLASSICS ROLLING WALKER) MISC 1 rolling walker for home use  ? Multiple Vitamin (MULITIVITAMIN WITH MINERALS) TABS Take 1 tablet by mouth daily.  ? ondansetron (ZOFRAN ODT) 4 MG disintegrating tablet Take 1 tablet (4 mg total) by mouth every 8 (eight) hours as needed for nausea.  ? oxymetazoline (AFRIN) 0.05 % nasal spray Place 2 sprays into both nostrils 2 (two) times daily as needed for congestion.  ? ?No facility-administered encounter medications on file as of 01/23/2022.  ? ? ?Allergies  ?Allergen Reactions  ? Clindamycin/Lincomycin Diarrhea  ?   ?  ? Colchicine   ?  Makes her to weak  ? Other   ?  OMNICEF- DIARRHEA  ? Phenergan [Promethazine Hcl] Swelling  ?  Mouth swelling  ? Statins Other (See Comments)  ?  Weakness all over  ? ? ?Review of Systems  ?Constitutional:  Negative for activity change, appetite change, chills, fatigue and fever.  ?HENT: Negative.    ?Eyes: Negative.   ?Respiratory:  Negative for cough, chest tightness and shortness of breath.   ?Cardiovascular:  Negative for chest pain, palpitations and leg swelling.  ?Gastrointestinal:  Negative for abdominal pain, blood in stool, constipation, diarrhea, nausea and vomiting.  ?Endocrine: Negative.   ?Genitourinary:  Negative for dysuria, frequency and urgency.  ?Musculoskeletal:  Positive for arthralgias, gait problem and joint swelling. Negative for myalgias.  ?Skin: Negative.   ?Allergic/Immunologic: Negative.   ?Neurological:  Negative for dizziness and headaches.  ?Hematological: Negative.   ?Psychiatric/Behavioral:  Negative for confusion, hallucinations, sleep disturbance and suicidal ideas.   ?All other systems reviewed and are negative. ? ?   ? ?Objective:  ?BP 132/73   Pulse 97   Temp 97.9 ?F (36.6 ?C)   Ht '5\' 3"'  (1.6 m)   Wt 176 lb (79.8 kg)   SpO2 95%   BMI 31.18 kg/m?   ? ?Wt Readings from Last 3 Encounters:  ?01/23/22 176 lb (79.8 kg)   ?12/04/21 180 lb 3.2 oz (81.7 kg)  ?09/04/21 181 lb 12.8 oz (82.5 kg)  ? ? ?Physical Exam ?Vitals and nursing note reviewed.  ?Constitutional:   ?   General: She is not in acute distress. ?   Appearance: Normal appearance. She is not ill-appearing, toxic-appearing or diaphoretic.  ?HENT:  ?   Head: Normocephalic and atraumatic.  ?Eyes:  ?   Conjunctiva/sclera: Conjunctivae normal.  ?   Pupils: Pupils are equal, round, and reactive to light.  ?Cardiovascular:  ?   Rate and Rhythm: Normal rate and regular rhythm.  ?   Pulses: Normal pulses.     ?  Dorsalis pedis pulses are 2+ on the right side and 2+ on the left side.  ?     Posterior tibial pulses are 2+ on the right side and 2+ on the left side.  ?   Heart sounds: Normal heart sounds.  ?Pulmonary:  ?   Effort: Pulmonary effort is normal.  ?   Breath sounds: Normal breath sounds.  ?Musculoskeletal:  ?   Right foot: Swelling and tenderness present.  ?   Left foot: Normal.  ?     Feet: ? ?Skin: ?   General: Skin is warm and dry.  ?   Capillary Refill: Capillary refill takes less than 2 seconds.  ?Neurological:  ?   General: No focal deficit present.  ?   Mental Status: She is alert and oriented to person, place, and time.  ?Psychiatric:     ?   Mood and Affect: Mood normal.     ?   Behavior: Behavior normal.     ?   Thought Content: Thought content normal.     ?   Judgment: Judgment normal.  ? ? ?Results for orders placed or performed in visit on 12/04/21  ?Bayer DCA Hb A1c Waived  ?Result Value Ref Range  ? HB A1C (BAYER DCA - WAIVED) 6.1 (H) 4.8 - 5.6 %  ?CMP14+EGFR  ?Result Value Ref Range  ? Glucose 118 (H) 70 - 99 mg/dL  ? BUN 14 8 - 27 mg/dL  ? Creatinine, Ser 0.90 0.57 - 1.00 mg/dL  ? eGFR 65 >59 mL/min/1.73  ? BUN/Creatinine Ratio 16 12 - 28  ? Sodium 140 134 - 144 mmol/L  ? Potassium 3.8 3.5 - 5.2 mmol/L  ? Chloride 99 96 - 106 mmol/L  ? CO2 25 20 - 29 mmol/L  ? Calcium 10.6 (H) 8.7 - 10.3 mg/dL  ? Total Protein 7.1 6.0 - 8.5 g/dL  ? Albumin 4.3 3.7 - 4.7  g/dL  ? Globulin, Total 2.8 1.5 - 4.5 g/dL  ? Albumin/Globulin Ratio 1.5 1.2 - 2.2  ? Bilirubin Total <0.2 0.0 - 1.2 mg/dL  ? Alkaline Phosphatase 80 44 - 121 IU/L  ? AST 31 0 - 40 IU/L  ? ALT 22 0 - 32 IU/L

## 2022-01-23 NOTE — Addendum Note (Signed)
Addended by: Angela Nevin D on: 01/23/2022 05:36 PM ? ? Modules accepted: Orders ? ?

## 2022-01-31 NOTE — Chronic Care Management (AMB) (Signed)
?  Care Management  ? ?Outreach Note ? ?01/31/2022 ?Name: Stacey [name] MRN: [id number] DOB: September 15, 1941 ? ?Referred by: [name], FNP ?Reason for referral : Care Coordination (Outreach to schedule initial with RNCM from HR list ) ? ? ?A second unsuccessful telephone outreach was attempted today. The patient was referred to the case management team for assistance with care management and care coordination.  ? ?Follow Up Plan:  ?The care management team will reach out to the patient again over the next 7 days.  ?If patient returns call to provider office, please advise to call Embedded Care Management Care Guide Penne Lash * at [phone]* ? ?Penne Lash, RMA ?Care Guide, Embedded Care Coordination ?[facility]  ?[place], [place] [address] ?Direct Dial: [phone] ?[email or web address] ?Website: [email or web address]  ? ?

## 2022-02-19 NOTE — Chronic Care Management (AMB) (Signed)
?  Care Management  ? ?Outreach Note ? ?02/19/2022 ?Name: Stacey [name] MRN: [id number] DOB: 05-Aug-1941 ? ?Referred by: [name], FNP ?Reason for referral : Care Coordination (Outreach to schedule initial with RNCM from HR list ) ? ? ?Third unsuccessful telephone outreach was attempted today. The patient was referred to the case management team for assistance with care management and care coordination. The patient's primary care provider has been notified of our unsuccessful attempts to make or maintain contact with the patient. The care management team is pleased to engage with this patient at any time in the future should he/she be interested in assistance from the care management team.  ? ?Follow Up Plan:  ?We have been unable to make contact with the patient for follow up. The care management team is available to follow up with the patient after provider conversation with the patient regarding recommendation for care management engagement and subsequent re-referral to the care management team.  ? ?Penne Lash, RMA ?Care Guide, Embedded Care Coordination ?[facility]  ?[place], [place] [address] ?Direct Dial: [phone] ?[email or web address] ?Website: [email or web address]  ? ?

## 2022-03-05 ENCOUNTER — Ambulatory Visit (INDEPENDENT_AMBULATORY_CARE_PROVIDER_SITE_OTHER): Payer: Medicare Other | Admitting: Family

## 2022-03-05 ENCOUNTER — Encounter: Payer: Self-pay | Admitting: Family

## 2022-03-05 VITALS — BP 136/81 | HR 87 | Temp 97.2°F | Ht 63.0 in | Wt 175.1 lb

## 2022-03-05 DIAGNOSIS — R7303 Prediabetes: Secondary | ICD-10-CM | POA: Diagnosis not present

## 2022-03-05 DIAGNOSIS — E559 Vitamin D deficiency, unspecified: Secondary | ICD-10-CM | POA: Diagnosis not present

## 2022-03-05 DIAGNOSIS — K2100 Gastro-esophageal reflux disease with esophagitis, without bleeding: Secondary | ICD-10-CM | POA: Diagnosis not present

## 2022-03-05 DIAGNOSIS — I10 Essential (primary) hypertension: Secondary | ICD-10-CM

## 2022-03-05 DIAGNOSIS — E669 Obesity, unspecified: Secondary | ICD-10-CM | POA: Diagnosis not present

## 2022-03-05 DIAGNOSIS — M159 Polyosteoarthritis, unspecified: Secondary | ICD-10-CM

## 2022-03-05 DIAGNOSIS — E785 Hyperlipidemia, unspecified: Secondary | ICD-10-CM | POA: Diagnosis not present

## 2022-03-05 LAB — BAYER DCA HB A1C WAIVED: HB A1C (BAYER DCA - WAIVED): 6.1 % — ABNORMAL HIGH (ref 4.8–5.6)

## 2022-03-05 MED ORDER — DICLOFENAC SODIUM 75 MG PO TBEC
75.0000 mg | DELAYED_RELEASE_TABLET | Freq: Two times a day (BID) | ORAL | 2 refills | Status: DC
Start: 2022-03-05 — End: 2023-02-28

## 2022-03-05 MED ORDER — ATORVASTATIN CALCIUM 20 MG PO TABS: 20.0000 mg | ORAL_TABLET | Freq: Every day | ORAL | 3 refills | Status: DC

## 2022-03-05 MED ORDER — HYDROCHLOROTHIAZIDE 25 MG PO TABS: 25.0000 mg | ORAL_TABLET | Freq: Every day | ORAL | 2 refills | Status: DC

## 2022-03-05 NOTE — Patient Instructions (Signed)
Osteoarthritis  Osteoarthritis is a type of arthritis. It refers to joint pain or joint disease. Osteoarthritis affects tissue that covers the ends of bones in joints (cartilage). Cartilage acts as a cushion between the bones and helps them move smoothly. Osteoarthritis occurs when cartilage in the joints gets worn down. Osteoarthritis is sometimes called "wear and tear" arthritis. Osteoarthritis is the most common form of arthritis. It often occurs in older people. It is a condition that gets worse over time. The joints most often affected by this condition are in the fingers, toes, hips, knees, and spine, including the neck and lower back. What are the causes? This condition is caused by the wearing down of cartilage that covers the ends of bones. What increases the risk? The following factors may make you more likely to develop this condition: Being age 50 or older. Obesity. Overuse of joints. Past injury of a joint. Past surgery on a joint. Family history of osteoarthritis. What are the signs or symptoms? The main symptoms of this condition are pain, swelling, and stiffness in the joint. Other symptoms may include: An enlarged joint. More pain and further damage caused by small pieces of bone or cartilage that break off and float inside of the joint. Small deposits of bone (osteophytes) that grow on the edges of the joint. A grating or scraping feeling inside the joint when you move it. Popping or creaking sounds when you move. Difficulty walking or exercising. An inability to grip items, twist your hand(s), or control the movements of your hands and fingers. How is this diagnosed? This condition may be diagnosed based on: Your medical history. A physical exam. Your symptoms. X-rays of the affected joint(s). Blood tests to rule out other types of arthritis. How is this treated? There is no cure for this condition, but treatment can help control pain and improve joint function.  Treatment may include a combination of therapies, such as: Pain relief techniques, such as: Applying heat and cold to the joint. Massage. A form of talk therapy called cognitive behavioral therapy (CBT). This therapy helps you set goals and follow up on the changes that you make. Medicines for pain and inflammation. The medicines can be taken by mouth or applied to the skin. They include: NSAIDs, such as ibuprofen. Prescription medicines. Strong anti-inflammatory medicines (corticosteroids). Certain nutritional supplements. A prescribed exercise program. You may work with a physical therapist. Assistive devices, such as a brace, wrap, splint, specialized glove, or cane. A weight control plan. Surgery, such as: An osteotomy. This is done to reposition the bones and relieve pain or to remove loose pieces of bone and cartilage. Joint replacement surgery. You may need this surgery if you have advanced osteoarthritis. Follow these instructions at home: Activity Rest your affected joints as told by your health care provider. Exercise as told by your health care provider. He or she may recommend specific types of exercise, such as: Strengthening exercises. These are done to strengthen the muscles that support joints affected by arthritis. Aerobic activities. These are exercises, such as brisk walking or water aerobics, that increase your heart rate. Range-of-motion activities. These help your joints move more easily. Balance and agility exercises. Managing pain, stiffness, and swelling     If directed, apply heat to the affected area as often as told by your health care provider. Use the heat source that your health care provider recommends, such as a moist heat pack or a heating pad. If you have a removable assistive device, remove it   as told by your health care provider. Place a towel between your skin and the heat source. If your health care provider tells you to keep the assistive device  on while you apply heat, place a towel between the assistive device and the heat source. Leave the heat on for 20-30 minutes. Remove the heat if your skin turns bright red. This is especially important if you are unable to feel pain, heat, or cold. You may have a greater risk of getting burned. If directed, put ice on the affected area. To do this: If you have a removable assistive device, remove it as told by your health care provider. Put ice in a plastic bag. Place a towel between your skin and the bag. If your health care provider tells you to keep the assistive device on during icing, place a towel between the assistive device and the bag. Leave the ice on for 20 minutes, 2-3 times a day. Move your fingers or toes often to reduce stiffness and swelling. Raise (elevate) the injured area above the level of your heart while you are sitting or lying down. General instructions Take over-the-counter and prescription medicines only as told by your health care provider. Maintain a healthy weight. Follow instructions from your health care provider for weight control. Do not use any products that contain nicotine or tobacco, such as cigarettes, e-cigarettes, and chewing tobacco. If you need help quitting, ask your health care provider. Use assistive devices as told by your health care provider. Keep all follow-up visits as told by your health care provider. This is important. Where to find more information National Institute of Arthritis and Musculoskeletal and Skin Diseases: www.niams.nih.gov National Institute on Aging: www.nia.nih.gov American College of Rheumatology: www.rheumatology.org Contact a health care provider if: You have redness, swelling, or a feeling of warmth in a joint that gets worse. You have a fever along with joint or muscle aches. You develop a rash. You have trouble doing your normal activities. Get help right away if: You have pain that gets worse and is not relieved by  pain medicine. Summary Osteoarthritis is a type of arthritis that affects tissue covering the ends of bones in joints (cartilage). This condition is caused by the wearing down of cartilage that covers the ends of bones. The main symptom of this condition is pain, swelling, and stiffness in the joint. There is no cure for this condition, but treatment can help control pain and improve joint function. This information is not intended to replace advice given to you by your health care provider. Make sure you discuss any questions you have with your health care provider. Document Revised: 09/20/2019 Document Reviewed: 09/20/2019 Elsevier Patient Education  2023 Elsevier Inc.  

## 2022-03-05 NOTE — Progress Notes (Signed)
Subjective:    Patient ID: Stacey [name], female    DOB: 16-Apr-1941, 81 y.o.   MRN: [id number]  Chief Complaint  Patient presents with   Medical Management of Chronic Issues   Hypertension   Prediabetes   Pt presents to the office today for chronic follow up. She states she her daughter-in-law passed away 11-23-2021. Hypertension This is a chronic problem. The current episode started more than 1 year ago. The problem has been resolved since onset. The problem is controlled. Associated symptoms include anxiety, malaise/fatigue and peripheral edema. Pertinent negatives include no blurred vision or shortness of breath. Risk factors for coronary artery disease include dyslipidemia, obesity and sedentary lifestyle. The current treatment provides moderate improvement.  Gastroesophageal Reflux She complains of belching and heartburn. This is a chronic problem. The current episode started more than 1 year ago. The problem has been resolved. She has tried a PPI for the symptoms. The treatment provided moderate relief.  Arthritis Presents for follow-up visit. She complains of pain and stiffness. The symptoms have been stable. Affected locations include the left knee, right knee, left MCP and right MCP. Her pain is at a severity of 10/10 (some days).  Hyperlipidemia This is a chronic problem. The current episode started more than 1 year ago. The problem is controlled. Pertinent negatives include no shortness of breath. Current antihyperlipidemic treatment includes statins. The current treatment provides moderate improvement of lipids. Risk factors for coronary artery disease include dyslipidemia, hypertension and a sedentary lifestyle.  Anxiety Presents for follow-up visit. Symptoms include excessive worry, irritability and nervous/anxious behavior. Patient reports no shortness of breath.    Diabetes She presents for her follow-up diabetic visit. She has type 2 diabetes mellitus. Hypoglycemia symptoms  include nervousness/anxiousness. Pertinent negatives for diabetes include no blurred vision and no foot paresthesias. Risk factors for coronary artery disease include dyslipidemia, diabetes mellitus, hypertension, sedentary lifestyle and post-menopausal. (Does not check BS at home)     Review of Systems  Constitutional:  Positive for irritability and malaise/fatigue.  Eyes:  Negative for blurred vision.  Respiratory:  Negative for shortness of breath.   Gastrointestinal:  Positive for heartburn.  Musculoskeletal:  Positive for arthritis and stiffness.  Psychiatric/Behavioral:  The patient is nervous/anxious.   All other systems reviewed and are negative.     Objective:   Physical Exam Vitals reviewed.  Constitutional:      General: She is not in acute distress.    Appearance: She is well-developed. She is obese.  HENT:     Head: Normocephalic and atraumatic.     Right Ear: Tympanic membrane normal.     Left Ear: Tympanic membrane normal.  Eyes:     Pupils: Pupils are equal, round, and reactive to light.  Neck:     Thyroid: No thyromegaly.  Cardiovascular:     Rate and Rhythm: Normal rate and regular rhythm.     Heart sounds: Normal heart sounds. No murmur heard. Pulmonary:     Effort: Pulmonary effort is normal. No respiratory distress.     Breath sounds: Normal breath sounds. No wheezing.  Abdominal:     General: Bowel sounds are normal. There is no distension.     Palpations: Abdomen is soft.     Tenderness: There is no abdominal tenderness.  Musculoskeletal:        General: No tenderness. Normal range of motion.     Cervical back: Normal range of motion and neck supple. 2+ swelling bilaterally in LE Skin:  General: Skin is warm and dry.  Neurological:     Mental Status: She is alert and oriented to person, place, and time.     Cranial Nerves: No cranial nerve deficit.     Deep Tendon Reflexes: Reflexes are normal and symmetric.  Psychiatric:        Behavior:  Behavior normal.        Thought Content: Thought content normal.        Judgment: Judgment normal.      BP 136/81   Pulse 87   Temp (!) 97.2 F (36.2 C) (Temporal)   Ht '5\' 3"'  (1.6 m)   Wt 175 lb 2 oz (79.4 kg)   SpO2 96%   BMI 31.02 kg/m      Assessment & Plan:  Stacey [name] comes in today with chief complaint of Medical Management of Chronic Issues, Hypertension, and Prediabetes   Diagnosis and orders addressed:  1. Prediabetes - Bayer DCA Hb A1c Waived - CMP14+EGFR  2. Primary hypertension - CMP14+EGFR - hydrochlorothiazide (HYDRODIURIL) 25 MG tablet; Take 1 tablet (25 mg total) by mouth daily.  Dispense: 90 tablet; Refill: 2  3. Gastroesophageal reflux disease with esophagitis, unspecified whether hemorrhage  4. Primary osteoarthritis involving multiple joints Start diclofenac BID  No other NSAID's  - diclofenac (VOLTAREN) 75 MG EC tablet; Take 1 tablet (75 mg total) by mouth 2 (two) times daily.  Dispense: 180 tablet; Refill: 2  5. Hyperlipidemia, unspecified hyperlipidemia type - atorvastatin (LIPITOR) 20 MG tablet; Take 1 tablet (20 mg total) by mouth daily.  Dispense: 90 tablet; Refill: 3  6. Obesity (BMI 30-39.9)  7. Vitamin D deficiency   Labs pending Health Maintenance reviewed Diet and exercise encouraged  Follow up plan: 4 months    Evelina Dun, FNP

## 2022-03-06 LAB — CMP14+EGFR
ALT: 14 IU/L (ref 0–32)
AST: 19 IU/L (ref 0–40)
Albumin/Globulin Ratio: 1.7 (ref 1.2–2.2)
Albumin: 4.4 g/dL (ref 3.7–4.7)
Alkaline Phosphatase: 69 IU/L (ref 44–121)
BUN/Creatinine Ratio: 13 (ref 12–28)
BUN: 12 mg/dL (ref 8–27)
Bilirubin Total: 0.3 mg/dL (ref 0.0–1.2)
CO2: 25 mmol/L (ref 20–29)
Calcium: 10.2 mg/dL (ref 8.7–10.3)
Chloride: 100 mmol/L (ref 96–106)
Creatinine, Ser: 0.93 mg/dL (ref 0.57–1.00)
Globulin, Total: 2.6 g/dL (ref 1.5–4.5)
Glucose: 113 mg/dL — ABNORMAL HIGH (ref 70–99)
Potassium: 3.8 mmol/L (ref 3.5–5.2)
Sodium: 142 mmol/L (ref 134–144)
Total Protein: 7 g/dL (ref 6.0–8.5)
eGFR: 63 mL/min/{1.73_m2} (ref >59)

## 2022-05-13 ENCOUNTER — Ambulatory Visit: Payer: Medicare Other

## 2022-05-20 ENCOUNTER — Ambulatory Visit: Payer: Medicare Other

## 2022-05-23 ENCOUNTER — Ambulatory Visit: Payer: Medicare Other

## 2022-06-03 ENCOUNTER — Encounter: Payer: Self-pay | Admitting: Nurse Practitioner

## 2022-06-03 ENCOUNTER — Ambulatory Visit (INDEPENDENT_AMBULATORY_CARE_PROVIDER_SITE_OTHER): Payer: Medicare Other | Admitting: Nurse Practitioner

## 2022-06-03 VITALS — BP 136/79 | HR 82 | Temp 97.2°F | Resp 20 | Ht 63.0 in | Wt 175.0 lb

## 2022-06-03 DIAGNOSIS — H61032 Chondritis of left external ear: Secondary | ICD-10-CM

## 2022-06-03 MED ORDER — MELOXICAM 15 MG PO TABS: 15.0000 mg | ORAL_TABLET | Freq: Every day | ORAL | 0 refills | Status: DC

## 2022-06-03 NOTE — Progress Notes (Signed)
Subjective:    Patient ID: Stacey [name], female    DOB: 05/07/1941, 81 y.o.   MRN: [id number]   Chief Complaint: Hypertension   Hypertension Pertinent negatives include no chest pain, headaches, palpitations or shortness of breath.   Patient comes in worried about her blood pressure. She checked hr blood pressure this morning and it was 135/104. She said her left ear felt like it was burning and was red. She says this has been happening 1-2 x a week.   BP Readings from Last 3 Encounters:  06/03/22 136/79  03/05/22 136/81  01/23/22 132/73       Review of Systems  Constitutional:  Negative for diaphoresis.  Eyes:  Negative for pain.  Respiratory:  Negative for shortness of breath.   Cardiovascular:  Negative for chest pain, palpitations and leg swelling.  Gastrointestinal:  Negative for abdominal pain.  Endocrine: Negative for polydipsia.  Skin:  Negative for rash.  Neurological:  Negative for dizziness, weakness and headaches.  Hematological:  Does not bruise/bleed easily.  All other systems reviewed and are negative.      Objective:   Physical Exam Vitals and nursing note reviewed.  Constitutional:      General: She is not in acute distress.    Appearance: Normal appearance. She is well-developed.  HENT:     Head: Normocephalic.     Right Ear: Tympanic membrane normal.     Left Ear: Tympanic membrane normal.     Ears:     Comments: left auricle erythematous    Nose: Nose normal.     Mouth/Throat:     Mouth: Mucous membranes are moist.  Eyes:     Pupils: Pupils are equal, round, and reactive to light.  Neck:     Vascular: No carotid bruit or JVD.  Cardiovascular:     Rate and Rhythm: Normal rate and regular rhythm.     Heart sounds: Normal heart sounds.  Pulmonary:     Effort: Pulmonary effort is normal. No respiratory distress.     Breath sounds: Normal breath sounds. No wheezing or rales.  Chest:     Chest wall: No tenderness.  Abdominal:      General: Bowel sounds are normal. There is no distension or abdominal bruit.     Palpations: Abdomen is soft. There is no hepatomegaly, splenomegaly, mass or pulsatile mass.     Tenderness: There is no abdominal tenderness.  Musculoskeletal:        General: Normal range of motion.     Cervical back: Normal range of motion and neck supple.  Lymphadenopathy:     Cervical: No cervical adenopathy.  Skin:    General: Skin is warm and dry.  Neurological:     Mental Status: She is alert and oriented to person, place, and time.     Deep Tendon Reflexes: Reflexes are normal and symmetric.  Psychiatric:        Behavior: Behavior normal.        Thought Content: Thought content normal.        Judgment: Judgment normal.    BP 136/79   Pulse 82   Temp (!) 97.2 F (36.2 C) (Oral)   Resp 20   Ht 5\' 3"  (1.6 m)   Wt 175 lb (79.4 kg)   SpO2 99%   BMI 31.00 kg/m         Assessment & Plan:  in today with chief complaint of Hypertension   1. Chondritis of  left external ear Ice Avoid rubbing RTO prn  Keep diary of blood pressure at home    The above assessment and management plan was discussed with the patient. The patient verbalized understanding of and has agreed to the management plan. Patient is aware to call the clinic if symptoms persist or worsen. Patient is aware when to return to the clinic for a follow-up visit. Patient educated on when it is appropriate to go to the emergency department.   Mary-Margaret Daphine Deutscher, FNP

## 2022-06-05 DIAGNOSIS — J0110 Acute frontal sinusitis, unspecified: Secondary | ICD-10-CM | POA: Diagnosis not present

## 2022-07-05 ENCOUNTER — Encounter: Payer: Self-pay | Admitting: Family

## 2022-07-05 ENCOUNTER — Ambulatory Visit (INDEPENDENT_AMBULATORY_CARE_PROVIDER_SITE_OTHER): Payer: Medicare Other | Admitting: Family

## 2022-07-05 VITALS — BP 135/75 | HR 98 | Temp 97.6°F | Ht 63.0 in | Wt 173.0 lb

## 2022-07-05 DIAGNOSIS — I10 Essential (primary) hypertension: Secondary | ICD-10-CM

## 2022-07-05 DIAGNOSIS — E559 Vitamin D deficiency, unspecified: Secondary | ICD-10-CM

## 2022-07-05 DIAGNOSIS — E669 Obesity, unspecified: Secondary | ICD-10-CM | POA: Diagnosis not present

## 2022-07-05 DIAGNOSIS — J301 Allergic rhinitis due to pollen: Secondary | ICD-10-CM

## 2022-07-05 DIAGNOSIS — R7303 Prediabetes: Secondary | ICD-10-CM

## 2022-07-05 DIAGNOSIS — J019 Acute sinusitis, unspecified: Secondary | ICD-10-CM | POA: Diagnosis not present

## 2022-07-05 DIAGNOSIS — M159 Polyosteoarthritis, unspecified: Secondary | ICD-10-CM

## 2022-07-05 DIAGNOSIS — K2100 Gastro-esophageal reflux disease with esophagitis, without bleeding: Secondary | ICD-10-CM | POA: Diagnosis not present

## 2022-07-05 DIAGNOSIS — E785 Hyperlipidemia, unspecified: Secondary | ICD-10-CM

## 2022-07-05 DIAGNOSIS — F411 Generalized anxiety disorder: Secondary | ICD-10-CM | POA: Diagnosis not present

## 2022-07-05 LAB — CBC WITH DIFFERENTIAL/PLATELET
Basophils Absolute: 0.1 10*3/uL (ref 0.0–0.2)
Basos: 1 %
EOS (ABSOLUTE): 0.2 10*3/uL (ref 0.0–0.4)
Eos: 2 %
Hematocrit: 38.5 % (ref 34.0–46.6)
Hemoglobin: 12.4 g/dL (ref 11.1–15.9)
Immature Grans (Abs): 0 10*3/uL (ref 0.0–0.1)
Immature Granulocytes: 0 %
Lymphocytes Absolute: 2.2 10*3/uL (ref 0.7–3.1)
Lymphs: 32 %
MCH: 29.3 pg (ref 26.6–33.0)
MCHC: 32.2 g/dL (ref 31.5–35.7)
MCV: 91 fL (ref 79–97)
Monocytes Absolute: 0.4 10*3/uL (ref 0.1–0.9)
Monocytes: 6 %
Neutrophils Absolute: 4.1 10*3/uL (ref 1.4–7.0)
Neutrophils: 59 %
Platelets: 232 10*3/uL (ref 150–450)
RBC: 4.23 x10E6/uL (ref 3.77–5.28)
RDW: 12.2 % (ref 11.7–15.4)
WBC: 7 10*3/uL (ref 3.4–10.8)

## 2022-07-05 LAB — CMP14+EGFR
ALT: 17 IU/L (ref 0–32)
AST: 21 IU/L (ref 0–40)
Albumin/Globulin Ratio: 1.6 (ref 1.2–2.2)
Albumin: 4.4 g/dL (ref 3.8–4.8)
Alkaline Phosphatase: 76 IU/L (ref 44–121)
BUN/Creatinine Ratio: 14 (ref 12–28)
BUN: 14 mg/dL (ref 8–27)
Bilirubin Total: 0.3 mg/dL (ref 0.0–1.2)
CO2: 25 mmol/L (ref 20–29)
Calcium: 10.3 mg/dL (ref 8.7–10.3)
Chloride: 99 mmol/L (ref 96–106)
Creatinine, Ser: 1 mg/dL (ref 0.57–1.00)
Globulin, Total: 2.7 g/dL (ref 1.5–4.5)
Glucose: 118 mg/dL — ABNORMAL HIGH (ref 70–99)
Potassium: 3.7 mmol/L (ref 3.5–5.2)
Sodium: 140 mmol/L (ref 134–144)
Total Protein: 7.1 g/dL (ref 6.0–8.5)
eGFR: 57 mL/min/{1.73_m2} — ABNORMAL LOW (ref >59)

## 2022-07-05 LAB — BAYER DCA HB A1C WAIVED: HB A1C (BAYER DCA - WAIVED): 6 % — ABNORMAL HIGH (ref 4.8–5.6)

## 2022-07-05 MED ORDER — CETIRIZINE HCL 10 MG PO TABS: 10.0000 mg | ORAL_TABLET | Freq: Every day | ORAL | 1 refills | Status: DC

## 2022-07-05 MED ORDER — FLUTICASONE PROPIONATE 50 MCG/ACT NA SUSP
2.0000 | Freq: Every day | NASAL | 6 refills | Status: DC
Start: 2022-07-05 — End: 2023-05-22

## 2022-07-05 MED ORDER — AMOXICILLIN-POT CLAVULANATE 875-125 MG PO TABS: 1.0000 | ORAL_TABLET | Freq: Two times a day (BID) | ORAL | 0 refills | Status: DC

## 2022-07-05 NOTE — Progress Notes (Signed)
Subjective:    Patient ID: Stacey [name], female    DOB: 18-Aug-1941, 81 y.o.   MRN: [id number]  Chief Complaint  Patient presents with   Medical Management of Chronic Issues    Flu shot today. Sinus problem on going    Sinusitis   Pt presents to the office today for chronic follow up. She states she her daughter-in-law passed away 12-05-21. Sinusitis This is a recurrent problem. The current episode started 1 to 4 weeks ago. The problem has been gradually worsening since onset. There has been no fever. Her pain is at a severity of 10/10. The pain is moderate. Associated symptoms include congestion, headaches, sinus pressure and sneezing. Pertinent negatives include no ear pain, shortness of breath or sore throat. Past treatments include oral decongestants. The treatment provided mild relief.  Hypertension This is a chronic problem. The current episode started more than 1 year ago. The problem has been resolved since onset. The problem is controlled. Associated symptoms include anxiety, headaches, malaise/fatigue and peripheral edema. Pertinent negatives include no blurred vision or shortness of breath. Risk factors for coronary artery disease include dyslipidemia, obesity and sedentary lifestyle. Past treatments include diuretics. The current treatment provides moderate improvement.  Gastroesophageal Reflux She complains of belching and heartburn. She reports no sore throat. This is a chronic problem. The current episode started more than 1 year ago. The problem occurs rarely. The problem has been resolved. Risk factors include obesity. She has tried an antacid for the symptoms. The treatment provided significant relief.  Arthritis Presents for follow-up visit. She complains of pain and stiffness. Affected locations include the right knee, left MCP, right MCP and left knee. Her pain is at a severity of 10/10.  Hyperlipidemia This is a chronic problem. The current episode started more than 1  year ago. The problem is controlled. Exacerbating diseases include obesity. Pertinent negatives include no shortness of breath. Current antihyperlipidemic treatment includes statins. The current treatment provides moderate improvement of lipids. Risk factors for coronary artery disease include dyslipidemia, hypertension, a sedentary lifestyle and post-menopausal.  Anxiety Presents for follow-up visit. Symptoms include depressed mood, excessive worry, irritability and nervous/anxious behavior. Patient reports no shortness of breath. Symptoms occur occasionally. The severity of symptoms is moderate.    Diabetes She presents for her follow-up diabetic visit. She has type 2 (prediabetes) diabetes mellitus. Hypoglycemia symptoms include headaches and nervousness/anxiousness. Pertinent negatives for diabetes include no blurred vision and no foot paresthesias. (Does not check BS at home )      Review of Systems  Constitutional:  Positive for irritability and malaise/fatigue.  HENT:  Positive for congestion, sinus pressure and sneezing. Negative for ear pain and sore throat.   Eyes:  Negative for blurred vision.  Respiratory:  Negative for shortness of breath.   Gastrointestinal:  Positive for heartburn.  Musculoskeletal:  Positive for arthritis and stiffness.  Neurological:  Positive for headaches.  Psychiatric/Behavioral:  The patient is nervous/anxious.   All other systems reviewed and are negative.      Objective:   Physical Exam Vitals reviewed.  Constitutional:      General: She is not in acute distress.    Appearance: She is well-developed. She is obese.  HENT:     Head: Normocephalic and atraumatic.     Right Ear: Tympanic membrane normal.     Left Ear: Tympanic membrane normal.  Eyes:     Pupils: Pupils are equal, round, and reactive to light.  Neck:  Thyroid: No thyromegaly.  Cardiovascular:     Rate and Rhythm: Normal rate and regular rhythm.     Heart sounds: Normal  heart sounds. No murmur heard. Pulmonary:     Effort: Pulmonary effort is normal. No respiratory distress.     Breath sounds: Normal breath sounds. No wheezing.  Abdominal:     General: Bowel sounds are normal. There is no distension.     Palpations: Abdomen is soft.     Tenderness: There is no abdominal tenderness.  Musculoskeletal:        General: No tenderness. Normal range of motion.     Cervical back: Normal range of motion and neck supple.  Skin:    General: Skin is warm and dry.  Neurological:     Mental Status: She is alert and oriented to person, place, and time.     Cranial Nerves: No cranial nerve deficit.     Deep Tendon Reflexes: Reflexes are normal and symmetric.  Psychiatric:        Behavior: Behavior normal.        Thought Content: Thought content normal.        Judgment: Judgment normal.       BP 135/75   Pulse 98   Temp 97.6 F (36.4 C) (Temporal)   Ht '5\' 3"'  (1.6 m)   Wt 173 lb (78.5 kg)   SpO2 98%   BMI 30.65 kg/m      Assessment & Plan:   Stacey [name] comes in today with chief complaint of Medical Management of Chronic Issues (Flu shot today. Sinus problem on going ) and Sinusitis   Diagnosis and orders addressed:  1. Primary hypertension - CMP14+EGFR - CBC with Differential/Platelet  2. Gastroesophageal reflux disease with esophagitis, unspecified whether hemorrhage - CMP14+EGFR - CBC with Differential/Platelet  3. Primary osteoarthritis involving multiple joints - CMP14+EGFR - CBC with Differential/Platelet  4. Vitamin D deficiency - CMP14+EGFR - CBC with Differential/Platelet  5. GAD (generalized anxiety disorder) - CMP14+EGFR - CBC with Differential/Platelet  6. Hyperlipidemia, unspecified hyperlipidemia type - CMP14+EGFR - CBC with Differential/Platelet  7. Obesity (BMI 30-39.9) - CMP14+EGFR - CBC with Differential/Platelet  8. Acute sinusitis, recurrence not specified, unspecified location - Take meds as  prescribed - Use a cool mist humidifier  -Use saline nose sprays frequently -Force fluids -For any cough or congestion  Use plain Mucinex- regular strength or max strength is fine -For fever or aces or pains- take tylenol or ibuprofen. -Throat lozenges if help -Follow up if symptoms worsen or do not improve  - amoxicillin-clavulanate (AUGMENTIN) 875-125 MG tablet; Take 1 tablet by mouth 2 (two) times daily.  Dispense: 14 tablet; Refill: 0 - cetirizine (ZYRTEC ALLERGY) 10 MG tablet; Take 1 tablet (10 mg total) by mouth daily.  Dispense: 90 tablet; Refill: 1 - fluticasone (FLONASE) 50 MCG/ACT nasal spray; Place 2 sprays into both nostrils daily.  Dispense: 16 g; Refill: 6 - CMP14+EGFR - CBC with Differential/Platelet  9. Allergic rhinitis due to pollen, unspecified seasonality Stop Claritin and start zyrtec and flonase  - cetirizine (ZYRTEC ALLERGY) 10 MG tablet; Take 1 tablet (10 mg total) by mouth daily.  Dispense: 90 tablet; Refill: 1 - fluticasone (FLONASE) 50 MCG/ACT nasal spray; Place 2 sprays into both nostrils daily.  Dispense: 16 g; Refill: 6 - CMP14+EGFR - CBC with Differential/Platelet  10. Prediabetes - Bayer DCA Hb A1c Waived - CMP14+EGFR - CBC with Differential/Platelet   Labs pending Health Maintenance reviewed Diet and exercise encouraged  Follow up plan: 4 months    Evelina Dun, FNP

## 2022-07-05 NOTE — Patient Instructions (Signed)
Health Maintenance After Age 80 After age 80, you are at a higher risk for certain long-term diseases and infections as well as injuries from falls. Falls are a major cause of broken bones and head injuries in people who are older than age 80. Getting regular preventive care can help to keep you healthy and well. Preventive care includes getting regular testing and making lifestyle changes as recommended by your health care provider. Talk with your health care provider about: Which screenings and tests you should have. A screening is a test that checks for a disease when you have no symptoms. A diet and exercise plan that is right for you. What should I know about screenings and tests to prevent falls? Screening and testing are the best ways to find a health problem early. Early diagnosis and treatment give you the best chance of managing medical conditions that are common after age 80. Certain conditions and lifestyle choices may make you more likely to have a fall. Your health care provider may recommend: Regular vision checks. Poor vision and conditions such as cataracts can make you more likely to have a fall. If you wear glasses, make sure to get your prescription updated if your vision changes. Medicine review. Work with your health care provider to regularly review all of the medicines you are taking, including over-the-counter medicines. Ask your health care provider about any side effects that may make you more likely to have a fall. Tell your health care provider if any medicines that you take make you feel dizzy or sleepy. Strength and balance checks. Your health care provider may recommend certain tests to check your strength and balance while standing, walking, or changing positions. Foot health exam. Foot pain and numbness, as well as not wearing proper footwear, can make you more likely to have a fall. Screenings, including: Osteoporosis screening. Osteoporosis is a condition that causes  the bones to get weaker and break more easily. Blood pressure screening. Blood pressure changes and medicines to control blood pressure can make you feel dizzy. Depression screening. You may be more likely to have a fall if you have a fear of falling, feel depressed, or feel unable to do activities that you used to do. Alcohol use screening. Using too much alcohol can affect your balance and may make you more likely to have a fall. Follow these instructions at home: Lifestyle Do not drink alcohol if: Your health care provider tells you not to drink. If you drink alcohol: Limit how much you have to: 0-1 drink a day for women. 0-2 drinks a day for men. Know how much alcohol is in your drink. In the [place] one drink equals one 12 oz bottle of beer (355 mL), one 5 oz glass of wine (148 mL), or one 1 oz glass of hard liquor (44 mL). Do not use any products that contain nicotine or tobacco. These products include cigarettes, chewing tobacco, and vaping devices, such as e-cigarettes. If you need help quitting, ask your health care provider. Activity  Follow a regular exercise program to stay fit. This will help you maintain your balance. Ask your health care provider what types of exercise are appropriate for you. If you need a cane or walker, use it as recommended by your health care provider. Wear supportive shoes that have nonskid soles. Safety  Remove any tripping hazards, such as rugs, cords, and clutter. Install safety equipment such as grab bars in bathrooms and safety rails on stairs. Keep rooms and walkways   well-lit. General instructions Talk with your health care provider about your risks for falling. Tell your health care provider if: You fall. Be sure to tell your health care provider about all falls, even ones that seem minor. You feel dizzy, tiredness (fatigue), or off-balance. Take over-the-counter and prescription medicines only as told by your health care provider. These include  supplements. Eat a healthy diet and maintain a healthy weight. A healthy diet includes low-fat dairy products, low-fat (lean) meats, and fiber from whole grains, beans, and lots of fruits and vegetables. Stay current with your vaccines. Schedule regular health, dental, and eye exams. Summary Having a healthy lifestyle and getting preventive care can help to protect your health and wellness after age 80. Screening and testing are the best way to find a health problem early and help you avoid having a fall. Early diagnosis and treatment give you the best chance for managing medical conditions that are more common for people who are older than age 80. Falls are a major cause of broken bones and head injuries in people who are older than age 80. Take precautions to prevent a fall at home. Work with your health care provider to learn what changes you can make to improve your health and wellness and to prevent falls. This information is not intended to replace advice given to you by your health care provider. Make sure you discuss any questions you have with your health care provider. Document Revised: 02/12/2021 Document Reviewed: 02/12/2021 Elsevier Patient Education  2023 [employer]  

## 2022-08-05 DIAGNOSIS — Z23 Encounter for immunization: Secondary | ICD-10-CM | POA: Diagnosis not present

## 2022-11-04 ENCOUNTER — Ambulatory Visit (INDEPENDENT_AMBULATORY_CARE_PROVIDER_SITE_OTHER): Payer: Medicare Other | Admitting: Family

## 2022-11-04 ENCOUNTER — Encounter: Payer: Self-pay | Admitting: Family

## 2022-11-04 VITALS — BP 134/83 | HR 87 | Temp 98.4°F | Ht 63.0 in | Wt 172.0 lb

## 2022-11-04 DIAGNOSIS — I10 Essential (primary) hypertension: Secondary | ICD-10-CM

## 2022-11-04 DIAGNOSIS — G8929 Other chronic pain: Secondary | ICD-10-CM

## 2022-11-04 DIAGNOSIS — M159 Polyosteoarthritis, unspecified: Secondary | ICD-10-CM | POA: Diagnosis not present

## 2022-11-04 DIAGNOSIS — R7303 Prediabetes: Secondary | ICD-10-CM

## 2022-11-04 DIAGNOSIS — E785 Hyperlipidemia, unspecified: Secondary | ICD-10-CM | POA: Diagnosis not present

## 2022-11-04 DIAGNOSIS — E669 Obesity, unspecified: Secondary | ICD-10-CM

## 2022-11-04 DIAGNOSIS — M5441 Lumbago with sciatica, right side: Secondary | ICD-10-CM | POA: Diagnosis not present

## 2022-11-04 DIAGNOSIS — R600 Localized edema: Secondary | ICD-10-CM | POA: Diagnosis not present

## 2022-11-04 DIAGNOSIS — E559 Vitamin D deficiency, unspecified: Secondary | ICD-10-CM

## 2022-11-04 DIAGNOSIS — F411 Generalized anxiety disorder: Secondary | ICD-10-CM | POA: Diagnosis not present

## 2022-11-04 DIAGNOSIS — Z6830 Body mass index (BMI) 30.0-30.9, adult: Secondary | ICD-10-CM

## 2022-11-04 LAB — LIPID PANEL

## 2022-11-04 LAB — BAYER DCA HB A1C WAIVED: HB A1C (BAYER DCA - WAIVED): 6.1 % — ABNORMAL HIGH (ref 4.8–5.6)

## 2022-11-04 NOTE — Patient Instructions (Signed)
Health Maintenance After Age 81 After age 81, you are at a higher risk for certain long-term diseases and infections as well as injuries from falls. Falls are a major cause of broken bones and head injuries in people who are older than age 81. Getting regular preventive care can help to keep you healthy and well. Preventive care includes getting regular testing and making lifestyle changes as recommended by your health care provider. Talk with your health care provider about: Which screenings and tests you should have. A screening is a test that checks for a disease when you have no symptoms. A diet and exercise plan that is right for you. What should I know about screenings and tests to prevent falls? Screening and testing are the best ways to find a health problem early. Early diagnosis and treatment give you the best chance of managing medical conditions that are common after age 81. Certain conditions and lifestyle choices may make you more likely to have a fall. Your health care provider may recommend: Regular vision checks. Poor vision and conditions such as cataracts can make you more likely to have a fall. If you wear glasses, make sure to get your prescription updated if your vision changes. Medicine review. Work with your health care provider to regularly review all of the medicines you are taking, including over-the-counter medicines. Ask your health care provider about any side effects that may make you more likely to have a fall. Tell your health care provider if any medicines that you take make you feel dizzy or sleepy. Strength and balance checks. Your health care provider may recommend certain tests to check your strength and balance while standing, walking, or changing positions. Foot health exam. Foot pain and numbness, as well as not wearing proper footwear, can make you more likely to have a fall. Screenings, including: Osteoporosis screening. Osteoporosis is a condition that causes  the bones to get weaker and break more easily. Blood pressure screening. Blood pressure changes and medicines to control blood pressure can make you feel dizzy. Depression screening. You may be more likely to have a fall if you have a fear of falling, feel depressed, or feel unable to do activities that you used to do. Alcohol use screening. Using too much alcohol can affect your balance and may make you more likely to have a fall. Follow these instructions at home: Lifestyle Do not drink alcohol if: Your health care provider tells you not to drink. If you drink alcohol: Limit how much you have to: 0-1 drink a day for women. 0-2 drinks a day for men. Know how much alcohol is in your drink. In the [place] one drink equals one 12 oz bottle of beer (355 mL), one 5 oz glass of wine (148 mL), or one 1 oz glass of hard liquor (44 mL). Do not use any products that contain nicotine or tobacco. These products include cigarettes, chewing tobacco, and vaping devices, such as e-cigarettes. If you need help quitting, ask your health care provider. Activity  Follow a regular exercise program to stay fit. This will help you maintain your balance. Ask your health care provider what types of exercise are appropriate for you. If you need a cane or walker, use it as recommended by your health care provider. Wear supportive shoes that have nonskid soles. Safety  Remove any tripping hazards, such as rugs, cords, and clutter. Install safety equipment such as grab bars in bathrooms and safety rails on stairs. Keep rooms and walkways   well-lit. General instructions Talk with your health care provider about your risks for falling. Tell your health care provider if: You fall. Be sure to tell your health care provider about all falls, even ones that seem minor. You feel dizzy, tiredness (fatigue), or off-balance. Take over-the-counter and prescription medicines only as told by your health care provider. These include  supplements. Eat a healthy diet and maintain a healthy weight. A healthy diet includes low-fat dairy products, low-fat (lean) meats, and fiber from whole grains, beans, and lots of fruits and vegetables. Stay current with your vaccines. Schedule regular health, dental, and eye exams. Summary Having a healthy lifestyle and getting preventive care can help to protect your health and wellness after age 81. Screening and testing are the best way to find a health problem early and help you avoid having a fall. Early diagnosis and treatment give you the best chance for managing medical conditions that are more common for people who are older than age 81. Falls are a major cause of broken bones and head injuries in people who are older than age 81. Take precautions to prevent a fall at home. Work with your health care provider to learn what changes you can make to improve your health and wellness and to prevent falls. This information is not intended to replace advice given to you by your health care provider. Make sure you discuss any questions you have with your health care provider. Document Revised: 02/12/2021 Document Reviewed: 02/12/2021 Elsevier Patient Education  2023 [employer]  

## 2022-11-04 NOTE — Progress Notes (Signed)
Subjective:    Patient ID: Stacey [name], female    DOB: 12/22/1940, 82 y.o.   MRN: [id number]  Chief Complaint  Patient presents with   Medical Management of Chronic Issues   Pt presents to the office today for chronic follow up. She states she her daughter-in-law passed away 11/16/21.  Hypertension This is a chronic problem. The current episode started more than 1 year ago. The problem has been resolved since onset. The problem is controlled. Associated symptoms include anxiety and peripheral edema. Pertinent negatives include no malaise/fatigue or shortness of breath. Risk factors for coronary artery disease include dyslipidemia, diabetes mellitus and sedentary lifestyle. The current treatment provides moderate improvement.  Gastroesophageal Reflux She complains of belching and heartburn. She reports no globus sensation or no stridor. This is a chronic problem. The current episode started more than 1 year ago. The problem occurs occasionally. She has tried an antacid for the symptoms. The treatment provided moderate relief.  Arthritis Presents for follow-up visit. She complains of pain and stiffness. Affected locations include the right knee and left knee. Her pain is at a severity of 10/10.  Hyperlipidemia This is a chronic problem. The current episode started more than 1 year ago. The problem is controlled. Recent lipid tests were reviewed and are normal. Exacerbating diseases include obesity. Pertinent negatives include no shortness of breath. Current antihyperlipidemic treatment includes statins. The current treatment provides moderate improvement of lipids. Risk factors for coronary artery disease include dyslipidemia, diabetes mellitus, hypertension and a sedentary lifestyle.  Anxiety Presents for follow-up visit. Symptoms include excessive worry, irritability and nervous/anxious behavior. Patient reports no shortness of breath. Symptoms occur occasionally. The severity of symptoms is  moderate.    Back Pain This is a chronic problem. The current episode started more than 1 year ago. The problem occurs intermittently. The problem has been waxing and waning since onset. The pain is present in the lumbar spine. The quality of the pain is described as aching. The pain is at a severity of 5/10. The pain is moderate. The treatment provided moderate relief.      Review of Systems  Constitutional:  Positive for irritability. Negative for malaise/fatigue.  Respiratory:  Negative for shortness of breath.   Gastrointestinal:  Positive for heartburn.  Musculoskeletal:  Positive for arthritis, back pain and stiffness.  Psychiatric/Behavioral:  The patient is nervous/anxious.   All other systems reviewed and are negative.      Objective:   Physical Exam Vitals reviewed.  Constitutional:      General: She is not in acute distress.    Appearance: She is well-developed. She is obese.  HENT:     Head: Normocephalic and atraumatic.     Right Ear: Tympanic membrane normal.     Left Ear: Tympanic membrane normal.  Eyes:     Pupils: Pupils are equal, round, and reactive to light.  Neck:     Thyroid: No thyromegaly.  Cardiovascular:     Rate and Rhythm: Normal rate and regular rhythm.     Heart sounds: Normal heart sounds. No murmur heard. Pulmonary:     Effort: Pulmonary effort is normal. No respiratory distress.     Breath sounds: Normal breath sounds. No wheezing.  Abdominal:     General: Bowel sounds are normal. There is no distension.     Palpations: Abdomen is soft.     Tenderness: There is no abdominal tenderness.  Musculoskeletal:        General: No tenderness. Normal  range of motion.     Cervical back: Normal range of motion and neck supple.     Right lower leg: Edema (3+ in ankles) present.     Left lower leg: Edema (3+ in ankles) present.  Skin:    General: Skin is warm and dry.  Neurological:     Mental Status: She is alert and oriented to person, place,  and time.     Cranial Nerves: No cranial nerve deficit.     Deep Tendon Reflexes: Reflexes are normal and symmetric.  Psychiatric:        Behavior: Behavior normal.        Thought Content: Thought content normal.        Judgment: Judgment normal.      BP 134/83   Pulse 87   Temp 98.4 F (36.9 C)   Ht 5\' 3"  (1.6 m)   Wt 172 lb (78 kg)   SpO2 94%   BMI 30.47 kg/m      Assessment & Plan:   Stacey [name] comes in today with chief complaint of Medical Management of Chronic Issues   Diagnosis and orders addressed:  1. Prediabetes - Bayer DCA Hb A1c Waived - CMP14+EGFR - Microalbumin / creatinine urine ratio - CBC with Differential/Platelet  2. GAD (generalized anxiety disorder) - CMP14+EGFR - CBC with Differential/Platelet  3. Bilateral lower extremity edema - CMP14+EGFR - CBC with Differential/Platelet  4. Chronic bilateral low back pain with right-sided sciatica - CMP14+EGFR - CBC with Differential/Platelet  5. Hyperlipidemia, unspecified hyperlipidemia type - CMP14+EGFR - CBC with Differential/Platelet - Lipid panel  6. Primary hypertension - CMP14+EGFR - CBC with Differential/Platelet  7. Obesity (BMI 30-39.9) - CMP14+EGFR - CBC with Differential/Platelet  8. Primary osteoarthritis involving multiple joints - CMP14+EGFR - CBC with Differential/Platelet  9. Vitamin D deficiency  - CMP14+EGFR - CBC with Differential/Platelet   Labs pending Health Maintenance reviewed Diet and exercise encouraged  Follow up plan: 3 months   Stacey Dun, FNP

## 2022-11-05 LAB — CBC WITH DIFFERENTIAL/PLATELET
Basophils Absolute: 0.1 10*3/uL (ref 0.0–0.2)
Basos: 1 %
EOS (ABSOLUTE): 0.2 10*3/uL (ref 0.0–0.4)
Eos: 2 %
Hematocrit: 38.5 % (ref 34.0–46.6)
Hemoglobin: 12.8 g/dL (ref 11.1–15.9)
Immature Grans (Abs): 0 10*3/uL (ref 0.0–0.1)
Immature Granulocytes: 0 %
Lymphocytes Absolute: 2.6 10*3/uL (ref 0.7–3.1)
Lymphs: 35 %
MCH: 29.7 pg (ref 26.6–33.0)
MCHC: 33.2 g/dL (ref 31.5–35.7)
MCV: 89 fL (ref 79–97)
Monocytes Absolute: 0.4 10*3/uL (ref 0.1–0.9)
Monocytes: 6 %
Neutrophils Absolute: 4.2 10*3/uL (ref 1.4–7.0)
Neutrophils: 56 %
Platelets: 205 10*3/uL (ref 150–450)
RBC: 4.31 x10E6/uL (ref 3.77–5.28)
RDW: 12.2 % (ref 11.7–15.4)
WBC: 7.4 10*3/uL (ref 3.4–10.8)

## 2022-11-05 LAB — CMP14+EGFR
ALT: 13 IU/L (ref 0–32)
AST: 21 IU/L (ref 0–40)
Albumin/Globulin Ratio: 1.9 (ref 1.2–2.2)
Albumin: 4.8 g/dL (ref 3.8–4.8)
Alkaline Phosphatase: 80 IU/L (ref 44–121)
BUN/Creatinine Ratio: 27 (ref 12–28)
BUN: 24 mg/dL (ref 8–27)
Bilirubin Total: 0.2 mg/dL (ref 0.0–1.2)
CO2: 22 mmol/L (ref 20–29)
Calcium: 10.6 mg/dL — ABNORMAL HIGH (ref 8.7–10.3)
Chloride: 99 mmol/L (ref 96–106)
Creatinine, Ser: 0.89 mg/dL (ref 0.57–1.00)
Globulin, Total: 2.5 g/dL (ref 1.5–4.5)
Glucose: 126 mg/dL — ABNORMAL HIGH (ref 70–99)
Potassium: 3.6 mmol/L (ref 3.5–5.2)
Sodium: 141 mmol/L (ref 134–144)
Total Protein: 7.3 g/dL (ref 6.0–8.5)
eGFR: 65 mL/min/{1.73_m2} (ref >59)

## 2022-11-05 LAB — LIPID PANEL
Chol/HDL Ratio: 2.8 ratio (ref 0.0–4.4)
Cholesterol, Total: 175 mg/dL (ref 100–199)
HDL: 62 mg/dL (ref >39)
LDL Chol Calc (NIH): 85 mg/dL (ref 0–99)
Triglycerides: 163 mg/dL — ABNORMAL HIGH (ref 0–149)
VLDL Cholesterol Cal: 28 mg/dL (ref 5–40)

## 2022-11-06 LAB — MICROALBUMIN / CREATININE URINE RATIO
Creatinine, Urine: 100.8 mg/dL
Microalb/Creat Ratio: 4 mg/g creat (ref 0–29)
Microalbumin, Urine: 4.4 ug/mL

## 2022-12-02 ENCOUNTER — Other Ambulatory Visit: Payer: Self-pay | Admitting: Family

## 2022-12-02 DIAGNOSIS — I10 Essential (primary) hypertension: Secondary | ICD-10-CM

## 2022-12-30 ENCOUNTER — Ambulatory Visit (INDEPENDENT_AMBULATORY_CARE_PROVIDER_SITE_OTHER): Payer: Medicare Other

## 2022-12-30 VITALS — Ht 63.0 in | Wt 172.0 lb

## 2022-12-30 DIAGNOSIS — Z0000 Encounter for general adult medical examination without abnormal findings: Secondary | ICD-10-CM

## 2022-12-30 DIAGNOSIS — J301 Allergic rhinitis due to pollen: Secondary | ICD-10-CM

## 2022-12-30 DIAGNOSIS — J019 Acute sinusitis, unspecified: Secondary | ICD-10-CM

## 2022-12-30 MED ORDER — CETIRIZINE HCL 10 MG PO TABS: 10.0000 mg | ORAL_TABLET | Freq: Every day | ORAL | 1 refills | Status: DC

## 2022-12-30 NOTE — Progress Notes (Signed)
Subjective:   Stacey [name] is a 82 y.o. female who presents for Medicare Annual (Subsequent) preventive examination.  I connected with  Stacey [name] on 12/30/22 by a audio enabled telemedicine application and verified that I am speaking with the correct person using two identifiers.  Patient Location: Home  Provider Location: Home Office  I discussed the limitations of evaluation and management by telemedicine. The patient expressed understanding and agreed to proceed.  Review of Systems     Cardiac Risk Factors include: advanced age (>27men, >43 women);dyslipidemia;sedentary lifestyle     Objective:    Today's Vitals   12/30/22 1723  Weight: 172 lb (78 kg)  Height: 5\' 3"  (1.6 m)   Body mass index is 30.47 kg/m.     12/30/2022    7:12 PM 07/17/2021    4:57 PM 07/14/2021   11:17 AM 07/04/2021    6:59 AM 05/10/2021    4:00 PM 05/27/2013    3:21 PM 09/25/2011    3:00 AM  Advanced Directives  Does Patient Have a Medical Advance Directive? Yes No No No No Patient does not have advance directive;Patient would not like information Patient does not have advance directive;Patient would not like information  Type of Scientist, forensic Power of [place];Living will        Does patient want to make changes to medical advance directive? No - Patient declined        Copy of [place] in Chart? No - copy requested        Would patient like information on creating a medical advance directive?   No - Patient declined No - Patient declined No - Patient declined      Current Medications (verified) Outpatient Encounter Medications as of 12/30/2022  Medication Sig   acetaminophen (TYLENOL) 325 MG tablet Take 650 mg by mouth every 6 (six) hours as needed for moderate pain or fever.   atorvastatin (LIPITOR) 20 MG tablet Take 1 tablet (20 mg total) by mouth daily.   cetirizine (ZYRTEC ALLERGY) 10 MG tablet Take 1 tablet (10 mg total) by mouth daily.    diclofenac (VOLTAREN) 75 MG EC tablet Take 1 tablet (75 mg total) by mouth 2 (two) times daily.   fish oil-omega-3 fatty acids 1000 MG capsule Take 1 g by mouth 2 (two) times daily.   fluticasone (FLONASE) 50 MCG/ACT nasal spray Place 2 sprays into both nostrils daily.   hydrochlorothiazide (HYDRODIURIL) 25 MG tablet Take 1 tablet by mouth once daily   meloxicam (MOBIC) 15 MG tablet Take 1 tablet (15 mg total) by mouth daily.   Misc. Devices (CLASSICS ROLLING WALKER) MISC 1 rolling walker for home use   Multiple Vitamin (MULITIVITAMIN WITH MINERALS) TABS Take 1 tablet by mouth daily.   oxymetazoline (AFRIN) 0.05 % nasal spray Place 2 sprays into both nostrils 2 (two) times daily as needed for congestion.   No facility-administered encounter medications on file as of 12/30/2022.    Allergies (verified) Clindamycin/lincomycin, Colchicine, Other, Phenergan [promethazine hcl], and Statins   History: Past Medical History:  Diagnosis Date   Arthritis    "knees" (05/27/2013)   Bowel obstruction (HCC)    Chronic lower back pain    "bulging discs" (05/27/2013)   GERD (gastroesophageal reflux disease)    Headache(784.0)    "maybe once/wk' (05/27/2013)   Hyperlipidemia    Hypertension    Past Surgical History:  Procedure Laterality Date   ABDOMINAL HYSTERECTOMY  1980's   APPENDECTOMY  1980's   BILATERAL OOPHORECTOMY Bilateral 2000   COLON SURGERY  10/09/2003   small bowel resection/notes 10/09/2003 (05/27/2013)   INGUINAL HERNIA REPAIR  10/09/2003; 2006   Laparotomy with extensive lysis of adhesions,/notes 10/09/2003 (05/27/2013);    Family History  Problem Relation Age of Onset   Heart disease Mother    Stroke Father    Hypertension Sister    Hypertension Brother    Hypertension Sister    Hypertension Sister    Hypertension Brother    Hypertension Brother    Social History   Socioeconomic History   Marital status: Widowed    Spouse name: Not on file   Number of children: 2   Years of  education: Not on file   Highest education level: Not on file  Occupational History   Occupation: retired  Tobacco Use   Smoking status: Former    Packs/day: 0.25    Years: 25.00    Additional pack years: 0.00    Total pack years: 6.25    Types: Cigarettes   Smokeless tobacco: Never  Vaping Use   Vaping Use: Never used  Substance and Sexual Activity   Alcohol use: No   Drug use: No   Sexual activity: Never  Other Topics Concern   Not on file  Social History Narrative   She lives alone in [place] - ground level apartment. Her 2 sons live in [place] Determinants of Health   Financial Resource Strain: Low Risk  (12/30/2022)   Overall Financial Resource Strain (CARDIA)    Difficulty of Paying Living Expenses: Not hard at all  Food Insecurity: No Food Insecurity (12/30/2022)   Hunger Vital Sign    Worried About Running Out of Food in the Last Year: Never true    Ran Out of Food in the Last Year: Never true  Transportation Needs: No Transportation Needs (12/30/2022)   PRAPARE - Hydrologist (Medical): No    Lack of Transportation (Non-Medical): No  Physical Activity: Inactive (12/30/2022)   Exercise Vital Sign    Days of Exercise per Week: 0 days    Minutes of Exercise per Session: 0 min  Stress: No Stress Concern Present (12/30/2022)   [place]    Feeling of Stress : Not at all  Social Connections: Socially Isolated (12/30/2022)   Social Connection and Isolation Panel [NHANES]    Frequency of Communication with Friends and Family: More than three times a week    Frequency of Social Gatherings with Friends and Family: Three times a week    Attends Religious Services: Never    Active Member of Clubs or Organizations: No    Attends Archivist Meetings: Never    Marital Status: Widowed    Tobacco Counseling Counseling given: Not Answered   Clinical  Intake:  Pre-visit preparation completed: Yes  Pain : No/denies pain  Diabetes: No  How often do you need to have someone help you when you read instructions, pamphlets, or other written materials from your doctor or pharmacy?: 1 - Never  Diabetic?No   Interpreter Needed?: No  Information entered by :: Denman George LPN   Activities of Daily Living    12/30/2022    7:15 PM  In your present state of health, do you have any difficulty performing the following activities:  Hearing? 0  Vision? 0  Difficulty concentrating or making decisions? 0  Walking or climbing stairs? 0  Dressing or bathing? 0  Doing errands, shopping? 0  Preparing Food and eating ? N  Using the Toilet? N  In the past six months, have you accidently leaked urine? N  Do you have problems with loss of bowel control? N  Managing your Medications? N  Managing your Finances? N  Housekeeping or managing your Housekeeping? N    Patient Care Team: Sharion Balloon, FNP as PCP - General (Nurse Practitioner)  Indicate any recent Medical Services you may have received from other than [facility] providers in the past year (date may be approximate).     Assessment:   This is a routine wellness examination for Shamarah.  Hearing/Vision screen Hearing Screening - Comments:: Denies hearing difficulties  Vision Screening - Comments:: up to date with routine eye exams    Dietary issues and exercise activities discussed: Current Exercise Habits: The patient does not participate in regular exercise at present   Goals Addressed   None   Depression Screen    12/30/2022    7:10 PM 11/04/2022   10:31 AM 06/03/2022    9:36 AM 03/05/2022   10:05 AM 12/04/2021   10:22 AM 09/04/2021    9:42 AM 05/31/2021   10:29 AM  PHQ 2/9 Scores  PHQ - 2 Score 0 0 0 0 0 0 0  PHQ- 9 Score 0 0 0 0   0    Fall Risk    12/30/2022    5:28 PM 11/04/2022   10:32 AM 06/03/2022    9:36 AM 03/05/2022   10:05 AM 12/04/2021   10:22 AM  Fall  Risk   Falls in the past year? 1  0 0 0  Number falls in past yr: 0 0     Injury with Fall? 0 0     Risk for fall due to : No Fall Risks No Fall Risks     Follow up Falls prevention discussed;Education provided;Falls evaluation completed Falls evaluation completed Falls evaluation completed      FALL RISK PREVENTION PERTAINING TO THE HOME:  Any stairs in or around the home? No  If so, are there any without handrails? No  Home free of loose throw rugs in walkways, pet beds, electrical cords, etc? Yes  Adequate lighting in your home to reduce risk of falls? Yes   ASSISTIVE DEVICES UTILIZED TO PREVENT FALLS:  Life alert? No  Use of a cane, walker or w/c? No  Grab bars in the bathroom? Yes  Shower chair or bench in shower? No  Elevated toilet seat or a handicapped toilet? Yes   TIMED UP AND GO:  Was the test performed? No . Telephonic visit   Cognitive Function:        12/30/2022    7:15 PM 05/10/2021    4:02 PM  6CIT Screen  What Year? 0 points 0 points  What month? 0 points 0 points  What time? 0 points 0 points  Count back from 20 0 points 0 points  Months in reverse 0 points 0 points  Repeat phrase 0 points 0 points  Total Score 0 points 0 points    Immunizations Immunization History  Administered Date(s) Administered   Fluad Quad(high Dose 65+) 07/08/2019, 07/07/2022   Influenza, High Dose Seasonal PF 07/02/2016, 07/29/2017   Influenza,inj,Quad PF,6+ Mos 07/04/2015   Influenza-Unspecified 07/07/2013, 07/14/2014, 07/26/2018, 08/23/2021   Moderna Sars-Covid-2 Vaccination 01/05/2020, 01/31/2020, 08/22/2020   Pneumococcal Conjugate-13 07/04/2015   Pneumococcal Polysaccharide-23 02/06/2017    TDAP status: Due, Education  has been provided regarding the importance of this vaccine. Advised may receive this vaccine at local pharmacy or Health Dept. Aware to provide a copy of the vaccination record if obtained from local pharmacy or Health Dept. Verbalized acceptance and  understanding.  Flu Vaccine status: Up to date  Pneumococcal vaccine status: Up to date  Covid-19 vaccine status: Information provided on how to obtain vaccines.   Qualifies for Shingles Vaccine? Yes   Zostavax completed No   Shingrix Completed?: No.    Education has been provided regarding the importance of this vaccine. Patient has been advised to call insurance company to determine out of pocket expense if they have not yet received this vaccine. Advised may also receive vaccine at local pharmacy or Health Dept. Verbalized acceptance and understanding.  Screening Tests Health Maintenance  Topic Date Due   DTaP/Tdap/Td (1 - Tdap) Never done   DEXA SCAN  Never done   COVID-19 Vaccine (4 - 2023-24 season) 06/07/2022   Zoster Vaccines- Shingrix (1 of 2) 02/03/2023 (Originally [birth year]1992)   Medicare Annual Wellness (AWV)  12/30/2023   Pneumonia Vaccine 31+ Years old  Completed   INFLUENZA VACCINE  Completed   HPV VACCINES  Aged Out    Health Maintenance  Health Maintenance Due  Topic Date Due   DTaP/Tdap/Td (1 - Tdap) Never done   DEXA SCAN  Never done   COVID-19 Vaccine (4 - 2023-24 season) 06/07/2022    Colorectal cancer screening: No longer required.   Mammogram status: No longer required due to age.  Bone density stat: Patient declines at this time   Lung Cancer Screening: (Low Dose CT Chest recommended if Age 1-80 years, 30 pack-year currently smoking OR have quit w/in 15years.) does not qualify.   Lung Cancer Screening Referral: n/a  Additional Screening:  Hepatitis C Screening: does not qualif  Vision Screening: Recommended annual ophthalmology exams for early detection of glaucoma and other disorders of the eye. Is the patient up to date with their annual eye exam?  No  Who is the provider or what is the name of the office in which the patient attends annual eye exams? None  If pt is not established with a provider, would they like to be referred to a provider  to establish care? No .   Dental Screening: Recommended annual dental exams for proper oral hygiene  Community Resource Referral / Chronic Care Management: CRR required this visit?  No   CCM required this visit?  No      Plan:     I have personally reviewed and noted the following in the patient's chart:   Medical and social history Use of alcohol, tobacco or illicit drugs  Current medications and supplements including opioid prescriptions. Patient is not currently taking opioid prescriptions. Functional ability and status Nutritional status Physical activity Advanced directives List of other physicians Hospitalizations, surgeries, and ER visits in previous 12 months Vitals Screenings to include cognitive, depression, and falls Referrals and appointments  In addition, I have reviewed and discussed with patient certain preventive protocols, quality metrics, and best practice recommendations. A written personalized care plan for preventive services as well as general preventive health recommendations were provided to patient.     Vanetta Mulders, [place]   [id number]   Due to this being a virtual visit, the after visit summary with patients personalized plan was offered to patient via mail or my-chart.  per request, patient was mailed a copy of AVS.  Nurse Notes: Patient is  requesting a refill on Cetrizine

## 2022-12-30 NOTE — Patient Instructions (Signed)
Stacey [name] , Thank you for taking time to come for your Medicare Wellness Visit. I appreciate your ongoing commitment to your health goals. Please review the following plan we discussed and let me know if I can assist you in the future.   These are the goals we discussed:  Goals      Prevent falls        This is a list of the screening recommended for you and due dates:  Health Maintenance  Topic Date Due   DTaP/Tdap/Td vaccine (1 - Tdap) Never done   DEXA scan (bone density measurement)  Never done   COVID-19 Vaccine (4 - 2023-24 season) 06/07/2022   Zoster (Shingles) Vaccine (1 of 2) 02/03/2023*   Medicare Annual Wellness Visit  12/30/2023   Pneumonia Vaccine  Completed   Flu Shot  Completed   HPV Vaccine  Aged Out  *Topic was postponed. The date shown is not the original due date.    Advanced directives: Please bring a copy of your health care power of attorney and living will to the office to be added to your chart at your convenience.   Conditions/risks identified: Aim for 30 minutes of exercise or brisk walking, 6-8 glasses of water, and 5 servings of fruits and vegetables each day.  Next appointment: Follow up in one year for your annual wellness visit    Preventive Care 65 Years and Older, Female Preventive care refers to lifestyle choices and visits with your health care provider that can promote health and wellness. What does preventive care include? A yearly physical exam. This is also called an annual well check. Dental exams once or twice a year. Routine eye exams. Ask your health care provider how often you should have your eyes checked. Personal lifestyle choices, including: Daily care of your teeth and gums. Regular physical activity. Eating a healthy diet. Avoiding tobacco and drug use. Limiting alcohol use. Practicing safe sex. Taking low-dose aspirin every day. Taking vitamin and mineral supplements as recommended by your health care provider. What  happens during an annual well check? The services and screenings done by your health care provider during your annual well check will depend on your age, overall health, lifestyle risk factors, and family history of disease. Counseling  Your health care provider may ask you questions about your: Alcohol use. Tobacco use. Drug use. Emotional well-being. Home and relationship well-being. Sexual activity. Eating habits. History of falls. Memory and ability to understand (cognition). Work and work Statistician. Reproductive health. Screening  You may have the following tests or measurements: Height, weight, and BMI. Blood pressure. Lipid and cholesterol levels. These may be checked every 5 years, or more frequently if you are over 81 years old. Skin check. Lung cancer screening. You may have this screening every year starting at age 81 if you have a 30-pack-year history of smoking and currently smoke or have quit within the past 15 years. Fecal occult blood test (FOBT) of the stool. You may have this test every year starting at age 81. Flexible sigmoidoscopy or colonoscopy. You may have a sigmoidoscopy every 5 years or a colonoscopy every 10 years starting at age 81. Hepatitis C blood test. Hepatitis B blood test. Sexually transmitted disease (STD) testing. Diabetes screening. This is done by checking your blood sugar (glucose) after you have not eaten for a while (fasting). You may have this done every 1-3 years. Bone density scan. This is done to screen for osteoporosis. You may have this done starting  at age 21. Mammogram. This may be done every 1-2 years. Talk to your health care provider about how often you should have regular mammograms. Talk with your health care provider about your test results, treatment options, and if necessary, the need for more tests. Vaccines  Your health care provider may recommend certain vaccines, such as: Influenza vaccine. This is recommended every  year. Tetanus, diphtheria, and acellular pertussis (Tdap, Td) vaccine. You may need a Td booster every 10 years. Zoster vaccine. You may need this after age 81. Pneumococcal 13-valent conjugate (PCV13) vaccine. One dose is recommended after age 81. Pneumococcal polysaccharide (PPSV23) vaccine. One dose is recommended after age 81. Talk to your health care provider about which screenings and vaccines you need and how often you need them. This information is not intended to replace advice given to you by your health care provider. Make sure you discuss any questions you have with your health care provider. Document Released: 10/20/2015 Document Revised: 06/12/2016 Document Reviewed: 07/25/2015 Elsevier Interactive Patient Education  2017 [employer] Prevention in the Home Falls can cause injuries. They can happen to people of all ages. There are many things you can do to make your home safe and to help prevent falls. What can I do on the outside of my home? Regularly fix the edges of walkways and driveways and fix any cracks. Remove anything that might make you trip as you walk through a door, such as a raised step or threshold. Trim any bushes or trees on the path to your home. Use bright outdoor lighting. Clear any walking paths of anything that might make someone trip, such as rocks or tools. Regularly check to see if handrails are loose or broken. Make sure that both sides of any steps have handrails. Any raised decks and porches should have guardrails on the edges. Have any leaves, snow, or ice cleared regularly. Use sand or salt on walking paths during winter. Clean up any spills in your garage right away. This includes oil or grease spills. What can I do in the bathroom? Use night lights. Install grab bars by the toilet and in the tub and shower. Do not use towel bars as grab bars. Use non-skid mats or decals in the tub or shower. If you need to sit down in the shower, use a  plastic, non-slip stool. Keep the floor dry. Clean up any water that spills on the floor as soon as it happens. Remove soap buildup in the tub or shower regularly. Attach bath mats securely with double-sided non-slip rug tape. Do not have throw rugs and other things on the floor that can make you trip. What can I do in the bedroom? Use night lights. Make sure that you have a light by your bed that is easy to reach. Do not use any sheets or blankets that are too big for your bed. They should not hang down onto the floor. Have a firm chair that has side arms. You can use this for support while you get dressed. Do not have throw rugs and other things on the floor that can make you trip. What can I do in the kitchen? Clean up any spills right away. Avoid walking on wet floors. Keep items that you use a lot in easy-to-reach places. If you need to reach something above you, use a strong step stool that has a grab bar. Keep electrical cords out of the way. Do not use floor polish or wax that makes  floors slippery. If you must use wax, use non-skid floor wax. Do not have throw rugs and other things on the floor that can make you trip. What can I do with my stairs? Do not leave any items on the stairs. Make sure that there are handrails on both sides of the stairs and use them. Fix handrails that are broken or loose. Make sure that handrails are as long as the stairways. Check any carpeting to make sure that it is firmly attached to the stairs. Fix any carpet that is loose or worn. Avoid having throw rugs at the top or bottom of the stairs. If you do have throw rugs, attach them to the floor with carpet tape. Make sure that you have a light switch at the top of the stairs and the bottom of the stairs. If you do not have them, ask someone to add them for you. What else can I do to help prevent falls? Wear shoes that: Do not have high heels. Have rubber bottoms. Are comfortable and fit you  well. Are closed at the toe. Do not wear sandals. If you use a stepladder: Make sure that it is fully opened. Do not climb a closed stepladder. Make sure that both sides of the stepladder are locked into place. Ask someone to hold it for you, if possible. Clearly mark and make sure that you can see: Any grab bars or handrails. First and last steps. Where the edge of each step is. Use tools that help you move around (mobility aids) if they are needed. These include: Canes. Walkers. Scooters. Crutches. Turn on the lights when you go into a dark area. Replace any light bulbs as soon as they burn out. Set up your furniture so you have a clear path. Avoid moving your furniture around. If any of your floors are uneven, fix them. If there are any pets around you, be aware of where they are. Review your medicines with your doctor. Some medicines can make you feel dizzy. This can increase your chance of falling. Ask your doctor what other things that you can do to help prevent falls. This information is not intended to replace advice given to you by your health care provider. Make sure you discuss any questions you have with your health care provider. Document Released: 07/20/2009 Document Revised: 02/29/2016 Document Reviewed: 10/28/2014 Elsevier Interactive Patient Education  2017 [employer].

## 2023-02-04 ENCOUNTER — Ambulatory Visit: Payer: Medicare Other | Admitting: Family

## 2023-02-28 ENCOUNTER — Ambulatory Visit (INDEPENDENT_AMBULATORY_CARE_PROVIDER_SITE_OTHER): Payer: Medicare Other | Admitting: Family

## 2023-02-28 ENCOUNTER — Encounter: Payer: Self-pay | Admitting: Family

## 2023-02-28 VITALS — BP 129/77 | HR 88 | Temp 98.0°F | Ht 63.0 in | Wt 170.4 lb

## 2023-02-28 DIAGNOSIS — E785 Hyperlipidemia, unspecified: Secondary | ICD-10-CM | POA: Diagnosis not present

## 2023-02-28 DIAGNOSIS — K2100 Gastro-esophageal reflux disease with esophagitis, without bleeding: Secondary | ICD-10-CM

## 2023-02-28 DIAGNOSIS — R600 Localized edema: Secondary | ICD-10-CM | POA: Diagnosis not present

## 2023-02-28 DIAGNOSIS — E559 Vitamin D deficiency, unspecified: Secondary | ICD-10-CM

## 2023-02-28 DIAGNOSIS — F411 Generalized anxiety disorder: Secondary | ICD-10-CM | POA: Diagnosis not present

## 2023-02-28 DIAGNOSIS — I10 Essential (primary) hypertension: Secondary | ICD-10-CM | POA: Diagnosis not present

## 2023-02-28 DIAGNOSIS — M159 Polyosteoarthritis, unspecified: Secondary | ICD-10-CM

## 2023-02-28 DIAGNOSIS — R7303 Prediabetes: Secondary | ICD-10-CM

## 2023-02-28 DIAGNOSIS — E669 Obesity, unspecified: Secondary | ICD-10-CM

## 2023-02-28 LAB — BAYER DCA HB A1C WAIVED: HB A1C (BAYER DCA - WAIVED): 6.2 % — ABNORMAL HIGH (ref 4.8–5.6)

## 2023-02-28 MED ORDER — ATORVASTATIN CALCIUM 20 MG PO TABS: 20.0000 mg | ORAL_TABLET | Freq: Every day | ORAL | 3 refills | Status: DC

## 2023-02-28 MED ORDER — DICLOFENAC SODIUM 75 MG PO TBEC: 75.0000 mg | DELAYED_RELEASE_TABLET | Freq: Two times a day (BID) | ORAL | 2 refills | Status: DC

## 2023-02-28 MED ORDER — HYDROCHLOROTHIAZIDE 25 MG PO TABS: 25.0000 mg | ORAL_TABLET | Freq: Every day | ORAL | 0 refills | Status: DC

## 2023-02-28 NOTE — Progress Notes (Signed)
Subjective:    Patient ID: Stacey [name], female    DOB: [birth year], 82 y.o.   MRN: [id number]  Chief Complaint  Patient presents with   Medical Management of Chronic Issues   Pt presents to the office today for chronic follow up. She states she her daughter-in-law passed away 20-Nov-2021.  Hypertension This is a chronic problem. The current episode started more than 1 year ago. The problem has been resolved since onset. The problem is controlled. Associated symptoms include anxiety and peripheral edema. Pertinent negatives include no blurred vision, malaise/fatigue or shortness of breath. Risk factors for coronary artery disease include dyslipidemia and obesity. The current treatment provides moderate improvement.  Gastroesophageal Reflux She complains of belching and heartburn. This is a chronic problem. The current episode started more than 1 year ago. The problem occurs occasionally. Risk factors include obesity. She has tried a PPI for the symptoms. The treatment provided moderate relief.  Arthritis Presents for follow-up visit. She complains of pain and stiffness. Affected locations include the right MCP, left MCP, right knee and left knee. Her pain is at a severity of 8/10.  Diabetes She presents for her follow-up diabetic visit. Diabetes type: prediabetes. Hypoglycemia symptoms include nervousness/anxiousness. Pertinent negatives for diabetes include no blurred vision and no foot paresthesias. (Does not check BS at home ) Eye exam is not current.  Hyperlipidemia This is a chronic problem. The current episode started more than 1 year ago. The problem is controlled. Recent lipid tests were reviewed and are normal. Exacerbating diseases include obesity. Pertinent negatives include no shortness of breath. Current antihyperlipidemic treatment includes statins. The current treatment provides moderate improvement of lipids. Risk factors for coronary artery disease include dyslipidemia,  hypertension, a sedentary lifestyle and post-menopausal.  Anxiety Presents for follow-up visit. Symptoms include excessive worry, nervous/anxious behavior and restlessness. Patient reports no shortness of breath. Symptoms occur occasionally. The severity of symptoms is moderate.        Review of Systems  Constitutional:  Negative for malaise/fatigue.  Eyes:  Negative for blurred vision.  Respiratory:  Negative for shortness of breath.   Gastrointestinal:  Positive for heartburn.  Musculoskeletal:  Positive for arthritis and stiffness.  Psychiatric/Behavioral:  The patient is nervous/anxious.   All other systems reviewed and are negative.      Objective:   Physical Exam Vitals reviewed.  Constitutional:      General: She is not in acute distress.    Appearance: She is well-developed. She is obese.  HENT:     Head: Normocephalic and atraumatic.     Right Ear: Tympanic membrane normal.     Left Ear: Tympanic membrane normal.  Eyes:     Pupils: Pupils are equal, round, and reactive to light.  Neck:     Thyroid: No thyromegaly.  Cardiovascular:     Rate and Rhythm: Normal rate and regular rhythm.     Heart sounds: Normal heart sounds. No murmur heard. Pulmonary:     Effort: Pulmonary effort is normal. No respiratory distress.     Breath sounds: Normal breath sounds. No wheezing.  Abdominal:     General: Bowel sounds are normal. There is no distension.     Palpations: Abdomen is soft.     Tenderness: There is no abdominal tenderness.  Musculoskeletal:        General: No tenderness. Normal range of motion.     Cervical back: Normal range of motion and neck supple.     Right lower leg: Edema (  2+) present.     Left lower leg: Edema (2+) present.  Skin:    General: Skin is warm and dry.  Neurological:     Mental Status: She is alert and oriented to person, place, and time.     Cranial Nerves: No cranial nerve deficit.     Deep Tendon Reflexes: Reflexes are normal and  symmetric.  Psychiatric:        Behavior: Behavior normal.        Thought Content: Thought content normal.        Judgment: Judgment normal.       BP 129/77   Pulse 88   Temp 98 F (36.7 C) (Temporal)   Ht 5\' 3"  (1.6 m)   Wt 170 lb 6.4 oz (77.3 kg)   SpO2 96%   BMI 30.19 kg/m      Assessment & Plan:  Stacey [name] comes in today with chief complaint of Medical Management of Chronic Issues   Diagnosis and orders addressed:  1. Hyperlipidemia, unspecified hyperlipidemia type - atorvastatin (LIPITOR) 20 MG tablet; Take 1 tablet (20 mg total) by mouth daily.  Dispense: 90 tablet; Refill: 3 - CMP14+EGFR - CBC with Differential/Platelet  2. Primary osteoarthritis involving multiple joints - diclofenac (VOLTAREN) 75 MG EC tablet; Take 1 tablet (75 mg total) by mouth 2 (two) times daily.  Dispense: 180 tablet; Refill: 2 - CMP14+EGFR - CBC with Differential/Platelet  3. Primary hypertension - hydrochlorothiazide (HYDRODIURIL) 25 MG tablet; Take 1 tablet (25 mg total) by mouth daily.  Dispense: 90 tablet; Refill: 0 - CMP14+EGFR - CBC with Differential/Platelet  4. GAD (generalized anxiety disorder) - CMP14+EGFR - CBC with Differential/Platelet  5. Gastroesophageal reflux disease with esophagitis, unspecified whether hemorrhage - CMP14+EGFR - CBC with Differential/Platelet  6. Obesity (BMI 30-39.9) - CMP14+EGFR - CBC with Differential/Platelet  7. Peripheral edema Compression hose  Low salt diet  Keep elevated when possible  - CMP14+EGFR - CBC with Differential/Platelet  8. Vitamin D deficiency - CMP14+EGFR - CBC with Differential/Platelet  9. Prediabetes - CMP14+EGFR - CBC with Differential/Platelet - Bayer DCA Hb A1c Waived   Labs pending Health Maintenance reviewed Diet and exercise encouraged  Follow up plan: 4 months    Jannifer Rodney, FNP

## 2023-02-28 NOTE — Patient Instructions (Signed)
Peripheral Edema  Peripheral edema is swelling that is caused by a buildup of fluid. Peripheral edema most often affects the lower legs, ankles, and feet. It can also develop in the arms, hands, and face. The area of the body that has peripheral edema will look swollen. It may also feel heavy or warm. Your clothes may start to feel tight. Pressing on the area may make a temporary dent in your skin (pitting edema). You may not be able to move your swollen arm or leg as much as usual. There are many causes of peripheral edema. It can happen because of a complication of other conditions such as heart failure, kidney disease, or a problem with your circulation. It also can be a side effect of certain medicines or happen because of an infection. It often happens to women during pregnancy. Sometimes, the cause is not known. Follow these instructions at home: Managing pain, stiffness, and swelling  Raise (elevate) your legs while you are sitting or lying down. Move around often to prevent stiffness and to reduce swelling. Do not sit or stand for long periods of time. Do not wear tight clothing. Do not wear garters on your upper legs. Exercise your legs to get your circulation going. This helps to move the fluid back into your blood vessels, and it may help the swelling go down. Wear compression stockings as told by your health care provider. These stockings help to prevent blood clots and reduce swelling in your legs. It is important that these are the correct size. These stockings should be prescribed by your doctor to prevent possible injuries. If elastic bandages or wraps are recommended, use them as told by your health care provider. Medicines Take over-the-counter and prescription medicines only as told by your health care provider. Your health care provider may prescribe medicine to help your body get rid of excess water (diuretic). Take this medicine if you are told to take it. General  instructions Eat a low-salt (low-sodium) diet as told by your health care provider. Sometimes, eating less salt may reduce swelling. Pay attention to any changes in your symptoms. Moisturize your skin daily to help prevent skin from cracking and draining. Keep all follow-up visits. This is important. Contact a health care provider if: You have a fever. You have swelling in only one leg. You have increased swelling, redness, or pain in one or both of your legs. You have drainage or sores at the area where you have edema. Get help right away if: You have edema that starts suddenly or is getting worse, especially if you are pregnant or have a medical condition. You develop shortness of breath, especially when you are lying down. You have pain in your chest or abdomen. You feel weak. You feel like you will faint. These symptoms may be an emergency. Get help right away. Call 911. Do not wait to see if the symptoms will go away. Do not drive yourself to the hospital. Summary Peripheral edema is swelling that is caused by a buildup of fluid. Peripheral edema most often affects the lower legs, ankles, and feet. Move around often to prevent stiffness and to reduce swelling. Do not sit or stand for long periods of time. Pay attention to any changes in your symptoms. Contact a health care provider if you have edema that starts suddenly or is getting worse, especially if you are pregnant or have a medical condition. Get help right away if you develop shortness of breath, especially when lying down.  This information is not intended to replace advice given to you by your health care provider. Make sure you discuss any questions you have with your health care provider. Document Revised: 05/28/2021 Document Reviewed: 05/28/2021 Elsevier Patient Education  2024 [employer].

## 2023-03-01 LAB — CBC WITH DIFFERENTIAL/PLATELET
Basophils Absolute: 0.1 10*3/uL (ref 0.0–0.2)
Basos: 1 %
EOS (ABSOLUTE): 0.1 10*3/uL (ref 0.0–0.4)
Eos: 1 %
Hematocrit: 36.8 % (ref 34.0–46.6)
Hemoglobin: 11.9 g/dL (ref 11.1–15.9)
Immature Grans (Abs): 0 10*3/uL (ref 0.0–0.1)
Immature Granulocytes: 0 %
Lymphocytes Absolute: 1.8 10*3/uL (ref 0.7–3.1)
Lymphs: 23 %
MCH: 29.5 pg (ref 26.6–33.0)
MCHC: 32.3 g/dL (ref 31.5–35.7)
MCV: 91 fL (ref 79–97)
Monocytes Absolute: 0.4 10*3/uL (ref 0.1–0.9)
Monocytes: 6 %
Neutrophils Absolute: 5.4 10*3/uL (ref 1.4–7.0)
Neutrophils: 69 %
Platelets: 215 10*3/uL (ref 150–450)
RBC: 4.03 x10E6/uL (ref 3.77–5.28)
RDW: 12.4 % (ref 11.7–15.4)
WBC: 7.8 10*3/uL (ref 3.4–10.8)

## 2023-03-01 LAB — CMP14+EGFR
ALT: 14 IU/L (ref 0–32)
AST: 20 IU/L (ref 0–40)
Albumin/Globulin Ratio: 1.6 (ref 1.2–2.2)
Albumin: 4.2 g/dL (ref 3.8–4.8)
Alkaline Phosphatase: 65 IU/L (ref 44–121)
BUN/Creatinine Ratio: 21 (ref 12–28)
BUN: 18 mg/dL (ref 8–27)
Bilirubin Total: <0.2 mg/dL (ref 0.0–1.2)
CO2: 26 mmol/L (ref 20–29)
Calcium: 10 mg/dL (ref 8.7–10.3)
Chloride: 99 mmol/L (ref 96–106)
Creatinine, Ser: 0.85 mg/dL (ref 0.57–1.00)
Globulin, Total: 2.6 g/dL (ref 1.5–4.5)
Glucose: 115 mg/dL — ABNORMAL HIGH (ref 70–99)
Potassium: 3.9 mmol/L (ref 3.5–5.2)
Sodium: 140 mmol/L (ref 134–144)
Total Protein: 6.8 g/dL (ref 6.0–8.5)
eGFR: 69 mL/min/{1.73_m2} (ref >59)

## 2023-05-22 ENCOUNTER — Ambulatory Visit: Payer: Medicare Other | Admitting: Family

## 2023-05-22 ENCOUNTER — Encounter: Payer: Self-pay | Admitting: Family

## 2023-05-22 VITALS — BP 134/78 | HR 93 | Temp 97.8°F | Ht 63.0 in | Wt 168.8 lb

## 2023-05-22 DIAGNOSIS — F411 Generalized anxiety disorder: Secondary | ICD-10-CM

## 2023-05-22 DIAGNOSIS — R7303 Prediabetes: Secondary | ICD-10-CM

## 2023-05-22 DIAGNOSIS — J0110 Acute frontal sinusitis, unspecified: Secondary | ICD-10-CM

## 2023-05-22 DIAGNOSIS — J019 Acute sinusitis, unspecified: Secondary | ICD-10-CM | POA: Diagnosis not present

## 2023-05-22 DIAGNOSIS — K2100 Gastro-esophageal reflux disease with esophagitis, without bleeding: Secondary | ICD-10-CM | POA: Diagnosis not present

## 2023-05-22 DIAGNOSIS — M159 Polyosteoarthritis, unspecified: Secondary | ICD-10-CM

## 2023-05-22 DIAGNOSIS — J301 Allergic rhinitis due to pollen: Secondary | ICD-10-CM | POA: Diagnosis not present

## 2023-05-22 DIAGNOSIS — I10 Essential (primary) hypertension: Secondary | ICD-10-CM

## 2023-05-22 DIAGNOSIS — E663 Overweight: Secondary | ICD-10-CM

## 2023-05-22 DIAGNOSIS — E559 Vitamin D deficiency, unspecified: Secondary | ICD-10-CM

## 2023-05-22 DIAGNOSIS — R251 Tremor, unspecified: Secondary | ICD-10-CM

## 2023-05-22 LAB — BAYER DCA HB A1C WAIVED: HB A1C (BAYER DCA - WAIVED): 6 % — ABNORMAL HIGH (ref 4.8–5.6)

## 2023-05-22 MED ORDER — FLUCONAZOLE 150 MG PO TABS: 150.0000 mg | ORAL_TABLET | Freq: Once | ORAL | 0 refills | Status: AC

## 2023-05-22 MED ORDER — AMOXICILLIN-POT CLAVULANATE 875-125 MG PO TABS: 1.0000 | ORAL_TABLET | Freq: Two times a day (BID) | ORAL | 0 refills | Status: DC

## 2023-05-22 MED ORDER — BUSPIRONE HCL 5 MG PO TABS: 5.0000 mg | ORAL_TABLET | Freq: Three times a day (TID) | ORAL | 1 refills | Status: DC | PRN

## 2023-05-22 MED ORDER — FLUTICASONE PROPIONATE 50 MCG/ACT NA SUSP: 2.0000 | Freq: Every day | NASAL | 6 refills | Status: DC

## 2023-05-22 NOTE — Progress Notes (Signed)
Subjective:    Patient ID: Stacey [name], female    DOB: [birth year], 82 y.o.   MRN: [id number]  Chief Complaint  Patient presents with   shakey   Sinus Problem   Pt presents to the office today for chronic follow up. She states she her daughter-in-law passed away 12/04/2021.   She is complaining of shakiness that started three days ago. Unsure if this is related to her prediabetes or anxiety. She does admit to feeling stressed.  Sinus Problem This is a new problem. The current episode started 1 to 4 weeks ago. The problem has been gradually worsening since onset. There has been no fever. Her pain is at a severity of 10/10. The pain is mild. Associated symptoms include congestion, headaches, shortness of breath and sinus pressure. Pertinent negatives include no coughing, ear pain or sore throat. Past treatments include acetaminophen. The treatment provided mild relief.  Hypertension This is a chronic problem. The current episode started more than 1 year ago. The problem has been resolved since onset. The problem is controlled. Associated symptoms include anxiety, headaches, malaise/fatigue and shortness of breath. Pertinent negatives include no blurred vision or peripheral edema. Risk factors for coronary artery disease include dyslipidemia, obesity and sedentary lifestyle. The current treatment provides moderate improvement.  Gastroesophageal Reflux She complains of belching and heartburn. She reports no coughing or no sore throat. This is a chronic problem. The current episode started more than 1 year ago. The problem has been waxing and waning. She has tried a PPI for the symptoms. The treatment provided moderate relief.  Arthritis Presents for follow-up visit. She complains of pain and stiffness. Affected locations include the left knee and right knee. Her pain is at a severity of 10/10.  Diabetes She presents for her follow-up diabetic visit. Diabetes type: prediabetes. Hypoglycemia  symptoms include headaches and nervousness/anxiousness. Pertinent negatives for hypoglycemia include no dizziness. Pertinent negatives for diabetes include no blurred vision and no foot paresthesias. Symptoms are stable. Risk factors for coronary artery disease include diabetes mellitus, hypertension, sedentary lifestyle and post-menopausal. She is following a generally healthy diet. (Does not check glucose at home)  Hyperlipidemia This is a chronic problem. The current episode started more than 1 year ago. The problem is controlled. Recent lipid tests were reviewed and are normal. Exacerbating diseases include obesity. Associated symptoms include shortness of breath. Current antihyperlipidemic treatment includes statins. The current treatment provides moderate improvement of lipids. Risk factors for coronary artery disease include dyslipidemia, diabetes mellitus, hypertension, a sedentary lifestyle and post-menopausal.  Anxiety Presents for follow-up visit. Symptoms include depressed mood, excessive worry, nervous/anxious behavior and shortness of breath. Patient reports no dizziness. Symptoms occur most days. The severity of symptoms is moderate.        Review of Systems  Constitutional:  Positive for malaise/fatigue.  HENT:  Positive for congestion and sinus pressure. Negative for ear pain and sore throat.   Eyes:  Negative for blurred vision.  Respiratory:  Positive for shortness of breath. Negative for cough.   Gastrointestinal:  Positive for heartburn.  Musculoskeletal:  Positive for arthritis and stiffness.  Neurological:  Positive for headaches. Negative for dizziness.  Psychiatric/Behavioral:  The patient is nervous/anxious.   All other systems reviewed and are negative.      Objective:   Physical Exam Vitals reviewed.  Constitutional:      General: She is not in acute distress.    Appearance: She is well-developed. She is obese.  HENT:  Head: Normocephalic and atraumatic.      Right Ear: Tympanic membrane normal.     Left Ear: Tympanic membrane normal.  Eyes:     Pupils: Pupils are equal, round, and reactive to light.  Neck:     Thyroid: No thyromegaly.  Cardiovascular:     Rate and Rhythm: Normal rate and regular rhythm.     Heart sounds: Normal heart sounds. No murmur heard. Pulmonary:     Effort: Pulmonary effort is normal. No respiratory distress.     Breath sounds: Normal breath sounds. No wheezing.  Abdominal:     General: Bowel sounds are normal. There is no distension.     Palpations: Abdomen is soft.     Tenderness: There is no abdominal tenderness.  Musculoskeletal:        General: No tenderness. Normal range of motion.     Cervical back: Normal range of motion and neck supple.     Right lower leg: Edema (trace) present.     Left lower leg: Edema (trace) present.  Skin:    General: Skin is warm and dry.  Neurological:     Mental Status: She is alert and oriented to person, place, and time.     Cranial Nerves: No cranial nerve deficit.     Deep Tendon Reflexes: Reflexes are normal and symmetric.  Psychiatric:        Mood and Affect: Mood is anxious.        Behavior: Behavior normal.        Thought Content: Thought content normal.        Judgment: Judgment normal.     Comments: shaky          BP 134/78   Pulse 93   Temp 97.8 F (36.6 C) (Temporal)   Ht 5\' 3"  (1.6 m)   Wt 168 lb 12.8 oz (76.6 kg)   SpO2 96%   BMI 29.90 kg/m   Assessment & Plan:  Stacey [name] comes in today with chief complaint of shakey and Sinus Problem   Diagnosis and orders addressed:  1. GAD (generalized anxiety disorder) Start Buspar 5 mg TID prn  Stress management  - CBC with Differential/Platelet - CMP14+EGFR - busPIRone (BUSPAR) 5 MG tablet; Take 1 tablet (5 mg total) by mouth 3 (three) times daily as needed.  Dispense: 90 tablet; Refill: 1  2. Gastroesophageal reflux disease with esophagitis, unspecified whether hemorrhage - CBC with  Differential/Platelet - CMP14+EGFR  3. Primary hypertension - CBC with Differential/Platelet - CMP14+EGFR  4. Vitamin D deficiency - CBC with Differential/Platelet - CMP14+EGFR - VITAMIN D 25 Hydroxy (Vit-D Deficiency, Fractures)  5. Prediabetes - Bayer DCA Hb A1c Waived - CBC with Differential/Platelet - CMP14+EGFR  6. Overweight (BMI 25.0-29.9) - CBC with Differential/Platelet - CMP14+EGFR  7. Primary osteoarthritis involving multiple joints - CBC with Differential/Platelet - CMP14+EGFR  8. Acute non-recurrent frontal sinusitis - Take meds as prescribed - Use a cool mist humidifier  -Use saline nose sprays frequently -Force fluids -For any cough or congestion  Use plain Mucinex- regular strength or max strength is fine -For fever or aces or pains- take tylenol or ibuprofen. -Throat lozenges if help -Start Augmentin  - amoxicillin-clavulanate (AUGMENTIN) 875-125 MG tablet; Take 1 tablet by mouth 2 (two) times daily.  Dispense: 14 tablet; Refill: 0 - CBC with Differential/Platelet - CMP14+EGFR  9. Shakiness - CBC with Differential/Platelet - CMP14+EGFR - TSH - busPIRone (BUSPAR) 5 MG tablet; Take 1 tablet (5 mg total) by mouth  3 (three) times daily as needed.  Dispense: 90 tablet; Refill: 1  10. Acute sinusitis, recurrence not specified, unspecified location - fluticasone (FLONASE) 50 MCG/ACT nasal spray; Place 2 sprays into both nostrils daily.  Dispense: 16 g; Refill: 6  11. Allergic rhinitis due to pollen, unspecified seasonality - fluticasone (FLONASE) 50 MCG/ACT nasal spray; Place 2 sprays into both nostrils daily.  Dispense: 16 g; Refill: 6   Labs pending Start Buspar 5 mg TID prn  Stress management  Start Augmentin  Health Maintenance reviewed Diet and exercise encouraged  Follow up plan: 2-4 weeks to recheck GAD and shakiness   Jannifer Rodney, FNP

## 2023-05-22 NOTE — Patient Instructions (Signed)

## 2023-05-22 NOTE — Addendum Note (Signed)
Addended by: Austin Miles F on: 05/22/2023 11:14 AM   Modules accepted: Orders

## 2023-05-23 LAB — CMP14+EGFR
ALT: 12 IU/L (ref 0–32)
AST: 20 IU/L (ref 0–40)
Albumin: 4.4 g/dL (ref 3.8–4.8)
Alkaline Phosphatase: 66 IU/L (ref 44–121)
BUN/Creatinine Ratio: 14 (ref 12–28)
BUN: 13 mg/dL (ref 8–27)
Bilirubin Total: 0.3 mg/dL (ref 0.0–1.2)
CO2: 24 mmol/L (ref 20–29)
Calcium: 10.2 mg/dL (ref 8.7–10.3)
Chloride: 98 mmol/L (ref 96–106)
Creatinine, Ser: 0.94 mg/dL (ref 0.57–1.00)
Globulin, Total: 2.6 g/dL (ref 1.5–4.5)
Glucose: 120 mg/dL — ABNORMAL HIGH (ref 70–99)
Potassium: 3.9 mmol/L (ref 3.5–5.2)
Sodium: 139 mmol/L (ref 134–144)
Total Protein: 7 g/dL (ref 6.0–8.5)
eGFR: 61 mL/min/{1.73_m2} (ref >59)

## 2023-05-23 LAB — CBC WITH DIFFERENTIAL/PLATELET
Basophils Absolute: 0 10*3/uL (ref 0.0–0.2)
Basos: 0 %
EOS (ABSOLUTE): 0.1 10*3/uL (ref 0.0–0.4)
Eos: 1 %
Hematocrit: 36.3 % (ref 34.0–46.6)
Hemoglobin: 12.1 g/dL (ref 11.1–15.9)
Immature Grans (Abs): 0 10*3/uL (ref 0.0–0.1)
Immature Granulocytes: 0 %
Lymphocytes Absolute: 2 10*3/uL (ref 0.7–3.1)
Lymphs: 26 %
MCH: 30.7 pg (ref 26.6–33.0)
MCHC: 33.3 g/dL (ref 31.5–35.7)
MCV: 92 fL (ref 79–97)
Monocytes Absolute: 0.5 10*3/uL (ref 0.1–0.9)
Monocytes: 6 %
Neutrophils Absolute: 5 10*3/uL (ref 1.4–7.0)
Neutrophils: 67 %
Platelets: 200 10*3/uL (ref 150–450)
RBC: 3.94 x10E6/uL (ref 3.77–5.28)
RDW: 12.5 % (ref 11.7–15.4)
WBC: 7.6 10*3/uL (ref 3.4–10.8)

## 2023-05-23 LAB — TSH: TSH: 1.74 u[IU]/mL (ref 0.450–4.500)

## 2023-05-23 LAB — VITAMIN D 25 HYDROXY (VIT D DEFICIENCY, FRACTURES): Vit D, 25-Hydroxy: 38.8 ng/mL (ref 30.0–100.0)

## 2023-05-27 ENCOUNTER — Other Ambulatory Visit: Payer: Self-pay | Admitting: Family

## 2023-05-27 DIAGNOSIS — I10 Essential (primary) hypertension: Secondary | ICD-10-CM

## 2023-07-01 ENCOUNTER — Ambulatory Visit (INDEPENDENT_AMBULATORY_CARE_PROVIDER_SITE_OTHER): Payer: Medicare Other | Admitting: Family

## 2023-07-01 ENCOUNTER — Encounter: Payer: Self-pay | Admitting: Family

## 2023-07-01 VITALS — BP 122/71 | HR 76 | Temp 97.2°F | Ht 63.0 in | Wt 171.4 lb

## 2023-07-01 DIAGNOSIS — I10 Essential (primary) hypertension: Secondary | ICD-10-CM

## 2023-07-01 DIAGNOSIS — K2100 Gastro-esophageal reflux disease with esophagitis, without bleeding: Secondary | ICD-10-CM

## 2023-07-01 DIAGNOSIS — J301 Allergic rhinitis due to pollen: Secondary | ICD-10-CM

## 2023-07-01 DIAGNOSIS — M159 Polyosteoarthritis, unspecified: Secondary | ICD-10-CM

## 2023-07-01 DIAGNOSIS — E785 Hyperlipidemia, unspecified: Secondary | ICD-10-CM

## 2023-07-01 DIAGNOSIS — F411 Generalized anxiety disorder: Secondary | ICD-10-CM | POA: Diagnosis not present

## 2023-07-01 DIAGNOSIS — R7303 Prediabetes: Secondary | ICD-10-CM

## 2023-07-01 DIAGNOSIS — G8929 Other chronic pain: Secondary | ICD-10-CM

## 2023-07-01 DIAGNOSIS — M5441 Lumbago with sciatica, right side: Secondary | ICD-10-CM

## 2023-07-01 MED ORDER — CETIRIZINE HCL 10 MG PO TABS
10.0000 mg | ORAL_TABLET | Freq: Every day | ORAL | 1 refills | Status: DC
Start: 2023-07-01 — End: 2024-01-07

## 2023-07-01 NOTE — Progress Notes (Signed)
Subjective:    Patient ID: Stacey [name], female    DOB: 27-Apr-1941, 82 y.o.   MRN: [id number]  Chief Complaint  Patient presents with   Medical Management of Chronic Issues    Will take the Flu shot in October     Pt presents to the office today for chronic follow up. She states she her daughter-in-law passed away 12/05/2021.  Hypertension This is a chronic problem. The current episode started more than 1 year ago. The problem has been resolved since onset. The problem is controlled. Associated symptoms include anxiety and peripheral edema. Pertinent negatives include no blurred vision, malaise/fatigue or shortness of breath. Risk factors for coronary artery disease include dyslipidemia and obesity. The current treatment provides moderate improvement.  Gastroesophageal Reflux She complains of belching and heartburn. This is a chronic problem. The current episode started more than 1 year ago. The problem occurs occasionally. She has tried a PPI for the symptoms. The treatment provided moderate relief.  Arthritis Presents for follow-up visit. She complains of pain and stiffness. The symptoms have been stable. Affected locations include the left knee, right knee, left MCP and right MCP. Her pain is at a severity of 10/10 (at times).  Hyperlipidemia This is a chronic problem. The current episode started more than 1 year ago. The problem is controlled. Recent lipid tests were reviewed and are normal. Exacerbating diseases include obesity. Pertinent negatives include no shortness of breath. Current antihyperlipidemic treatment includes statins. The current treatment provides moderate improvement of lipids. Risk factors for coronary artery disease include dyslipidemia, hypertension, a sedentary lifestyle and post-menopausal.  Back Pain This is a chronic problem. The current episode started more than 1 year ago. The problem occurs intermittently. The problem has been waxing and waning since onset. The  pain is present in the lumbar spine. The quality of the pain is described as aching. The pain is at a severity of 8/10. The pain is moderate. The treatment provided mild relief.  Anxiety Presents for follow-up visit. Symptoms include excessive worry, nervous/anxious behavior and restlessness. Patient reports no shortness of breath. Symptoms occur occasionally. The severity of symptoms is mild.    Diabetes Diabetes type: prediabetes. Hypoglycemia symptoms include nervousness/anxiousness. Pertinent negatives for diabetes include no blurred vision and no foot paresthesias. (Does not check glucose at home)      Review of Systems  Constitutional:  Negative for malaise/fatigue.  Eyes:  Negative for blurred vision.  Respiratory:  Negative for shortness of breath.   Gastrointestinal:  Positive for heartburn.  Musculoskeletal:  Positive for arthritis, back pain and stiffness.  Psychiatric/Behavioral:  The patient is nervous/anxious.   All other systems reviewed and are negative.      Objective:   Physical Exam Vitals reviewed.  Constitutional:      General: She is not in acute distress.    Appearance: She is well-developed. She is obese.  HENT:     Head: Normocephalic and atraumatic.     Right Ear: Tympanic membrane normal.     Left Ear: Tympanic membrane normal.  Eyes:     Pupils: Pupils are equal, round, and reactive to light.  Neck:     Thyroid: No thyromegaly.  Cardiovascular:     Rate and Rhythm: Normal rate and regular rhythm.     Heart sounds: Normal heart sounds. No murmur heard. Pulmonary:     Effort: Pulmonary effort is normal. No respiratory distress.     Breath sounds: Normal breath sounds. No wheezing.  Abdominal:  General: Bowel sounds are normal. There is no distension.     Palpations: Abdomen is soft.     Tenderness: There is no abdominal tenderness.  Musculoskeletal:        General: No tenderness. Normal range of motion.     Cervical back: Normal range of  motion and neck supple.     Right lower leg: Edema (2+) present.     Left lower leg: Edema (2+) present.  Skin:    General: Skin is warm and dry.  Neurological:     Mental Status: She is alert and oriented to person, place, and time.     Cranial Nerves: No cranial nerve deficit.     Deep Tendon Reflexes: Reflexes are normal and symmetric.  Psychiatric:        Behavior: Behavior normal.        Thought Content: Thought content normal.        Judgment: Judgment normal.       BP 122/71   Pulse 76   Temp (!) 97.2 F (36.2 C) (Temporal)   Ht 5\' 3"  (1.6 m)   Wt 171 lb 6.4 oz (77.7 kg)   SpO2 96%   BMI 30.36 kg/m      Assessment & Plan:  MARKETIA [name] comes in today with chief complaint of Medical Management of Chronic Issues (Will take the Flu shot in October )   Diagnosis and orders addressed:  1. Allergic rhinitis due to pollen, unspecified seasonality - cetirizine (ZYRTEC ALLERGY) 10 MG tablet; Take 1 tablet (10 mg total) by mouth daily.  Dispense: 90 tablet; Refill: 1  2. Chronic bilateral low back pain with right-sided sciatica  3. GAD (generalized anxiety disorder)  4. Gastroesophageal reflux disease with esophagitis, unspecified whether hemorrhag  5. Hyperlipidemia, unspecified hyperlipidemia type  6. Primary hypertension  7. Primary osteoarthritis involving multiple joints   8. Prediabetes   Labs reviewed last month Continue current medications  Health Maintenance reviewed Diet and exercise encouraged  Follow up plan: 4 months    Stacey Rodney, FNP

## 2023-07-01 NOTE — Patient Instructions (Signed)
Health Maintenance After Age 81 After age 81, you are at a higher risk for certain long-term diseases and infections as well as injuries from falls. Falls are a major cause of broken bones and head injuries in people who are older than age 81. Getting regular preventive care can help to keep you healthy and well. Preventive care includes getting regular testing and making lifestyle changes as recommended by your health care provider. Talk with your health care provider about: Which screenings and tests you should have. A screening is a test that checks for a disease when you have no symptoms. A diet and exercise plan that is right for you. What should I know about screenings and tests to prevent falls? Screening and testing are the best ways to find a health problem early. Early diagnosis and treatment give you the best chance of managing medical conditions that are common after age 81. Certain conditions and lifestyle choices may make you more likely to have a fall. Your health care provider may recommend: Regular vision checks. Poor vision and conditions such as cataracts can make you more likely to have a fall. If you wear glasses, make sure to get your prescription updated if your vision changes. Medicine review. Work with your health care provider to regularly review all of the medicines you are taking, including over-the-counter medicines. Ask your health care provider about any side effects that may make you more likely to have a fall. Tell your health care provider if any medicines that you take make you feel dizzy or sleepy. Strength and balance checks. Your health care provider may recommend certain tests to check your strength and balance while standing, walking, or changing positions. Foot health exam. Foot pain and numbness, as well as not wearing proper footwear, can make you more likely to have a fall. Screenings, including: Osteoporosis screening. Osteoporosis is a condition that causes  the bones to get weaker and break more easily. Blood pressure screening. Blood pressure changes and medicines to control blood pressure can make you feel dizzy. Depression screening. You may be more likely to have a fall if you have a fear of falling, feel depressed, or feel unable to do activities that you used to do. Alcohol use screening. Using too much alcohol can affect your balance and may make you more likely to have a fall. Follow these instructions at home: Lifestyle Do not drink alcohol if: Your health care provider tells you not to drink. If you drink alcohol: Limit how much you have to: 0-1 drink a day for women. 0-2 drinks a day for men. Know how much alcohol is in your drink. In the [place] one drink equals one 12 oz bottle of beer (355 mL), one 5 oz glass of wine (148 mL), or one 1 oz glass of hard liquor (44 mL). Do not use any products that contain nicotine or tobacco. These products include cigarettes, chewing tobacco, and vaping devices, such as e-cigarettes. If you need help quitting, ask your health care provider. Activity  Follow a regular exercise program to stay fit. This will help you maintain your balance. Ask your health care provider what types of exercise are appropriate for you. If you need a cane or walker, use it as recommended by your health care provider. Wear supportive shoes that have nonskid soles. Safety  Remove any tripping hazards, such as rugs, cords, and clutter. Install safety equipment such as grab bars in bathrooms and safety rails on stairs. Keep rooms and walkways   well-lit. General instructions Talk with your health care provider about your risks for falling. Tell your health care provider if: You fall. Be sure to tell your health care provider about all falls, even ones that seem minor. You feel dizzy, tiredness (fatigue), or off-balance. Take over-the-counter and prescription medicines only as told by your health care provider. These include  supplements. Eat a healthy diet and maintain a healthy weight. A healthy diet includes low-fat dairy products, low-fat (lean) meats, and fiber from whole grains, beans, and lots of fruits and vegetables. Stay current with your vaccines. Schedule regular health, dental, and eye exams. Summary Having a healthy lifestyle and getting preventive care can help to protect your health and wellness after age 81. Screening and testing are the best way to find a health problem early and help you avoid having a fall. Early diagnosis and treatment give you the best chance for managing medical conditions that are more common for people who are older than age 81. Falls are a major cause of broken bones and head injuries in people who are older than age 81. Take precautions to prevent a fall at home. Work with your health care provider to learn what changes you can make to improve your health and wellness and to prevent falls. This information is not intended to replace advice given to you by your health care provider. Make sure you discuss any questions you have with your health care provider. Document Revised: 02/12/2021 Document Reviewed: 02/12/2021 Elsevier Patient Education  2024 [employer]  

## 2023-08-22 DIAGNOSIS — Z23 Encounter for immunization: Secondary | ICD-10-CM | POA: Diagnosis not present

## 2023-08-25 ENCOUNTER — Other Ambulatory Visit: Payer: Self-pay | Admitting: Family

## 2023-08-25 DIAGNOSIS — I10 Essential (primary) hypertension: Secondary | ICD-10-CM

## 2023-10-02 ENCOUNTER — Encounter (HOSPITAL_COMMUNITY): Payer: Self-pay

## 2023-10-02 ENCOUNTER — Inpatient Hospital Stay (HOSPITAL_COMMUNITY)
Admission: EM | Admit: 2023-10-02 | Discharge: 2023-10-06 | DRG: 389 | Disposition: A | Discharge status: Home / Self Care | Payer: Medicare Other | Attending: Internal Medicine | Admitting: Internal Medicine

## 2023-10-02 ENCOUNTER — Other Ambulatory Visit: Payer: Self-pay

## 2023-10-02 DIAGNOSIS — Z66 Do not resuscitate: Secondary | ICD-10-CM | POA: Diagnosis present

## 2023-10-02 DIAGNOSIS — I10 Essential (primary) hypertension: Secondary | ICD-10-CM | POA: Diagnosis present

## 2023-10-02 DIAGNOSIS — R1084 Generalized abdominal pain: Secondary | ICD-10-CM | POA: Diagnosis not present

## 2023-10-02 DIAGNOSIS — E785 Hyperlipidemia, unspecified: Secondary | ICD-10-CM | POA: Diagnosis present

## 2023-10-02 DIAGNOSIS — Z79899 Other long term (current) drug therapy: Secondary | ICD-10-CM | POA: Diagnosis not present

## 2023-10-02 DIAGNOSIS — D72829 Elevated white blood cell count, unspecified: Secondary | ICD-10-CM | POA: Diagnosis present

## 2023-10-02 DIAGNOSIS — K565 Intestinal adhesions [bands], unspecified as to partial versus complete obstruction: Secondary | ICD-10-CM | POA: Diagnosis not present

## 2023-10-02 DIAGNOSIS — Z8249 Family history of ischemic heart disease and other diseases of the circulatory system: Secondary | ICD-10-CM | POA: Diagnosis not present

## 2023-10-02 DIAGNOSIS — M545 Low back pain, unspecified: Secondary | ICD-10-CM | POA: Diagnosis present

## 2023-10-02 DIAGNOSIS — F17210 Nicotine dependence, cigarettes, uncomplicated: Secondary | ICD-10-CM | POA: Diagnosis present

## 2023-10-02 DIAGNOSIS — K56609 Unspecified intestinal obstruction, unspecified as to partial versus complete obstruction: Principal | ICD-10-CM | POA: Diagnosis present

## 2023-10-02 DIAGNOSIS — N281 Cyst of kidney, acquired: Secondary | ICD-10-CM | POA: Diagnosis not present

## 2023-10-02 DIAGNOSIS — K219 Gastro-esophageal reflux disease without esophagitis: Secondary | ICD-10-CM | POA: Diagnosis present

## 2023-10-02 DIAGNOSIS — R109 Unspecified abdominal pain: Secondary | ICD-10-CM | POA: Diagnosis not present

## 2023-10-02 DIAGNOSIS — E871 Hypo-osmolality and hyponatremia: Secondary | ICD-10-CM | POA: Diagnosis present

## 2023-10-02 DIAGNOSIS — G8929 Other chronic pain: Secondary | ICD-10-CM | POA: Diagnosis present

## 2023-10-02 DIAGNOSIS — E119 Type 2 diabetes mellitus without complications: Secondary | ICD-10-CM | POA: Diagnosis not present

## 2023-10-02 DIAGNOSIS — R7303 Prediabetes: Secondary | ICD-10-CM | POA: Diagnosis present

## 2023-10-02 DIAGNOSIS — F411 Generalized anxiety disorder: Secondary | ICD-10-CM | POA: Diagnosis present

## 2023-10-02 DIAGNOSIS — Z4682 Encounter for fitting and adjustment of non-vascular catheter: Secondary | ICD-10-CM | POA: Diagnosis not present

## 2023-10-02 DIAGNOSIS — K5669 Other partial intestinal obstruction: Secondary | ICD-10-CM | POA: Diagnosis not present

## 2023-10-02 DIAGNOSIS — M170 Bilateral primary osteoarthritis of knee: Secondary | ICD-10-CM | POA: Diagnosis present

## 2023-10-02 DIAGNOSIS — K449 Diaphragmatic hernia without obstruction or gangrene: Secondary | ICD-10-CM | POA: Diagnosis not present

## 2023-10-02 DIAGNOSIS — K5730 Diverticulosis of large intestine without perforation or abscess without bleeding: Secondary | ICD-10-CM | POA: Diagnosis not present

## 2023-10-02 DIAGNOSIS — Z823 Family history of stroke: Secondary | ICD-10-CM

## 2023-10-02 DIAGNOSIS — E876 Hypokalemia: Secondary | ICD-10-CM | POA: Diagnosis present

## 2023-10-02 LAB — CBC
HCT: 38.6 % (ref 36.0–46.0)
Hemoglobin: 12.4 g/dL (ref 12.0–15.0)
MCH: 29.7 pg (ref 26.0–34.0)
MCHC: 32.1 g/dL (ref 30.0–36.0)
MCV: 92.6 fL (ref 80.0–100.0)
Platelets: 199 10*3/uL (ref 150–400)
RBC: 4.17 MIL/uL (ref 3.87–5.11)
RDW: 13.2 % (ref 11.5–15.5)
WBC: 15.6 10*3/uL — ABNORMAL HIGH (ref 4.0–10.5)
nRBC: 0 % (ref 0.0–0.2)

## 2023-10-02 LAB — COMPREHENSIVE METABOLIC PANEL
ALT: 15 U/L (ref 0–44)
AST: 23 U/L (ref 15–41)
Albumin: 3.9 g/dL (ref 3.5–5.0)
Alkaline Phosphatase: 56 U/L (ref 38–126)
Anion gap: 12 (ref 5–15)
BUN: 20 mg/dL (ref 8–23)
CO2: 25 mmol/L (ref 22–32)
Calcium: 9.3 mg/dL (ref 8.9–10.3)
Chloride: 96 mmol/L — ABNORMAL LOW (ref 98–111)
Creatinine, Ser: 0.95 mg/dL (ref 0.44–1.00)
GFR, Estimated: >60 mL/min (ref >60)
Glucose, Bld: 185 mg/dL — ABNORMAL HIGH (ref 70–99)
Potassium: 3.2 mmol/L — ABNORMAL LOW (ref 3.5–5.1)
Sodium: 133 mmol/L — ABNORMAL LOW (ref 135–145)
Total Bilirubin: 0.6 mg/dL (ref <1.2)
Total Protein: 7.2 g/dL (ref 6.5–8.1)

## 2023-10-02 LAB — LIPASE, BLOOD: Lipase: 26 U/L (ref 11–51)

## 2023-10-02 NOTE — ED Triage Notes (Signed)
Complaining of abdominal pain from under her breast all the way to her pubis. Started this morning. She has not been able to pass stool and has vomited once. Can not eat or drink anything. She thinks she has another bowel obstruction.

## 2023-10-03 ENCOUNTER — Observation Stay (HOSPITAL_COMMUNITY): Payer: Medicare Other

## 2023-10-03 ENCOUNTER — Emergency Department (HOSPITAL_COMMUNITY): Payer: Medicare Other

## 2023-10-03 DIAGNOSIS — E871 Hypo-osmolality and hyponatremia: Secondary | ICD-10-CM

## 2023-10-03 DIAGNOSIS — E119 Type 2 diabetes mellitus without complications: Secondary | ICD-10-CM | POA: Diagnosis not present

## 2023-10-03 DIAGNOSIS — N281 Cyst of kidney, acquired: Secondary | ICD-10-CM | POA: Diagnosis not present

## 2023-10-03 DIAGNOSIS — K449 Diaphragmatic hernia without obstruction or gangrene: Secondary | ICD-10-CM | POA: Diagnosis not present

## 2023-10-03 DIAGNOSIS — K5669 Other partial intestinal obstruction: Secondary | ICD-10-CM | POA: Diagnosis not present

## 2023-10-03 DIAGNOSIS — Z4682 Encounter for fitting and adjustment of non-vascular catheter: Secondary | ICD-10-CM | POA: Diagnosis not present

## 2023-10-03 DIAGNOSIS — K56609 Unspecified intestinal obstruction, unspecified as to partial versus complete obstruction: Secondary | ICD-10-CM | POA: Diagnosis present

## 2023-10-03 DIAGNOSIS — R109 Unspecified abdominal pain: Secondary | ICD-10-CM | POA: Diagnosis not present

## 2023-10-03 DIAGNOSIS — K5730 Diverticulosis of large intestine without perforation or abscess without bleeding: Secondary | ICD-10-CM | POA: Diagnosis not present

## 2023-10-03 LAB — CBC WITH DIFFERENTIAL/PLATELET
Abs Immature Granulocytes: 0 10*3/uL (ref 0.00–0.07)
Basophils Absolute: 0 10*3/uL (ref 0.0–0.1)
Basophils Relative: 1 %
Eosinophils Absolute: 0 10*3/uL (ref 0.0–0.5)
Eosinophils Relative: 0 %
HCT: 35.8 % — ABNORMAL LOW (ref 36.0–46.0)
Hemoglobin: 11.7 g/dL — ABNORMAL LOW (ref 12.0–15.0)
Immature Granulocytes: 0 %
Lymphocytes Relative: 12 %
Lymphs Abs: 0.6 10*3/uL — ABNORMAL LOW (ref 0.7–4.0)
MCH: 30.2 pg (ref 26.0–34.0)
MCHC: 32.7 g/dL (ref 30.0–36.0)
MCV: 92.5 fL (ref 80.0–100.0)
Monocytes Absolute: 0.3 10*3/uL (ref 0.1–1.0)
Monocytes Relative: 7 %
Neutro Abs: 4.2 10*3/uL (ref 1.7–7.7)
Neutrophils Relative %: 80 %
Platelets: 195 10*3/uL (ref 150–400)
RBC: 3.87 MIL/uL (ref 3.87–5.11)
RDW: 13.4 % (ref 11.5–15.5)
WBC: 5.2 10*3/uL (ref 4.0–10.5)
nRBC: 0 % (ref 0.0–0.2)

## 2023-10-03 LAB — COMPREHENSIVE METABOLIC PANEL
ALT: 14 U/L (ref 0–44)
AST: 25 U/L (ref 15–41)
Albumin: 3.3 g/dL — ABNORMAL LOW (ref 3.5–5.0)
Alkaline Phosphatase: 49 U/L (ref 38–126)
Anion gap: 10 (ref 5–15)
BUN: 19 mg/dL (ref 8–23)
CO2: 29 mmol/L (ref 22–32)
Calcium: 8.7 mg/dL — ABNORMAL LOW (ref 8.9–10.3)
Chloride: 96 mmol/L — ABNORMAL LOW (ref 98–111)
Creatinine, Ser: 0.9 mg/dL (ref 0.44–1.00)
GFR, Estimated: >60 mL/min (ref >60)
Glucose, Bld: 136 mg/dL — ABNORMAL HIGH (ref 70–99)
Potassium: 3.1 mmol/L — ABNORMAL LOW (ref 3.5–5.1)
Sodium: 135 mmol/L (ref 135–145)
Total Bilirubin: 0.6 mg/dL (ref <1.2)
Total Protein: 6.3 g/dL — ABNORMAL LOW (ref 6.5–8.1)

## 2023-10-03 LAB — TSH: TSH: 0.845 u[IU]/mL (ref 0.350–4.500)

## 2023-10-03 LAB — MAGNESIUM: Magnesium: 1.8 mg/dL (ref 1.7–2.4)

## 2023-10-03 LAB — GLUCOSE, CAPILLARY: Glucose-Capillary: 131 mg/dL — ABNORMAL HIGH (ref 70–99)

## 2023-10-03 MED ORDER — DIATRIZOATE MEGLUMINE & SODIUM 66-10 % PO SOLN
90.0000 mL | Freq: Once | ORAL | Status: AC
  Administered 2023-10-03: 90 mL via NASOGASTRIC
  Filled 2023-10-03: qty 90

## 2023-10-03 MED ORDER — LACTATED RINGERS IV BOLUS
1000.0000 mL | Freq: Once | INTRAVENOUS | Status: AC
  Administered 2023-10-03: 1000 mL via INTRAVENOUS

## 2023-10-03 MED ORDER — METOPROLOL TARTRATE 5 MG/5ML IV SOLN: 5.0000 mg | Freq: Four times a day (QID) | INTRAVENOUS | Status: DC | PRN

## 2023-10-03 MED ORDER — ONDANSETRON HCL 4 MG PO TABS: 4.0000 mg | ORAL_TABLET | Freq: Four times a day (QID) | ORAL | Status: DC | PRN

## 2023-10-03 MED ORDER — ACETAMINOPHEN 325 MG PO TABS
650.0000 mg | ORAL_TABLET | Freq: Four times a day (QID) | ORAL | Status: DC | PRN
  Administered 2023-10-03: 650 mg
  Filled 2023-10-03: qty 2

## 2023-10-03 MED ORDER — MORPHINE SULFATE (PF) 2 MG/ML IV SOLN: 2.0000 mg | INTRAVENOUS | Status: DC | PRN

## 2023-10-03 MED ORDER — MORPHINE SULFATE (PF) 4 MG/ML IV SOLN
4.0000 mg | Freq: Once | INTRAVENOUS | Status: AC
  Administered 2023-10-03: 4 mg via INTRAVENOUS
  Filled 2023-10-03: qty 1

## 2023-10-03 MED ORDER — IOHEXOL 350 MG/ML SOLN
75.0000 mL | Freq: Once | INTRAVENOUS | Status: AC | PRN
  Administered 2023-10-03: 75 mL via INTRAVENOUS

## 2023-10-03 MED ORDER — ONDANSETRON 4 MG PO TBDP: 4.0000 mg | ORAL_TABLET | Freq: Once | ORAL | Status: DC

## 2023-10-03 MED ORDER — POTASSIUM CHLORIDE 10 MEQ/100ML IV SOLN
10.0000 meq | INTRAVENOUS | Status: AC
  Administered 2023-10-03 (×2): 10 meq via INTRAVENOUS
  Filled 2023-10-03 (×2): qty 100

## 2023-10-03 MED ORDER — SODIUM CHLORIDE 0.9 % IV SOLN
INTRAVENOUS | Status: AC
Start: 2023-10-03 — End: 2023-10-04

## 2023-10-03 MED ORDER — ONDANSETRON HCL 4 MG/2ML IJ SOLN
4.0000 mg | Freq: Once | INTRAMUSCULAR | Status: AC
  Administered 2023-10-03: 4 mg via INTRAVENOUS
  Filled 2023-10-03: qty 2

## 2023-10-03 MED ORDER — ONDANSETRON HCL 4 MG/2ML IJ SOLN
4.0000 mg | Freq: Four times a day (QID) | INTRAMUSCULAR | Status: DC | PRN
  Administered 2023-10-03: 4 mg via INTRAVENOUS
  Filled 2023-10-03: qty 2

## 2023-10-03 MED ORDER — ACETAMINOPHEN 650 MG RE SUPP: 650.0000 mg | Freq: Four times a day (QID) | RECTAL | Status: DC | PRN

## 2023-10-03 NOTE — Assessment & Plan Note (Signed)
Hemoconcentrated vs. Inflammatory No other criteria for SIRS/afebrile Gentle IVF and trend

## 2023-10-03 NOTE — ED Notes (Signed)
Placed pt on 3L 02 per [place] 

## 2023-10-03 NOTE — Assessment & Plan Note (Signed)
82 year old presenting to ED with one day history of worsening generalized abdominal pain, nausea and vomiting with past history of SBO found to have CT findings of SBO -Admit to MedSurg  -NPO -no clinical findings for emergent surgery at this time  -likely secondary to adhesions. Last SBO in 2022, feels similar  -Gentle IV fluid hydration -Pain medication -encourage ambulation -Monitor electrolytes -General surgery consulted, NG tube placed. Following

## 2023-10-03 NOTE — Assessment & Plan Note (Signed)
Recent A1C of 6.0 Continue lifestyle modifications

## 2023-10-03 NOTE — Assessment & Plan Note (Signed)
Sodium of 133 Possibly secondary to hypovolemia from vomiting vs. Hydrochlorothiazide or combination of both Gentle IVF and trend  Holding hydrochlorothiazide while NPO

## 2023-10-03 NOTE — ED Notes (Signed)
I advanced pt's NG tube 10 cm as suggested on abdominal x-ray.

## 2023-10-03 NOTE — Consult Note (Signed)
CC/Reason for consult: Small bowel obstruction  Requesting physician: Kennis Carina, MD  HPI: Stacey [name] is an 82 y.o. female with hx HTN, HLD, DM, GERD, prior adhesive SBOs (last 2022, managed nonoperatively) -presented to the emergency department with somewhat generalized crampy abdominal pain.  She notes that this began a couple hours after eating sweet potato casserole with pecans on Christmas Day.  Since then, she has had nausea and vomiting.  She also reports decreased flatus/bowel movements.  In the last 24 hours she has not passed gas or had a bowel movement.  Similar episode with similar symptoms for which she was admitted for for 2 days back in 2022.  Currently, she reports that her symptoms are much improved.  She denies any abdominal pain at present.  She has not been passing gas or had a bowel movement however.  She does believe that her distention has been improving.  PSH: Exlap/SBR/repair ventral hernia, extensive LOA - 2005 ([facility]) Exlap/incisional hernia repair - 2007 [facility])   Past Medical History:  Diagnosis Date   Arthritis    "knees" (05/27/2013)   Bowel obstruction (HCC)    Chronic lower back pain    "bulging discs" (05/27/2013)   GERD (gastroesophageal reflux disease)    Headache(784.0)    "maybe once/wk' (05/27/2013)   Hyperlipidemia    Hypertension     Past Surgical History:  Procedure Laterality Date   ABDOMINAL HYSTERECTOMY  1980's   APPENDECTOMY  1980's   BILATERAL OOPHORECTOMY Bilateral 2000   COLON SURGERY  10/09/2003   small bowel resection/notes 10/09/2003 (05/27/2013)   INGUINAL HERNIA REPAIR  10/09/2003; 2006   Laparotomy with extensive lysis of adhesions,/notes 10/09/2003 (05/27/2013);     Family History  Problem Relation Age of Onset   Heart disease Mother    Stroke Father    Hypertension Sister    Hypertension Brother    Hypertension Sister    Hypertension Sister    Hypertension Brother    Hypertension Brother     Social:   reports that she has quit smoking. Her smoking use included cigarettes. She has a 6.3 pack-year smoking history. She has never used smokeless tobacco. She reports that she does not drink alcohol and does not use drugs.  Allergies:  Allergies  Allergen Reactions   Clindamycin/Lincomycin Diarrhea        Colchicine     Makes her to weak   Other     OMNICEF- DIARRHEA   Phenergan [Promethazine Hcl] Swelling    Mouth swelling   Statins Other (See Comments)    Weakness all over    Medications: I have reviewed the patient's current medications.  Results for orders placed or performed during the hospital encounter of 10/02/23 (from the past 48 hours)  Lipase, blood     Status: None   Collection Time: 10/02/23 10:08 PM  Result Value Ref Range   Lipase 26 11 - 51 U/L    Comment: Performed at [facility], [address] [place], [place] [address]  Comprehensive metabolic panel     Status: Abnormal   Collection Time: 10/02/23 10:08 PM  Result Value Ref Range   Sodium 133 (L) 135 - 145 mmol/L   Potassium 3.2 (L) 3.5 - 5.1 mmol/L   Chloride 96 (L) 98 - 111 mmol/L   CO2 25 22 - 32 mmol/L   Glucose, Bld 185 (H) 70 - 99 mg/dL    Comment: Glucose reference range applies only to samples taken after fasting  for at least 8 hours.   BUN 20 8 - 23 mg/dL   Creatinine, Ser 4.09 0.44 - 1.00 mg/dL   Calcium 9.3 8.9 - 81.1 mg/dL   Total Protein 7.2 6.5 - 8.1 g/dL   Albumin 3.9 3.5 - 5.0 g/dL   AST 23 15 - 41 U/L   ALT 15 0 - 44 U/L   Alkaline Phosphatase 56 38 - 126 U/L   Total Bilirubin 0.6 <1.2 mg/dL   GFR, Estimated >91 >47 mL/min    Comment: (NOTE) Calculated using the CKD-EPI Creatinine Equation (2021)    Anion gap 12 5 - 15    Comment: Performed at [facility], [address] [place], [place] [address]  CBC     Status: Abnormal   Collection Time: 10/02/23 10:08 PM  Result Value Ref Range   WBC 15.6 (H) 4.0 - 10.5 K/uL   RBC 4.17 3.87 - 5.11 MIL/uL   Hemoglobin 12.4  12.0 - 15.0 g/dL   HCT 21.3 08.6 - 57.8 %   MCV 92.6 80.0 - 100.0 fL   MCH 29.7 26.0 - 34.0 pg   MCHC 32.1 30.0 - 36.0 g/dL   RDW 46.9 62.9 - 52.8 %   Platelets 199 150 - 400 K/uL   nRBC 0.0 0.0 - 0.2 %    Comment: Performed at [facility], [address] [place], [place] [address]    CT ABDOMEN PELVIS W CONTRAST Result Date: 10/03/2023 CLINICAL DATA:  Abdominal pain EXAM: CT ABDOMEN AND PELVIS WITH CONTRAST TECHNIQUE: Multidetector CT imaging of the abdomen and pelvis was performed using the standard protocol following bolus administration of intravenous contrast. RADIATION DOSE REDUCTION: This exam was performed according to the departmental dose-optimization program which includes automated exposure control, adjustment of the mA and/or kV according to patient size and/or use of iterative reconstruction technique. CONTRAST:  75mL OMNIPAQUE IOHEXOL 350 MG/ML SOLN COMPARISON:  07/04/2021 FINDINGS: Lower chest: Linear scarring or atelectasis in the lung bases. No effusions. Small hiatal hernia. Hepatobiliary: No focal hepatic abnormality. Gallbladder unremarkable. Pancreas: No focal abnormality or ductal dilatation. Spleen: No focal abnormality.  Normal size. Adrenals/Urinary Tract: 3.5 cm simple appearing cyst in the midpole of the right kidney. No follow-up imaging recommended. No stones or hydronephrosis. Adrenal glands and urinary bladder unremarkable. Stomach/Bowel: Dilated fluid-filled small bowel loops into the pelvis. Postsurgical change in the distal ileum where there is denervation dilatation. Just proximal to this dilated segment, there is an area of mild wall thickening within the distal ileum. This could reflect acute inflammation/enteritis or stricture. The terminal ileum is decompressed. Stomach and large bowel unremarkable. Scattered sigmoid diverticula. No active diverticulitis. Vascular/Lymphatic: Aortic atherosclerosis. No evidence of aneurysm or adenopathy. Reproductive: Prior  hysterectomy.  No adnexal masses. Other: No free fluid or free air. Musculoskeletal: No acute bony abnormality. IMPRESSION: Dilated fluid-filled small bowel loops into the pelvis to the level of the distal ileum compatible with small bowel obstruction. There appears to be a short segment with wall thickening which could be related to acute inflammation/enteritis or stricture. Aortic atherosclerosis. Sigmoid diverticulosis. Electronically Signed   By: Charlett Nose M.D.   On: 10/03/2023 00:46    ROS - all of the below systems have been reviewed with the patient and positives are indicated with bold text General: chills, fever or night sweats Eyes: blurry vision or double vision ENT: epistaxis or sore throat Allergy/Immunology: itchy/watery eyes or nasal congestion Hematologic/Lymphatic: bleeding problems, blood clots or swollen lymph nodes  Endocrine: temperature intolerance or unexpected weight changes Breast: new or changing breast lumps or nipple discharge Resp: cough, shortness of breath, or wheezing CV: chest pain or dyspnea on exertion GI: as per HPI GU: dysuria, trouble voiding, or hematuria MSK: joint pain or joint stiffness Neuro: TIA or stroke symptoms Derm: pruritus and skin lesion changes Psych: anxiety and depression  PE Blood pressure 112/65, pulse 82, temperature 98.2 F (36.8 C), resp. rate 18, height 5\' 3"  (1.6 m), weight 76.2 kg, SpO2 96%. Constitutional: NAD; conversant Eyes: Moist conjunctiva Lungs: Normal respiratory effort CV: RRR GI: Abd soft, mildly distended, nontender Psychiatric: Appropriate affect  Results for orders placed or performed during the hospital encounter of 10/02/23 (from the past 48 hours)  Lipase, blood     Status: None   Collection Time: 10/02/23 10:08 PM  Result Value Ref Range   Lipase 26 11 - 51 U/L    Comment: Performed at [facility], [address] [place], [place] [address]  Comprehensive metabolic panel     Status: Abnormal    Collection Time: 10/02/23 10:08 PM  Result Value Ref Range   Sodium 133 (L) 135 - 145 mmol/L   Potassium 3.2 (L) 3.5 - 5.1 mmol/L   Chloride 96 (L) 98 - 111 mmol/L   CO2 25 22 - 32 mmol/L   Glucose, Bld 185 (H) 70 - 99 mg/dL    Comment: Glucose reference range applies only to samples taken after fasting for at least 8 hours.   BUN 20 8 - 23 mg/dL   Creatinine, Ser 4.09 0.44 - 1.00 mg/dL   Calcium 9.3 8.9 - 81.1 mg/dL   Total Protein 7.2 6.5 - 8.1 g/dL   Albumin 3.9 3.5 - 5.0 g/dL   AST 23 15 - 41 U/L   ALT 15 0 - 44 U/L   Alkaline Phosphatase 56 38 - 126 U/L   Total Bilirubin 0.6 <1.2 mg/dL   GFR, Estimated >91 >47 mL/min    Comment: (NOTE) Calculated using the CKD-EPI Creatinine Equation (2021)    Anion gap 12 5 - 15    Comment: Performed at [facility], [address], [place], [place] [address]  CBC     Status: Abnormal   Collection Time: 10/02/23 10:08 PM  Result Value Ref Range   WBC 15.6 (H) 4.0 - 10.5 K/uL   RBC 4.17 3.87 - 5.11 MIL/uL   Hemoglobin 12.4 12.0 - 15.0 g/dL   HCT 21.3 08.6 - 57.8 %   MCV 92.6 80.0 - 100.0 fL   MCH 29.7 26.0 - 34.0 pg   MCHC 32.1 30.0 - 36.0 g/dL   RDW 46.9 62.9 - 52.8 %   Platelets 199 150 - 400 K/uL   nRBC 0.0 0.0 - 0.2 %    Comment: Performed at [facility], [address], [place], [place] [address]    CT ABDOMEN PELVIS W CONTRAST Result Date: 10/03/2023 CLINICAL DATA:  Abdominal pain EXAM: CT ABDOMEN AND PELVIS WITH CONTRAST TECHNIQUE: Multidetector CT imaging of the abdomen and pelvis was performed using the standard protocol following bolus administration of intravenous contrast. RADIATION DOSE REDUCTION: This exam was performed according to the departmental dose-optimization program which includes automated exposure control, adjustment of the mA and/or kV according to patient size and/or use of iterative reconstruction technique. CONTRAST:  75mL OMNIPAQUE IOHEXOL 350 MG/ML SOLN COMPARISON:  07/04/2021 FINDINGS:  Lower chest: Linear scarring or atelectasis in the lung bases. No effusions. Small hiatal  hernia. Hepatobiliary: No focal hepatic abnormality. Gallbladder unremarkable. Pancreas: No focal abnormality or ductal dilatation. Spleen: No focal abnormality.  Normal size. Adrenals/Urinary Tract: 3.5 cm simple appearing cyst in the midpole of the right kidney. No follow-up imaging recommended. No stones or hydronephrosis. Adrenal glands and urinary bladder unremarkable. Stomach/Bowel: Dilated fluid-filled small bowel loops into the pelvis. Postsurgical change in the distal ileum where there is denervation dilatation. Just proximal to this dilated segment, there is an area of mild wall thickening within the distal ileum. This could reflect acute inflammation/enteritis or stricture. The terminal ileum is decompressed. Stomach and large bowel unremarkable. Scattered sigmoid diverticula. No active diverticulitis. Vascular/Lymphatic: Aortic atherosclerosis. No evidence of aneurysm or adenopathy. Reproductive: Prior hysterectomy.  No adnexal masses. Other: No free fluid or free air. Musculoskeletal: No acute bony abnormality. IMPRESSION: Dilated fluid-filled small bowel loops into the pelvis to the level of the distal ileum compatible with small bowel obstruction. There appears to be a short segment with wall thickening which could be related to acute inflammation/enteritis or stricture. Aortic atherosclerosis. Sigmoid diverticulosis. Electronically Signed   By: Charlett Nose M.D.   On: 10/03/2023 00:46     A/P: KAILIA DEVILLA is an 82 y.o. female with HTN, HLD, DM, GERD, prior adhesive SBOs -here with suspected adhesive SBO  - Admit with medicine - NPO, MIVF - SBO protocol has been ordered -Spent time going over all this with her including her CT findings.  We discussed initial plans for management of small bowel obstruction.  We did discuss situations or surgery could be necessary.  She remains hopeful that this will  not be necessary. - We will follow with you.  Please let us know if any questions or concerns.  I spent a total of 65 minutes in both face-to-face and non-face-to-face activities, excluding procedures performed, for this visit on the date of this encounter.  Marin Olp, MD [facility], [facility]

## 2023-10-03 NOTE — ED Provider Notes (Signed)
[facility]EMERGENCY DEPT [facility] Emergency Department Provider Note MRN:  [id number]  Arrival date & time: 10/03/23     Chief Complaint   Abdominal Pain   History of Present Illness   Stacey [name] is a 82 y.o. year-old female with a history of hypertension, SBO presenting to the ED with chief complaint of abdominal pain.  Diffuse abdominal discomfort with nausea vomiting, decreased bowel movements.  Symptoms for the past 2 days.  Feels similar to prior blockages.  No fever.  Review of Systems  A thorough review of systems was obtained and all systems are negative except as noted in the HPI and PMH.   Patient's Health History    Past Medical History:  Diagnosis Date   Arthritis    "knees" (05/27/2013)   Bowel obstruction (HCC)    Chronic lower back pain    "bulging discs" (05/27/2013)   GERD (gastroesophageal reflux disease)    Headache(784.0)    "maybe once/wk' (05/27/2013)   Hyperlipidemia    Hypertension     Past Surgical History:  Procedure Laterality Date   ABDOMINAL HYSTERECTOMY  1980's   APPENDECTOMY  1980's   BILATERAL OOPHORECTOMY Bilateral 2000   COLON SURGERY  10/09/2003   small bowel resection/notes 10/09/2003 (05/27/2013)   INGUINAL HERNIA REPAIR  10/09/2003; 2006   Laparotomy with extensive lysis of adhesions,/notes 10/09/2003 (05/27/2013);     Family History  Problem Relation Age of Onset   Heart disease Mother    Stroke Father    Hypertension Sister    Hypertension Brother    Hypertension Sister    Hypertension Sister    Hypertension Brother    Hypertension Brother     Social History   Socioeconomic History   Marital status: Widowed    Spouse name: Not on file   Number of children: 2   Years of education: Not on file   Highest education level: Not on file  Occupational History   Occupation: retired  Tobacco Use   Smoking status: Former    Current packs/day: 0.25    Average packs/day: 0.3 packs/day for 25.0 years (6.3 ttl pk-yrs)    Types:  Cigarettes   Smokeless tobacco: Never  Vaping Use   Vaping status: Never Used  Substance and Sexual Activity   Alcohol use: No   Drug use: No   Sexual activity: Never  Other Topics Concern   Not on file  Social History Narrative   She lives alone in [place] - ground level apartment. Her 2 sons live in [place]   Social Drivers of Health   Financial Resource Strain: Low Risk  (12/30/2022)   Overall Financial Resource Strain (CARDIA)    Difficulty of Paying Living Expenses: Not hard at all  Food Insecurity: No Food Insecurity (12/30/2022)   Hunger Vital Sign    Worried About Running Out of Food in the Last Year: Never true    Ran Out of Food in the Last Year: Never true  Transportation Needs: No Transportation Needs (12/30/2022)   PRAPARE - Administrator, Civil Service (Medical): No    Lack of Transportation (Non-Medical): No  Physical Activity: Inactive (12/30/2022)   Exercise Vital Sign    Days of Exercise per Week: 0 days    Minutes of Exercise per Session: 0 min  Stress: No Stress Concern Present (12/30/2022)   [employer] of Occupational Health - Occupational Stress Questionnaire    Feeling of Stress : Not at all  Social Connections: Socially Isolated (12/30/2022)  Social Connection and Isolation Panel [NHANES]    Frequency of Communication with Friends and Family: More than three times a week    Frequency of Social Gatherings with Friends and Family: Three times a week    Attends Religious Services: Never    Active Member of Clubs or Organizations: No    Attends Banker Meetings: Never    Marital Status: Widowed  Intimate Partner Violence: Not At Risk (12/30/2022)   Humiliation, Afraid, Rape, and Kick questionnaire    Fear of Current or Ex-Partner: No    Emotionally Abused: No    Physically Abused: No    Sexually Abused: No     Physical Exam   Vitals:   10/02/23 2152 10/03/23 0237  BP:  (!) 111/58  Pulse:  81  Resp:  19  Temp:  98.2  F (36.8 C)  SpO2: 96% 96%    CONSTITUTIONAL: Well-appearing, NAD NEURO/PSYCH:  Alert and oriented x 3, no focal deficits EYES:  eyes equal and reactive ENT/NECK:  no LAD, no JVD CARDIO: Regular rate, well-perfused, normal S1 and S2 PULM:  CTAB no wheezing or rhonchi GI/GU: Moderate distention, nontender MSK/SPINE:  No gross deformities, no edema SKIN:  no rash, atraumatic   *Additional and/or pertinent findings included in MDM below  Diagnostic and Interventional Summary    EKG Interpretation Date/Time:    Ventricular Rate:    PR Interval:    QRS Duration:    QT Interval:    QTC Calculation:   R Axis:      Text Interpretation:         Labs Reviewed  COMPREHENSIVE METABOLIC PANEL - Abnormal; Notable for the following components:      Result Value   Sodium 133 (*)    Potassium 3.2 (*)    Chloride 96 (*)    Glucose, Bld 185 (*)    All other components within normal limits  CBC - Abnormal; Notable for the following components:   WBC 15.6 (*)    All other components within normal limits  LIPASE, BLOOD  URINALYSIS, ROUTINE W REFLEX MICROSCOPIC    CT ABDOMEN PELVIS W CONTRAST  Final Result      Medications  ondansetron (ZOFRAN-ODT) disintegrating tablet 4 mg (0 mg Oral Hold 10/03/23 0310)  lactated ringers bolus 1,000 mL (has no administration in time range)  ondansetron (ZOFRAN) injection 4 mg (has no administration in time range)  morphine (PF) 4 MG/ML injection 4 mg (has no administration in time range)  iohexol (OMNIPAQUE) 350 MG/ML injection 75 mL (75 mLs Intravenous Contrast Given 10/03/23 0026)     Procedures  /  Critical Care .Critical Care  Performed by: Sabas Sous, MD Authorized by: Sabas Sous, MD   Critical care provider statement:    Critical care time (minutes):  32   Critical care was necessary to treat or prevent imminent or life-threatening deterioration of the following conditions: Small bowel obstruction.   Critical care was  time spent personally by me on the following activities:  Development of treatment plan with patient or surrogate, discussions with consultants, evaluation of patient's response to treatment, examination of patient, ordering and review of laboratory studies, ordering and review of radiographic studies, ordering and performing treatments and interventions, pulse oximetry, re-evaluation of patient's condition and review of old charts   ED Course and Medical Decision Making  Initial Impression and Ddx Symptoms suspicious for SBO especially given patient's history.  Vitals reassuring, awaiting labs, CT  Past medical/surgical  history that increases complexity of ED encounter: SBO  Interpretation of Diagnostics I personally reviewed the laboratory assessment and my interpretation is as follows: Mild leukocytosis and hyponatremia  CT confirms SBO  Patient Reassessment and Ultimate Disposition/Management     Case discussed with Dr. Cliffton Asters of general surgery, general surgery will see patient in consultation in the morning.  Until then n.p.o., NG tube, hospitalist admission.  Patient management required discussion with the following services or consulting groups:  Hospitalist Service and General/Trauma Surgery  Complexity of Problems Addressed Acute illness or injury that poses threat of life of bodily function  Additional Data Reviewed and Analyzed Further history obtained from: Further history from spouse/family member  Additional Factors Impacting ED Encounter Risk Consideration of hospitalization  Elmer Sow. Pilar Plate, MD [facility] Emergency Medicine [facility] [email or web address]  Final Clinical Impressions(s) / ED Diagnoses     ICD-10-CM   1. SBO (small bowel obstruction) (HCC)  K56.609       ED Discharge Orders     None        Discharge Instructions Discussed with and Provided to Patient:   Discharge Instructions   None      Sabas Sous,  MD 10/03/23 [phone]

## 2023-10-03 NOTE — H&P (Signed)
History and Physical    Patient: Stacey [name] MVH:[id number] DOB: Oct 26, 1940 DOA: 10/02/2023 DOS: the patient was seen and examined on 10/03/2023 PCP: [name], FNP  Patient coming from: Home - lives alone. Ambulates independently.    Chief Complaint: abdominal pain, N/V   HPI: Stacey [name] is a 82 y.o. female with medical history significant of hypertension, hyperlipidemia, prediabetes, anxiety, previous SBOs presenting with abdominal pain, constipation, and vomiting. She states she started to have generalized abdominal pain on 12/26 that got progressively worse. She started to have nausea/vomiting yesterday afternoon as well. The pain continued to get more severe and she felt like it was similar her previous SBO so came to ED. She las had a BM on 12/25 and was normal, no blood. She denies any flatus. She has never had a colonoscopy. She denies any fever/chills. She last ate on 12/25. Abdominal pain rated a 5/10, but at home was a 10/10. Described as dull pain. Does not radiate, generalized in abdomen.   Previous episode for SBO where she was admitted for 2 days in 2022. She has history of ventral hernia repair with extensive lysis of adhesion in 2005 and incisional hernia repair in 2007.   Denies any fever/chills, vision changes/headaches, chest pain or palpitations, shortness of breath or cough, dysuria.   She does not smoke or drink alcohol.   ER Course:  vitals: afebrile, bp: 104/71, HR: 80, RR: 19, oxygen: 94% RA Pertinent labs: sodium: 133, potassium: 3.2, glucose: 185, WBC: 15.6  CT abdomen/pelvis: Dilated fluid-filled small bowel loops into the pelvis to the level of the distal ileum compatible with small bowel obstruction. There appears to be a short segment with wall thickening which could be related to acute inflammation/enteritis or stricture.  Aortic atherosclerosis., Sigmoid diverticulosis. In ED: general surgery consulted, NG tube placed. TRH asked to [facility] for  suspected SBO.    Review of Systems: As mentioned in the history of present illness. All other systems reviewed and are negative. Past Medical History:  Diagnosis Date   Arthritis    "knees" (05/27/2013)   Bowel obstruction (HCC)    Chronic lower back pain    "bulging discs" (05/27/2013)   GERD (gastroesophageal reflux disease)    Headache(784.0)    "maybe once/wk' (05/27/2013)   Hyperlipidemia    Hypertension    Past Surgical History:  Procedure Laterality Date   ABDOMINAL HYSTERECTOMY  1980's   APPENDECTOMY  1980's   BILATERAL OOPHORECTOMY Bilateral 2000   COLON SURGERY  10/09/2003   small bowel resection/notes 10/09/2003 (05/27/2013)   INGUINAL HERNIA REPAIR  10/09/2003; 2006   Laparotomy with extensive lysis of adhesions,/notes 10/09/2003 (05/27/2013);    Social History:  reports that she has quit smoking. Her smoking use included cigarettes. She has a 6.3 pack-year smoking history. She has never used smokeless tobacco. She reports that she does not drink alcohol and does not use drugs.  Allergies  Allergen Reactions   Clindamycin/Lincomycin Diarrhea        Colchicine     Makes her to weak   Other     OMNICEF- DIARRHEA   Phenergan [Promethazine Hcl] Swelling    Mouth swelling   Statins Other (See Comments)    Weakness all over    Family History  Problem Relation Age of Onset   Heart disease Mother    Stroke Father    Hypertension Sister    Hypertension Brother    Hypertension Sister    Hypertension Sister  Hypertension Brother    Hypertension Brother     Prior to Admission medications   Medication Sig Start Date End Date Taking? Authorizing Provider  acetaminophen (TYLENOL) 325 MG tablet Take 650 mg by mouth every 6 (six) hours as needed for moderate pain or fever.   Yes [provider]  atorvastatin (LIPITOR) 20 MG tablet Take 1 tablet (20 mg total) by mouth daily. Patient taking differently: Take 20 mg by mouth at bedtime. 02/28/23  Yes Hawks, Christy A,  FNP  busPIRone (BUSPAR) 5 MG tablet Take 1 tablet (5 mg total) by mouth 3 (three) times daily as needed. 05/22/23  Yes Hawks, Christy A, FNP  cetirizine (ZYRTEC ALLERGY) 10 MG tablet Take 1 tablet (10 mg total) by mouth daily. 07/01/23  Yes Hawks, Christy A, FNP  fluticasone (FLONASE) 50 MCG/ACT nasal spray Place 2 sprays into both nostrils daily. 05/22/23  Yes Hawks, Christy A, FNP  hydrochlorothiazide (HYDRODIURIL) 25 MG tablet Take 1 tablet by mouth once daily 08/25/23  Yes Hawks, [place] A, FNP  Multiple Vitamin (MULITIVITAMIN WITH MINERALS) TABS Take 1 tablet by mouth daily.   Yes [provider]  trolamine salicylate (BLUE-EMU HEMP) 10 % cream Apply 1 Application topically as needed for muscle pain.   Yes [provider]    Physical Exam: Vitals:   10/03/23 1145 10/03/23 1146 10/03/23 1147 10/03/23 1200  BP:    (!) 95/54  Pulse: 83 83 78 72  Resp: 16 18 16 15   Temp:      TempSrc:      SpO2: (!) 78% (!) 80% 98% 95%  Weight:      Height:       General:  Appears calm and comfortable and is in NAD. NG tube in place  Eyes:  PERRL, EOMI, normal lids, iris ENT:  grossly normal hearing, lips & tongue, mmm; appropriate dentition Neck:  no LAD, masses or thyromegaly; no carotid bruits Cardiovascular:  RRR, no m/r/g. 2+ LE edema bilaterally  Respiratory:   CTA bilaterally with no wheezes/rales/rhonchi.  Normal respiratory effort. Abdomen:  soft, generalized tenderness, no rebound/guarding, ND, hypoactive BS  Back:   normal alignment, no CVAT Skin:  no rash or induration seen on limited exam Musculoskeletal:  grossly normal tone BUE/BLE, good ROM, no bony abnormality Lower extremity:  Limited foot exam with no ulcerations.  2+ distal pulses. Psychiatric:  grossly normal mood and affect, speech fluent and appropriate, AOx3 Neurologic:  CN 2-12 grossly intact, moves all extremities in coordinated fashion, sensation intact   Radiological Exams on Admission: Independently  reviewed - see discussion in A/P where applicable  DG Abd Portable 1 View Result Date: 10/03/2023 CLINICAL DATA:  Repositioned enteric tube EXAM: PORTABLE ABDOMEN - 1 VIEW COMPARISON:  Same-day abdominal radiograph at 6:35 a.m. FINDINGS: Interval advancement of enteric tube with side hole and tip projecting over the left upper quadrant. Excreted contrast material within the right renal collecting system. Left basilar linear opacities, likely atelectasis. IMPRESSION: Interval advancement of enteric tube with side hole and tip projecting over the left upper quadrant, likely within the stomach. Electronically Signed   By: Agustin Cree M.D.   On: 10/03/2023 10:07   DG Abdomen 1 View Result Date: 10/03/2023 CLINICAL DATA:  82 year old female status post nasogastric tube placement. EXAM: ABDOMEN - 1 VIEW COMPARISON:  Abdominal radiograph 07/05/2021. FINDINGS: Enteric tube in position with tip in the proximal stomach and side port near the gastroesophageal junction. Visualized bowel gas pattern is unremarkable. Lower abdomen is  incompletely imaged. IMPRESSION: 1. Enteric tube is in the proximal stomach and should be advanced approximately 8-10 cm for more optimal placement. Electronically Signed   By: Trudie Reed M.D.   On: 10/03/2023 07:09   CT ABDOMEN PELVIS W CONTRAST Result Date: 10/03/2023 CLINICAL DATA:  Abdominal pain EXAM: CT ABDOMEN AND PELVIS WITH CONTRAST TECHNIQUE: Multidetector CT imaging of the abdomen and pelvis was performed using the standard protocol following bolus administration of intravenous contrast. RADIATION DOSE REDUCTION: This exam was performed according to the departmental dose-optimization program which includes automated exposure control, adjustment of the mA and/or kV according to patient size and/or use of iterative reconstruction technique. CONTRAST:  75mL OMNIPAQUE IOHEXOL 350 MG/ML SOLN COMPARISON:  07/04/2021 FINDINGS: Lower chest: Linear scarring or atelectasis in the  lung bases. No effusions. Small hiatal hernia. Hepatobiliary: No focal hepatic abnormality. Gallbladder unremarkable. Pancreas: No focal abnormality or ductal dilatation. Spleen: No focal abnormality.  Normal size. Adrenals/Urinary Tract: 3.5 cm simple appearing cyst in the midpole of the right kidney. No follow-up imaging recommended. No stones or hydronephrosis. Adrenal glands and urinary bladder unremarkable. Stomach/Bowel: Dilated fluid-filled small bowel loops into the pelvis. Postsurgical change in the distal ileum where there is denervation dilatation. Just proximal to this dilated segment, there is an area of mild wall thickening within the distal ileum. This could reflect acute inflammation/enteritis or stricture. The terminal ileum is decompressed. Stomach and large bowel unremarkable. Scattered sigmoid diverticula. No active diverticulitis. Vascular/Lymphatic: Aortic atherosclerosis. No evidence of aneurysm or adenopathy. Reproductive: Prior hysterectomy.  No adnexal masses. Other: No free fluid or free air. Musculoskeletal: No acute bony abnormality. IMPRESSION: Dilated fluid-filled small bowel loops into the pelvis to the level of the distal ileum compatible with small bowel obstruction. There appears to be a short segment with wall thickening which could be related to acute inflammation/enteritis or stricture. Aortic atherosclerosis. Sigmoid diverticulosis. Electronically Signed   By: Charlett Nose M.D.   On: 10/03/2023 00:46    EKG: Independently reviewed.  NSR with rate 80; nonspecific ST changes with no evidence of acute ischemia   Labs on Admission: I have personally reviewed the available labs and imaging studies at the time of the admission.  Pertinent labs:   sodium: 133,  potassium: 3.2,  glucose: 185,  WBC: 15.6   Assessment and Plan: Principal Problem:   Small bowel obstruction (HCC) Active Problems:   Hypokalemia   Leukocytosis   Hyponatremia   Prediabetes    Hypertension   Hyperlipemia   GAD (generalized anxiety disorder)    Assessment and Plan: * Small bowel obstruction (HCC) 82 year old presenting to ED with one day history of worsening generalized abdominal pain, nausea and vomiting with past history of SBO found to have CT findings of SBO -Admit to MedSurg  -NPO -no clinical findings for emergent surgery at this time  -likely secondary to adhesions. Last SBO in 2022, feels similar  -Gentle IV fluid hydration -Pain medication -encourage ambulation -Monitor electrolytes -General surgery consulted, NG tube placed. Following    Hypokalemia Check magnesium Likely from either vomiting or hydrochlorothiazide  Replete and trend   Leukocytosis Hemoconcentrated vs. Inflammatory No other criteria for SIRS/afebrile Gentle IVF and trend   Hyponatremia Sodium of 133 Possibly secondary to hypovolemia from vomiting vs. Hydrochlorothiazide or combination of both Gentle IVF and trend  Holding hydrochlorothiazide while NPO   Prediabetes Recent A1C of 6.0 Continue lifestyle modifications  Hypertension Blood pressure to goal Hydrochlorothiazide possibly contributing to hyponatremia/hypokalemia  Hold hydrochlorothiazide  while NPO IV labetaolol if needed   Hyperlipemia Continue lipitor 20mg    GAD (generalized anxiety disorder) Continue buspar when able to tolerate PO     Advance Care Planning:   Code Status: Limited: Do not attempt resuscitation (DNR) -DNR-LIMITED -Do Not Intubate/DNI    Consults: general surgery: Dr. Cliffton Asters   DVT Prophylaxis: SCDs   Family Communication: son at bedside  Severity of Illness: The appropriate patient status for this patient is OBSERVATION. Observation status is judged to be reasonable and necessary in order to provide the required intensity of service to ensure the patient's safety. The patient's presenting symptoms, physical exam findings, and initial radiographic and laboratory data in the  context of their medical condition is felt to place them at decreased risk for further clinical deterioration. Furthermore, it is anticipated that the patient will be medically stable for discharge from the hospital within 2 midnights of admission.   Author: Orland Mustard, MD 10/03/2023 12:55 PM  For on call review [email or web address].

## 2023-10-03 NOTE — Assessment & Plan Note (Addendum)
Blood pressure to goal Hydrochlorothiazide possibly contributing to hyponatremia/hypokalemia  Hold hydrochlorothiazide while NPO IV labetaolol if needed

## 2023-10-03 NOTE — Assessment & Plan Note (Signed)
Continue buspar when able to tolerate PO

## 2023-10-03 NOTE — Assessment & Plan Note (Signed)
Continue lipitor 20 mg 

## 2023-10-03 NOTE — Assessment & Plan Note (Signed)
Check magnesium Likely from either vomiting or hydrochlorothiazide  Replete and trend

## 2023-10-03 NOTE — ED Notes (Signed)
ED TO INPATIENT HANDOFF REPORT  ED Nurse Name and Phone #: Richarda Osmond Name/Age/Gender Stacey [name] 82 y.o. female Room/Bed: 041C/041C  Code Status   Code Status: Limited: Do not attempt resuscitation (DNR) -DNR-LIMITED -Do Not Intubate/DNI   Home/SNF/Other Home Patient oriented to: self, place, time, and situation Is this baseline? Yes   Triage Complete: Triage complete  Chief Complaint Small bowel obstruction (HCC) [K56.609]  Triage Note Complaining of abdominal pain from under her breast all the way to her pubis. Started this morning. She has not been able to pass stool and has vomited once. Can not eat or drink anything. She thinks she has another bowel obstruction.   Allergies Allergies  Allergen Reactions   Clindamycin/Lincomycin Diarrhea        Colchicine     Makes her to weak   Other     OMNICEF- DIARRHEA   Phenergan [Promethazine Hcl] Swelling    Mouth swelling   Statins Other (See Comments)    Weakness all over    Level of Care/Admitting Diagnosis ED Disposition     ED Disposition  Admit   Condition  --   Comment  Hospital Area: [facility] [[id number]]  Level of Care: Med-Surg [16]  May place patient in observation at [facility] or [facility] if equivalent level of care is available:: No  Covid Evaluation: Asymptomatic - no recent exposure (last 10 days) testing not required  Diagnosis: Small bowel obstruction [facility]) [[id number]]  Admitting Physician: Orland Mustard [[id number]]  Attending Physician: Alton Revere          B Medical/Surgery History Past Medical History:  Diagnosis Date   Arthritis    "knees" (05/27/2013)   Bowel obstruction (HCC)    Chronic lower back pain    "bulging discs" (05/27/2013)   GERD (gastroesophageal reflux disease)    Headache(784.0)    "maybe once/wk' (05/27/2013)   Hyperlipidemia    Hypertension    Past Surgical History:  Procedure Laterality Date   ABDOMINAL HYSTERECTOMY   1980's   APPENDECTOMY  1980's   BILATERAL OOPHORECTOMY Bilateral 2000   COLON SURGERY  10/09/2003   small bowel resection/notes 10/09/2003 (05/27/2013)   INGUINAL HERNIA REPAIR  10/09/2003; 2006   Laparotomy with extensive lysis of adhesions,/notes 10/09/2003 (05/27/2013);      A IV Location/Drains/Wounds Patient Lines/Drains/Airways Status     Active Line/Drains/Airways     Name Placement date Placement time Site Days   Peripheral IV 10/02/23 20 G 1" Anterior;Left Forearm 10/02/23  2209  Forearm  1   NG/OG Vented/Dual Lumen 16 Fr. Right nare Marking at nare/corner of mouth 55 cm 10/03/23  0625  Right nare  less than 1            Intake/Output Last 24 hours No intake or output data in the 24 hours ending 10/03/23 1238  Labs/Imaging Results for orders placed or performed during the hospital encounter of 10/02/23 (from the past 48 hours)  Lipase, blood     Status: None   Collection Time: 10/02/23 10:08 PM  Result Value Ref Range   Lipase 26 11 - 51 U/L    Comment: Performed at [facility], [address] [place], [place] [address]  Comprehensive metabolic panel     Status: Abnormal   Collection Time: 10/02/23 10:08 PM  Result Value Ref Range   Sodium 133 (L) 135 - 145 mmol/L   Potassium 3.2 (L) 3.5 - 5.1 mmol/L   Chloride 96 (  L) 98 - 111 mmol/L   CO2 25 22 - 32 mmol/L   Glucose, Bld 185 (H) 70 - 99 mg/dL    Comment: Glucose reference range applies only to samples taken after fasting for at least 8 hours.   BUN 20 8 - 23 mg/dL   Creatinine, Ser 1.61 0.44 - 1.00 mg/dL   Calcium 9.3 8.9 - 09.6 mg/dL   Total Protein 7.2 6.5 - 8.1 g/dL   Albumin 3.9 3.5 - 5.0 g/dL   AST 23 15 - 41 U/L   ALT 15 0 - 44 U/L   Alkaline Phosphatase 56 38 - 126 U/L   Total Bilirubin 0.6 <1.2 mg/dL   GFR, Estimated >04 >54 mL/min    Comment: (NOTE) Calculated using the CKD-EPI Creatinine Equation (2021)    Anion gap 12 5 - 15    Comment: Performed at [facility], [address],  [place], [place] [address]  CBC     Status: Abnormal   Collection Time: 10/02/23 10:08 PM  Result Value Ref Range   WBC 15.6 (H) 4.0 - 10.5 K/uL   RBC 4.17 3.87 - 5.11 MIL/uL   Hemoglobin 12.4 12.0 - 15.0 g/dL   HCT 91.4 78.2 - 95.6 %   MCV 92.6 80.0 - 100.0 fL   MCH 29.7 26.0 - 34.0 pg   MCHC 32.1 30.0 - 36.0 g/dL   RDW 21.3 08.6 - 57.8 %   Platelets 199 150 - 400 K/uL   nRBC 0.0 0.0 - 0.2 %    Comment: Performed at [facility], [address], [place], [place] [address]  Comprehensive metabolic panel     Status: Abnormal   Collection Time: 10/03/23  7:58 AM  Result Value Ref Range   Sodium 135 135 - 145 mmol/L   Potassium 3.1 (L) 3.5 - 5.1 mmol/L   Chloride 96 (L) 98 - 111 mmol/L   CO2 29 22 - 32 mmol/L   Glucose, Bld 136 (H) 70 - 99 mg/dL    Comment: Glucose reference range applies only to samples taken after fasting for at least 8 hours.   BUN 19 8 - 23 mg/dL   Creatinine, Ser 9.52 0.44 - 1.00 mg/dL   Calcium 8.7 (L) 8.9 - 10.3 mg/dL   Total Protein 6.3 (L) 6.5 - 8.1 g/dL   Albumin 3.3 (L) 3.5 - 5.0 g/dL   AST 25 15 - 41 U/L   ALT 14 0 - 44 U/L   Alkaline Phosphatase 49 38 - 126 U/L   Total Bilirubin 0.6 <1.2 mg/dL   GFR, Estimated >84 >13 mL/min    Comment: (NOTE) Calculated using the CKD-EPI Creatinine Equation (2021)    Anion gap 10 5 - 15    Comment: Performed at [facility], [address], [place], [place] [address]  Magnesium     Status: None   Collection Time: 10/03/23  7:58 AM  Result Value Ref Range   Magnesium 1.8 1.7 - 2.4 mg/dL    Comment: Performed at [facility], [address], [place], [place] [address]  CBC with Differential/Platelet     Status: Abnormal   Collection Time: 10/03/23  7:58 AM  Result Value Ref Range   WBC 5.2 4.0 - 10.5 K/uL   RBC 3.87 3.87 - 5.11 MIL/uL   Hemoglobin 11.7 (L) 12.0 - 15.0 g/dL   HCT 36.6 (L) 44.0 - 34.7 %   MCV 92.5 80.0 - 100.0 fL  MCH 30.2 26.0 - 34.0 pg   MCHC 32.7 30.0 - 36.0 g/dL   RDW 40.9  81.1 - 91.4 %   Platelets 195 150 - 400 K/uL   nRBC 0.0 0.0 - 0.2 %   Neutrophils Relative % 80 %   Neutro Abs 4.2 1.7 - 7.7 K/uL   Lymphocytes Relative 12 %   Lymphs Abs 0.6 (L) 0.7 - 4.0 K/uL   Monocytes Relative 7 %   Monocytes Absolute 0.3 0.1 - 1.0 K/uL   Eosinophils Relative 0 %   Eosinophils Absolute 0.0 0.0 - 0.5 K/uL   Basophils Relative 1 %   Basophils Absolute 0.0 0.0 - 0.1 K/uL   Immature Granulocytes 0 %   Abs Immature Granulocytes 0.00 0.00 - 0.07 K/uL    Comment: Performed at [facility], [address], [place], [place] [address]  TSH     Status: None   Collection Time: 10/03/23  7:58 AM  Result Value Ref Range   TSH 0.845 0.350 - 4.500 uIU/mL    Comment: Performed by a 3rd Generation assay with a functional sensitivity of <=0.01 uIU/mL. Performed at [facility], [address] [place], [place] [address]    DG Abd Portable 1 View Result Date: 10/03/2023 CLINICAL DATA:  Repositioned enteric tube EXAM: PORTABLE ABDOMEN - 1 VIEW COMPARISON:  Same-day abdominal radiograph at 6:35 a.m. FINDINGS: Interval advancement of enteric tube with side hole and tip projecting over the left upper quadrant. Excreted contrast material within the right renal collecting system. Left basilar linear opacities, likely atelectasis. IMPRESSION: Interval advancement of enteric tube with side hole and tip projecting over the left upper quadrant, likely within the stomach. Electronically Signed   By: Agustin Cree M.D.   On: 10/03/2023 10:07   DG Abdomen 1 View Result Date: 10/03/2023 CLINICAL DATA:  82 year old female status post nasogastric tube placement. EXAM: ABDOMEN - 1 VIEW COMPARISON:  Abdominal radiograph 07/05/2021. FINDINGS: Enteric tube in position with tip in the proximal stomach and side port near the gastroesophageal junction. Visualized bowel gas pattern is unremarkable. Lower abdomen is incompletely imaged. IMPRESSION: 1. Enteric tube is in the proximal stomach and should  be advanced approximately 8-10 cm for more optimal placement. Electronically Signed   By: Trudie Reed M.D.   On: 10/03/2023 07:09   CT ABDOMEN PELVIS W CONTRAST Result Date: 10/03/2023 CLINICAL DATA:  Abdominal pain EXAM: CT ABDOMEN AND PELVIS WITH CONTRAST TECHNIQUE: Multidetector CT imaging of the abdomen and pelvis was performed using the standard protocol following bolus administration of intravenous contrast. RADIATION DOSE REDUCTION: This exam was performed according to the departmental dose-optimization program which includes automated exposure control, adjustment of the mA and/or kV according to patient size and/or use of iterative reconstruction technique. CONTRAST:  75mL OMNIPAQUE IOHEXOL 350 MG/ML SOLN COMPARISON:  07/04/2021 FINDINGS: Lower chest: Linear scarring or atelectasis in the lung bases. No effusions. Small hiatal hernia. Hepatobiliary: No focal hepatic abnormality. Gallbladder unremarkable. Pancreas: No focal abnormality or ductal dilatation. Spleen: No focal abnormality.  Normal size. Adrenals/Urinary Tract: 3.5 cm simple appearing cyst in the midpole of the right kidney. No follow-up imaging recommended. No stones or hydronephrosis. Adrenal glands and urinary bladder unremarkable. Stomach/Bowel: Dilated fluid-filled small bowel loops into the pelvis. Postsurgical change in the distal ileum where there is denervation dilatation. Just proximal to this dilated segment, there is an area of mild wall thickening within the distal ileum. This could reflect acute inflammation/enteritis or stricture. The terminal ileum  is decompressed. Stomach and large bowel unremarkable. Scattered sigmoid diverticula. No active diverticulitis. Vascular/Lymphatic: Aortic atherosclerosis. No evidence of aneurysm or adenopathy. Reproductive: Prior hysterectomy.  No adnexal masses. Other: No free fluid or free air. Musculoskeletal: No acute bony abnormality. IMPRESSION: Dilated fluid-filled small bowel loops  into the pelvis to the level of the distal ileum compatible with small bowel obstruction. There appears to be a short segment with wall thickening which could be related to acute inflammation/enteritis or stricture. Aortic atherosclerosis. Sigmoid diverticulosis. Electronically Signed   By: Charlett Nose M.D.   On: 10/03/2023 00:46    Pending Labs Unresulted Labs (From admission, onward)     Start     Ordered   10/04/23 0500  Basic metabolic panel  Tomorrow morning,   R        10/03/23 0901   10/04/23 0500  CBC  Tomorrow morning,   R        10/03/23 0901   10/02/23 2155  Urinalysis, Routine w reflex microscopic -Urine, Clean Catch  Once,   URGENT       Question:  Specimen Source  Answer:  Urine, Clean Catch   10/02/23 2154            Vitals/Pain Today's Vitals   10/03/23 1145 10/03/23 1146 10/03/23 1147 10/03/23 1200  BP:    (!) 95/54  Pulse: 83 83 78 72  Resp: 16 18 16 15   Temp:      TempSrc:      SpO2: (!) 78% (!) 80% 98% 95%  Weight:      Height:      PainSc:        Isolation Precautions No active isolations  Medications Medications  ondansetron (ZOFRAN-ODT) disintegrating tablet 4 mg (0 mg Oral Hold 10/03/23 0310)  0.9 %  sodium chloride infusion ( Intravenous New Bag/Given 10/03/23 1010)  acetaminophen (TYLENOL) tablet 650 mg (650 mg Per Tube Given 10/03/23 1129)    Or  acetaminophen (TYLENOL) suppository 650 mg ( Rectal See Alternative 10/03/23 1129)  morphine (PF) 2 MG/ML injection 2 mg (has no administration in time range)  ondansetron (ZOFRAN) tablet 4 mg ( Per Tube See Alternative 10/03/23 1125)    Or  ondansetron (ZOFRAN) injection 4 mg (4 mg Intravenous Given 10/03/23 1125)  metoprolol tartrate (LOPRESSOR) injection 5 mg (has no administration in time range)  iohexol (OMNIPAQUE) 350 MG/ML injection 75 mL (75 mLs Intravenous Contrast Given 10/03/23 0026)  lactated ringers bolus 1,000 mL (0 mLs Intravenous Stopped 10/03/23 0625)  ondansetron (ZOFRAN)  injection 4 mg (4 mg Intravenous Given 10/03/23 0438)  morphine (PF) 4 MG/ML injection 4 mg (4 mg Intravenous Given 10/03/23 0439)  diatrizoate meglumine-sodium (GASTROGRAFIN) 66-10 % solution 90 mL (90 mLs Per NG tube Given 10/03/23 1040)    Mobility walks with person assist     Focused Assessments     R Recommendations: See Admitting Provider Note  Report given to:   Additional Notes:

## 2023-10-04 DIAGNOSIS — E871 Hypo-osmolality and hyponatremia: Secondary | ICD-10-CM | POA: Diagnosis present

## 2023-10-04 DIAGNOSIS — M545 Low back pain, unspecified: Secondary | ICD-10-CM | POA: Diagnosis present

## 2023-10-04 DIAGNOSIS — Z8249 Family history of ischemic heart disease and other diseases of the circulatory system: Secondary | ICD-10-CM | POA: Diagnosis not present

## 2023-10-04 DIAGNOSIS — Z79899 Other long term (current) drug therapy: Secondary | ICD-10-CM | POA: Diagnosis not present

## 2023-10-04 DIAGNOSIS — K56609 Unspecified intestinal obstruction, unspecified as to partial versus complete obstruction: Secondary | ICD-10-CM | POA: Diagnosis not present

## 2023-10-04 DIAGNOSIS — Z66 Do not resuscitate: Secondary | ICD-10-CM | POA: Diagnosis present

## 2023-10-04 DIAGNOSIS — F411 Generalized anxiety disorder: Secondary | ICD-10-CM | POA: Diagnosis present

## 2023-10-04 DIAGNOSIS — E876 Hypokalemia: Secondary | ICD-10-CM | POA: Diagnosis present

## 2023-10-04 DIAGNOSIS — D72829 Elevated white blood cell count, unspecified: Secondary | ICD-10-CM | POA: Diagnosis present

## 2023-10-04 DIAGNOSIS — K565 Intestinal adhesions [bands], unspecified as to partial versus complete obstruction: Secondary | ICD-10-CM | POA: Diagnosis present

## 2023-10-04 DIAGNOSIS — R1084 Generalized abdominal pain: Secondary | ICD-10-CM | POA: Diagnosis present

## 2023-10-04 DIAGNOSIS — E785 Hyperlipidemia, unspecified: Secondary | ICD-10-CM | POA: Diagnosis present

## 2023-10-04 DIAGNOSIS — G8929 Other chronic pain: Secondary | ICD-10-CM | POA: Diagnosis present

## 2023-10-04 DIAGNOSIS — K219 Gastro-esophageal reflux disease without esophagitis: Secondary | ICD-10-CM | POA: Diagnosis present

## 2023-10-04 DIAGNOSIS — K5669 Other partial intestinal obstruction: Secondary | ICD-10-CM | POA: Diagnosis not present

## 2023-10-04 DIAGNOSIS — I10 Essential (primary) hypertension: Secondary | ICD-10-CM | POA: Diagnosis present

## 2023-10-04 DIAGNOSIS — F17210 Nicotine dependence, cigarettes, uncomplicated: Secondary | ICD-10-CM | POA: Diagnosis present

## 2023-10-04 DIAGNOSIS — M170 Bilateral primary osteoarthritis of knee: Secondary | ICD-10-CM | POA: Diagnosis present

## 2023-10-04 DIAGNOSIS — Z823 Family history of stroke: Secondary | ICD-10-CM | POA: Diagnosis not present

## 2023-10-04 LAB — CBC
HCT: 33.3 % — ABNORMAL LOW (ref 36.0–46.0)
Hemoglobin: 10.8 g/dL — ABNORMAL LOW (ref 12.0–15.0)
MCH: 30.4 pg (ref 26.0–34.0)
MCHC: 32.4 g/dL (ref 30.0–36.0)
MCV: 93.8 fL (ref 80.0–100.0)
Platelets: 196 10*3/uL (ref 150–400)
RBC: 3.55 MIL/uL — ABNORMAL LOW (ref 3.87–5.11)
RDW: 13.4 % (ref 11.5–15.5)
WBC: 5.9 10*3/uL (ref 4.0–10.5)
nRBC: 0 % (ref 0.0–0.2)

## 2023-10-04 LAB — BASIC METABOLIC PANEL
Anion gap: 9 (ref 5–15)
BUN: 20 mg/dL (ref 8–23)
CO2: 28 mmol/L (ref 22–32)
Calcium: 8.5 mg/dL — ABNORMAL LOW (ref 8.9–10.3)
Chloride: 103 mmol/L (ref 98–111)
Creatinine, Ser: 0.93 mg/dL (ref 0.44–1.00)
GFR, Estimated: >60 mL/min (ref >60)
Glucose, Bld: 114 mg/dL — ABNORMAL HIGH (ref 70–99)
Potassium: 3.2 mmol/L — ABNORMAL LOW (ref 3.5–5.1)
Sodium: 140 mmol/L (ref 135–145)

## 2023-10-04 LAB — GLUCOSE, CAPILLARY
Glucose-Capillary: 102 mg/dL — ABNORMAL HIGH (ref 70–99)
Glucose-Capillary: 107 mg/dL — ABNORMAL HIGH (ref 70–99)
Glucose-Capillary: 114 mg/dL — ABNORMAL HIGH (ref 70–99)
Glucose-Capillary: 95 mg/dL (ref 70–99)

## 2023-10-04 MED ORDER — POTASSIUM CHLORIDE 10 MEQ/100ML IV SOLN
10.0000 meq | INTRAVENOUS | Status: AC
  Administered 2023-10-04 (×2): 10 meq via INTRAVENOUS
  Filled 2023-10-04 (×2): qty 100

## 2023-10-04 NOTE — Plan of Care (Signed)
  Problem: Education: Goal: Knowledge of General Education information will improve Description Including pain rating scale, medication(s)/side effects and non-pharmacologic comfort measures Outcome: Progressing   Problem: Health Behavior/Discharge Planning: Goal: Ability to manage health-related needs will improve Outcome: Progressing   

## 2023-10-04 NOTE — Progress Notes (Cosign Needed Addendum)
Subjective: CC: Reports her abdominal pain, distension and nausea has resolved. NGT output not documented over the last 24 hours. There is 100cc in the cannister currently. She reports they have not changed the cannister today - she is unsure if they changed it since she arrived to the floor. Several loose bm's yesterday. Yesterday's xray w/ contrast in colon. No NGT output recorded over the last 24 hours. Reports this feels similar to when her prior sbo's would resolve   Afebrile. Tachycardia resolved. No hypotension. WBC wnl yesterday. No labs done today.    Objective: Vital signs in last 24 hours: Temp:  [97.7 F (36.5 C)-98.5 F (36.9 C)] 98.3 F (36.8 C) (12/28 0745) Pulse Rate:  [71-100] 71 (12/28 0745) Resp:  [13-23] 16 (12/28 0745) BP: (95-116)/(54-81) 110/68 (12/28 0745) SpO2:  [78 %-98 %] 94 % (12/28 0745) Last BM Date : 10/04/23  Intake/Output from previous day: 12/27 0701 - 12/28 0700 In: 1317 [I.V.:1317] Out: -  Intake/Output this shift: No intake/output data recorded.  PE: Gen:  Alert, NAD, pleasant Abd: Soft, ND, NT, +BS, NGT in place on LIWS  Lab Results:  Recent Labs    10/02/23 2208 10/03/23 0758  WBC 15.6* 5.2  HGB 12.4 11.7*  HCT 38.6 35.8*  PLT 199 195   BMET Recent Labs    10/02/23 2208 10/03/23 0758  NA 133* 135  K 3.2* 3.1*  CL 96* 96*  CO2 25 29  GLUCOSE 185* 136*  BUN 20 19  CREATININE 0.95 0.90  CALCIUM 9.3 8.7*   PT/INR No results for input(s): "LABPROT", "INR" in the last 72 hours. CMP     Component Value Date/Time   NA 135 10/03/2023 0758   NA 139 05/22/2023 1109   K 3.1 (L) 10/03/2023 0758   CL 96 (L) 10/03/2023 0758   CO2 29 10/03/2023 0758   GLUCOSE 136 (H) 10/03/2023 0758   BUN 19 10/03/2023 0758   BUN 13 05/22/2023 1109   CREATININE 0.90 10/03/2023 0758   CREATININE 1.08 04/27/2013 1432   CALCIUM 8.7 (L) 10/03/2023 0758   PROT 6.3 (L) 10/03/2023 0758   PROT 7.0 05/22/2023 1109   ALBUMIN 3.3 (L)  10/03/2023 0758   ALBUMIN 4.4 05/22/2023 1109   AST 25 10/03/2023 0758   ALT 14 10/03/2023 0758   ALKPHOS 49 10/03/2023 0758   BILITOT 0.6 10/03/2023 0758   BILITOT 0.3 05/22/2023 1109   GFRNONAA >60 10/03/2023 0758   GFRAA 69 12/01/2020 1132   Lipase     Component Value Date/Time   LIPASE 26 10/02/2023 2208    Studies/Results: DG Abd Portable 1V-Small Bowel Obstruction Protocol-initial, 8 hr delay Result Date: 10/03/2023 CLINICAL DATA:  8 hour small-bowel delay. EXAM: PORTABLE ABDOMEN - 1 VIEW COMPARISON:  Abdominal radiograph dated 10/03/2023. FINDINGS: Enteric tube in similar position. Oral contrast noted throughout the small bowel and colon. IMPRESSION: Oral contrast traverses into the colon. Electronically Signed   By: Elgie Collard M.D.   On: 10/03/2023 19:03   DG Abd Portable 1 View Result Date: 10/03/2023 CLINICAL DATA:  Repositioned enteric tube EXAM: PORTABLE ABDOMEN - 1 VIEW COMPARISON:  Same-day abdominal radiograph at 6:35 a.m. FINDINGS: Interval advancement of enteric tube with side hole and tip projecting over the left upper quadrant. Excreted contrast material within the right renal collecting system. Left basilar linear opacities, likely atelectasis. IMPRESSION: Interval advancement of enteric tube with side hole and tip projecting over the left upper quadrant, likely within the  stomach. Electronically Signed   By: Agustin Cree M.D.   On: 10/03/2023 10:07   DG Abdomen 1 View Result Date: 10/03/2023 CLINICAL DATA:  82 year old female status post nasogastric tube placement. EXAM: ABDOMEN - 1 VIEW COMPARISON:  Abdominal radiograph 07/05/2021. FINDINGS: Enteric tube in position with tip in the proximal stomach and side port near the gastroesophageal junction. Visualized bowel gas pattern is unremarkable. Lower abdomen is incompletely imaged. IMPRESSION: 1. Enteric tube is in the proximal stomach and should be advanced approximately 8-10 cm for more optimal placement.  Electronically Signed   By: Trudie Reed M.D.   On: 10/03/2023 07:09   CT ABDOMEN PELVIS W CONTRAST Result Date: 10/03/2023 CLINICAL DATA:  Abdominal pain EXAM: CT ABDOMEN AND PELVIS WITH CONTRAST TECHNIQUE: Multidetector CT imaging of the abdomen and pelvis was performed using the standard protocol following bolus administration of intravenous contrast. RADIATION DOSE REDUCTION: This exam was performed according to the departmental dose-optimization program which includes automated exposure control, adjustment of the mA and/or kV according to patient size and/or use of iterative reconstruction technique. CONTRAST:  75mL OMNIPAQUE IOHEXOL 350 MG/ML SOLN COMPARISON:  07/04/2021 FINDINGS: Lower chest: Linear scarring or atelectasis in the lung bases. No effusions. Small hiatal hernia. Hepatobiliary: No focal hepatic abnormality. Gallbladder unremarkable. Pancreas: No focal abnormality or ductal dilatation. Spleen: No focal abnormality.  Normal size. Adrenals/Urinary Tract: 3.5 cm simple appearing cyst in the midpole of the right kidney. No follow-up imaging recommended. No stones or hydronephrosis. Adrenal glands and urinary bladder unremarkable. Stomach/Bowel: Dilated fluid-filled small bowel loops into the pelvis. Postsurgical change in the distal ileum where there is denervation dilatation. Just proximal to this dilated segment, there is an area of mild wall thickening within the distal ileum. This could reflect acute inflammation/enteritis or stricture. The terminal ileum is decompressed. Stomach and large bowel unremarkable. Scattered sigmoid diverticula. No active diverticulitis. Vascular/Lymphatic: Aortic atherosclerosis. No evidence of aneurysm or adenopathy. Reproductive: Prior hysterectomy.  No adnexal masses. Other: No free fluid or free air. Musculoskeletal: No acute bony abnormality. IMPRESSION: Dilated fluid-filled small bowel loops into the pelvis to the level of the distal ileum compatible with  small bowel obstruction. There appears to be a short segment with wall thickening which could be related to acute inflammation/enteritis or stricture. Aortic atherosclerosis. Sigmoid diverticulosis. Electronically Signed   By: Charlett Nose M.D.   On: 10/03/2023 00:46    Anti-infectives: Anti-infectives (From admission, onward)    None        Assessment/Plan SBO - CT w/ Dilated fluid-filled small bowel loops into the pelvis to the level of the distal ileum compatible with small bowel obstruction. There is also an area of mild wall thickening within the distal ileum  - Patient with hx of Exlap/SBR/repair ventral hernia, extensive LOA - 2005 ([facility]) and Exlap/incisional hernia repair - 2007 [facility]). She also has had prior adhesive SBOs (last 2022, managed nonoperatively)  - HDS without fever, tachycardia or hypotension. No peritonitis on exam. WBC wnl. No peritonitis on exam. No current indication for emergency surgery  - Clinically and radiographically improving. Contrast in colon on xray and patient is having bowel function. D/c NGT and start liquid diet. Can ADAT. If pt tolerates diet advancement, okay for d/c later today from our standpoint. Message sent to [facility] to relay this.    FEN - D/c NGT. CLD, ADAT VTE - SCDs, Okay for chem ppx from a gen surg standpoint ID - None  I reviewed nursing notes, last 24 h vitals  and pain scores, last 48 h intake and output, last 24 h labs and trends, and last 24 h imaging results.   LOS: 0 days    Jacinto Halim , [facility] 10/04/2023, 8:24 AM Please see Amion for pager number during day hours 7:00am-4:30pm

## 2023-10-04 NOTE — Care Management Obs Status (Signed)
MEDICARE OBSERVATION STATUS NOTIFICATION   Patient Details  Name: Stacey [name] MRN: [id number] Date of Birth: [birth year]   Medicare Observation Status Notification Given:  Yes    Ronny Bacon, RN 10/04/2023, 6:55 AM

## 2023-10-04 NOTE — Progress Notes (Signed)
Patient had multiple loose watery BMs overnight

## 2023-10-04 NOTE — Progress Notes (Signed)
PROGRESS NOTE  Stacey [name]  DOB: Dec 23, 1940  PCP: [name], FNP VHQ:[id number]  DOA: 10/02/2023  LOS: 0 days  Hospital Day: 3  Brief narrative: Stacey [name] is a 82 y.o. female with PMH significant for prediabetes, HTN, HLD, arthritis, back pain, GERD, h/o ventral hernia repair with extensive adhesiolysis 2005 and incisional hernia repair 2007, prior adhesive SBOs (last 2022, managed nonoperatively)  12/27, patient presented to the ED generalized crampy abdominal pain, nausea, vomiting that started couple of hours after eating on Christmas Day.  No flatus or BM for 24 hours prior to presentation She has never had a colonoscopy.   Previous episode for SBO where she was admitted for 2 days in 2022.   In the ED, patient was afebrile, hemodynamically stable Labs with potassium low at 3.1 CT abdomen/pelvis showed dilated fluid-filled small bowel loops into the pelvis to the level of the distal ileum compatible with small bowel obstruction.  There also was a short segment with wall thickening which could be related to acute inflammation/enteritis or stricture. Sigmoid diverticulosis. EDP consulted general surgery Conservative management was planned NG tube was placed Admitted to [facility]  Subjective: Patient was seen and examined this morning.  Pleasant elderly Caucasian female.  Lying down in bed.  Sipping on clear liquid. Chart reviewed Overnight, no fever, hemodynamically stable  Assessment and plan: Small bowel obstruction Symptoms and imagings as above  Seen by general surgery.   Started on conservative management with NG tube suction. Abdominal series with contrast was obtained.  Contrast in colon this morning and patient is having bowel function. General surgery plans to DC NG tube and start clear liquid diet. If able to tolerate clear liquid diet.  Advance to full diet for tonight   Hypokalemia Potassium low at 3.1 this morning.  Replacement ordered. Recent Labs   Lab 10/02/23 2208 10/03/23 0758 10/04/23 0749  K 3.2* 3.1* 3.2*  MG  --  1.8  --     Hyponatremia Sodium of 133.  Possibly secondary to hypovolemia from vomiting vs. Hydrochlorothiazide or combination of both Improved with IV fluid Recent Labs  Lab 10/02/23 2208 10/03/23 0758 10/04/23 0749  NA 133* 135 140   Prediabetes Recent A1C of 6.0 Continue lifestyle modifications   Hypertension Blood pressure to goal PTA meds-HCTZ 25 mg daily Currently HCTZ on hold because of low electrolytes Continue to monitor  Hyperlipemia Continue lipitor 20mg     GAD (generalized anxiety disorder) Continue buspar when able to tolerate PO    Mobility: Encourage ambulation  Goals of care   Code Status: Limited: Do not attempt resuscitation (DNR) -DNR-LIMITED -Do Not Intubate/DNI      DVT prophylaxis:  SCDs Start: 10/03/23 0900 Place TED hose Start: 10/03/23 0900   Antimicrobials: None Fluid: None Consultants: General Surgery Family Communication: None at bedside  Status: Observation Level of care:  Med-Surg   Patient is from: Home Needs to continue in-hospital care: Started on clear liquid diet.  Plan to advance to full liquid diet for tonight and soft diet tomorrow.   Anticipated d/c to: Home tomorrow if able to tolerate diet advancement.      Diet:  Diet Order             Diet full liquid Room service appropriate? Yes; Fluid consistency: Thin  Diet effective 1400           Diet clear liquid Fluid consistency: Thin  Diet effective now  Scheduled Meds:  ondansetron  4 mg Oral Once    PRN meds: acetaminophen **OR** acetaminophen, metoprolol tartrate, morphine injection, ondansetron **OR** ondansetron (ZOFRAN) IV   Infusions:   potassium chloride      Antimicrobials: Anti-infectives (From admission, onward)    None       Objective: Vitals:   10/04/23 0745 10/04/23 1000  BP: 110/68   Pulse: 71   Resp: 16   Temp: 98.3 F  (36.8 C)   SpO2: 94% 95%    Intake/Output Summary (Last 24 hours) at 10/04/2023 1155 Last data filed at 10/04/2023 1041 Gross per 24 hour  Intake 1440.5 ml  Output 150 ml  Net 1290.5 ml   Filed Weights   10/02/23 2153  Weight: 76.2 kg   Weight change:  Body mass index is 29.76 kg/m.   Physical Exam: General exam: Pleasant, not in distress Skin: No rashes, lesions or ulcers. HEENT: Atraumatic, normocephalic, no obvious bleeding Lungs: Clear to auscultation bilaterally,  CVS: S1, S2, no murmur,   GI/Abd: Soft, nontender, nondistended, bowel sound present,   CNS: Alert, awake, oriented x 3 Psychiatry: Mood appropriate,  Extremities: No pedal edema, no calf tenderness,   Data Review: I have personally reviewed the laboratory data and studies available.  F/u labs  Unresulted Labs (From admission, onward)     Start     Ordered   10/05/23 0500  Basic metabolic panel  Tomorrow morning,   R       Question:  Specimen collection method  Answer:  Lab=Lab collect   10/04/23 1155   10/05/23 0500  CBC with Differential/Platelet  Tomorrow morning,   R       Question:  Specimen collection method  Answer:  Lab=Lab collect   10/04/23 1155   10/02/23 2155  Urinalysis, Routine w reflex microscopic -Urine, Clean Catch  Once,   URGENT       Question:  Specimen Source  Answer:  Urine, Clean Catch   10/02/23 2154           Total time spent in review of labs and imaging, patient evaluation, formulation of plan, documentation and communication with family: 45 minutes  Signed, Lorin Glass, MD [facility] 10/04/2023

## 2023-10-05 DIAGNOSIS — K56609 Unspecified intestinal obstruction, unspecified as to partial versus complete obstruction: Secondary | ICD-10-CM | POA: Diagnosis not present

## 2023-10-05 LAB — CBC WITH DIFFERENTIAL/PLATELET
Abs Immature Granulocytes: 0.01 10*3/uL (ref 0.00–0.07)
Basophils Absolute: 0 10*3/uL (ref 0.0–0.1)
Basophils Relative: 0 %
Eosinophils Absolute: 0.2 10*3/uL (ref 0.0–0.5)
Eosinophils Relative: 5 %
HCT: 32.2 % — ABNORMAL LOW (ref 36.0–46.0)
Hemoglobin: 10.4 g/dL — ABNORMAL LOW (ref 12.0–15.0)
Immature Granulocytes: 0 %
Lymphocytes Relative: 34 %
Lymphs Abs: 1.8 10*3/uL (ref 0.7–4.0)
MCH: 30.1 pg (ref 26.0–34.0)
MCHC: 32.3 g/dL (ref 30.0–36.0)
MCV: 93.3 fL (ref 80.0–100.0)
Monocytes Absolute: 0.5 10*3/uL (ref 0.1–1.0)
Monocytes Relative: 9 %
Neutro Abs: 2.6 10*3/uL (ref 1.7–7.7)
Neutrophils Relative %: 52 %
Platelets: 174 10*3/uL (ref 150–400)
RBC: 3.45 MIL/uL — ABNORMAL LOW (ref 3.87–5.11)
RDW: 13.5 % (ref 11.5–15.5)
WBC: 5.1 10*3/uL (ref 4.0–10.5)
nRBC: 0 % (ref 0.0–0.2)

## 2023-10-05 LAB — BASIC METABOLIC PANEL
Anion gap: 7 (ref 5–15)
BUN: 16 mg/dL (ref 8–23)
CO2: 28 mmol/L (ref 22–32)
Calcium: 8.2 mg/dL — ABNORMAL LOW (ref 8.9–10.3)
Chloride: 103 mmol/L (ref 98–111)
Creatinine, Ser: 0.86 mg/dL (ref 0.44–1.00)
GFR, Estimated: >60 mL/min (ref >60)
Glucose, Bld: 98 mg/dL (ref 70–99)
Potassium: 3.3 mmol/L — ABNORMAL LOW (ref 3.5–5.1)
Sodium: 138 mmol/L (ref 135–145)

## 2023-10-05 MED ORDER — POTASSIUM CHLORIDE CRYS ER 20 MEQ PO TBCR
40.0000 meq | EXTENDED_RELEASE_TABLET | Freq: Once | ORAL | Status: AC
  Administered 2023-10-05: 40 meq via ORAL
  Filled 2023-10-05: qty 2

## 2023-10-05 NOTE — Progress Notes (Signed)
PROGRESS NOTE  Stacey [name]  DOB: Feb 26, 1941  PCP: [name], FNP ZOX:[id number]  DOA: 10/02/2023  LOS: 1 day  Hospital Day: 4  Brief narrative: Stacey [name] is a 82 y.o. female with PMH significant for prediabetes, HTN, HLD, arthritis, back pain, GERD, h/o ventral hernia repair with extensive adhesiolysis 2005 and incisional hernia repair 2007, prior adhesive SBOs (last 2022, managed nonoperatively)  12/27, patient presented to the ED generalized crampy abdominal pain, nausea, vomiting that started couple of hours after eating on Christmas Day.  No flatus or BM for 24 hours prior to presentation She has never had a colonoscopy.   Previous episode for SBO where she was admitted for 2 days in 2022.   In the ED, patient was afebrile, hemodynamically stable Labs with potassium low at 3.1 CT abdomen/pelvis showed dilated fluid-filled small bowel loops into the pelvis to the level of the distal ileum compatible with small bowel obstruction.  There also was a short segment with wall thickening which could be related to acute inflammation/enteritis or stricture. Sigmoid diverticulosis. EDP consulted general surgery Conservative management was planned NG tube was placed Admitted to [facility]  Subjective: Patient was seen and examined this morning. Sitting up in bed.  On full liquid diet since last night.  Complains of some lower quadrant tenderness but wants to try soft diet.  Had few watery bowel movements since last night.  Assessment and plan: Small bowel obstruction Symptoms and imagings as above  Seen by general surgery.   Started on conservative management with NG tube suction. Abdominal series with contrast was obtained.  Contrast in colon this morning and patient is having bowel function. 12/28, NGT was discontinued patient was started on clear liquid diet and advance to full liquid diet On full liquid diet since last night.  Complains of some lower quadrant tenderness but  wants to try soft diet.  Had few watery bowel movements since last night. Will see how she tolerates soft diet today.  Hold off on discharge for today.  Hypokalemia Potassium low at 3.3 this morning.  Replacement ordered. Recent Labs  Lab 10/02/23 2208 10/03/23 0758 10/04/23 0749 10/05/23 0448  K 3.2* 3.1* 3.2* 3.3*  MG  --  1.8  --   --     Prediabetes Recent A1C of 6.0 Continue lifestyle modifications   Hypertension Blood pressure to goal PTA meds-HCTZ 25 mg daily Currently HCTZ on hold because of low electrolytes Continue to monitor  Hyperlipemia Continue lipitor 20mg     GAD (generalized anxiety disorder) Continue buspar when able to tolerate PO    Mobility: Encourage ambulation  Goals of care   Code Status: Limited: Do not attempt resuscitation (DNR) -DNR-LIMITED -Do Not Intubate/DNI      DVT prophylaxis:  SCDs Start: 10/03/23 0900 Place TED hose Start: 10/03/23 0900   Antimicrobials: None Fluid: None Consultants: General Surgery Family Communication: None at bedside  Status: Observation Level of care:  Med-Surg   Patient is from: Home Needs to continue in-hospital care: Currently in soft diet.  Monitor for next 24 hours because of abdominal pain Anticipated d/c to: Home tomorrow if able to tolerate diet advancement.      Diet:  Diet Order             DIET SOFT Room service appropriate? Yes; Fluid consistency: Thin  Diet effective now                   Scheduled Meds:  ondansetron  4 mg Oral Once    PRN meds: acetaminophen **OR** acetaminophen, metoprolol tartrate, morphine injection, ondansetron **OR** ondansetron (ZOFRAN) IV   Infusions:     Antimicrobials: Anti-infectives (From admission, onward)    None       Objective: Vitals:   10/05/23 0448 10/05/23 0713  BP: (!) 97/56 (!) 102/54  Pulse: 81 76  Resp: 20 16  Temp: 98.3 F (36.8 C) 98 F (36.7 C)  SpO2: 94% 94%    Intake/Output Summary (Last 24 hours) at  10/05/2023 1127 Last data filed at 10/05/2023 0900 Gross per 24 hour  Intake 790.56 ml  Output --  Net 790.56 ml   Filed Weights   10/02/23 2153  Weight: 76.2 kg   Weight change:  Body mass index is 29.76 kg/m.   Physical Exam: General exam: Pleasant, not in distress Skin: No rashes, lesions or ulcers. HEENT: Atraumatic, normocephalic, no obvious bleeding Lungs: Clear to auscultation bilaterally,  CVS: S1, S2, no murmur,   GI/Abd: Soft, nontender, nondistended, bowel sound present, LLQ abdominal tenderness present. CNS: Alert, awake, oriented x 3 Psychiatry: Mood appropriate,  Extremities: No pedal edema, no calf tenderness,   Data Review: I have personally reviewed the laboratory data and studies available.  F/u labs  Unresulted Labs (From admission, onward)     Start     Ordered   10/02/23 2155  Urinalysis, Routine w reflex microscopic -Urine, Clean Catch  Once,   URGENT       Question:  Specimen Source  Answer:  Urine, Clean Catch   10/02/23 2154           Total time spent in review of labs and imaging, patient evaluation, formulation of plan, documentation and communication with family: 45 minutes  Signed, Stacey Glass, MD [facility] 10/05/2023

## 2023-10-05 NOTE — Plan of Care (Signed)
  Problem: Activity: Goal: Risk for activity intolerance will decrease Outcome: Progressing   Problem: Nutrition: Goal: Adequate nutrition will be maintained Outcome: Progressing   Problem: Coping: Goal: Level of anxiety will decrease Outcome: Progressing   

## 2023-10-05 NOTE — Progress Notes (Addendum)
Subjective: CC: NGT out. Tolerated liquids yesterday without n/v but having some upper abdominal soreness and nausea this am. Continues to pass flatus and had a liquid bm last night. No PRN meds over the last 24 hours.    Objective: Vital signs in last 24 hours: Temp:  [97.7 F (36.5 C)-98.4 F (36.9 C)] 98 F (36.7 C) (12/29 0713) Pulse Rate:  [76-90] 76 (12/29 0713) Resp:  [16-20] 16 (12/29 0713) BP: (96-134)/(54-85) 102/54 (12/29 0713) SpO2:  [91 %-95 %] 94 % (12/29 0713) Last BM Date : 10/04/23  Intake/Output from previous day: 12/28 0701 - 12/29 0700 In: 914.1 [P.O.:600; I.V.:123.5; IV Piggyback:190.6] Out: 150 [Emesis/NG output:150] Intake/Output this shift: No intake/output data recorded.  PE: Gen:  Alert, NAD, pleasant Abd: Soft, mild distension, mild upper abdominal ttp without rigidity or guarding. +BS,   Lab Results:  Recent Labs    10/04/23 0749 10/05/23 0448  WBC 5.9 5.1  HGB 10.8* 10.4*  HCT 33.3* 32.2*  PLT 196 174   BMET Recent Labs    10/04/23 0749 10/05/23 0448  NA 140 138  K 3.2* 3.3*  CL 103 103  CO2 28 28  GLUCOSE 114* 98  BUN 20 16  CREATININE 0.93 0.86  CALCIUM 8.5* 8.2*   PT/INR No results for input(s): "LABPROT", "INR" in the last 72 hours. CMP     Component Value Date/Time   NA 138 10/05/2023 0448   NA 139 05/22/2023 1109   K 3.3 (L) 10/05/2023 0448   CL 103 10/05/2023 0448   CO2 28 10/05/2023 0448   GLUCOSE 98 10/05/2023 0448   BUN 16 10/05/2023 0448   BUN 13 05/22/2023 1109   CREATININE 0.86 10/05/2023 0448   CREATININE 1.08 04/27/2013 1432   CALCIUM 8.2 (L) 10/05/2023 0448   PROT 6.3 (L) 10/03/2023 0758   PROT 7.0 05/22/2023 1109   ALBUMIN 3.3 (L) 10/03/2023 0758   ALBUMIN 4.4 05/22/2023 1109   AST 25 10/03/2023 0758   ALT 14 10/03/2023 0758   ALKPHOS 49 10/03/2023 0758   BILITOT 0.6 10/03/2023 0758   BILITOT 0.3 05/22/2023 1109   GFRNONAA >60 10/05/2023 0448   GFRAA 69 12/01/2020 1132   Lipase      Component Value Date/Time   LIPASE 26 10/02/2023 2208    Studies/Results: DG Abd Portable 1V-Small Bowel Obstruction Protocol-initial, 8 hr delay Result Date: 10/03/2023 CLINICAL DATA:  8 hour small-bowel delay. EXAM: PORTABLE ABDOMEN - 1 VIEW COMPARISON:  Abdominal radiograph dated 10/03/2023. FINDINGS: Enteric tube in similar position. Oral contrast noted throughout the small bowel and colon. IMPRESSION: Oral contrast traverses into the colon. Electronically Signed   By: Elgie Collard M.D.   On: 10/03/2023 19:03   DG Abd Portable 1 View Result Date: 10/03/2023 CLINICAL DATA:  Repositioned enteric tube EXAM: PORTABLE ABDOMEN - 1 VIEW COMPARISON:  Same-day abdominal radiograph at 6:35 a.m. FINDINGS: Interval advancement of enteric tube with side hole and tip projecting over the left upper quadrant. Excreted contrast material within the right renal collecting system. Left basilar linear opacities, likely atelectasis. IMPRESSION: Interval advancement of enteric tube with side hole and tip projecting over the left upper quadrant, likely within the stomach. Electronically Signed   By: Agustin Cree M.D.   On: 10/03/2023 10:07    Anti-infectives: Anti-infectives (From admission, onward)    None        Assessment/Plan SBO - CT w/ Dilated fluid-filled small bowel loops into the pelvis to the level  of the distal ileum compatible with small bowel obstruction. There is also an area of mild wall thickening within the distal ileum  - Patient with hx of Exlap/SBR/repair ventral hernia, extensive LOA - 2005 ([place]) and Exlap/incisional hernia repair - 2007 [facility]). She also has had prior adhesive SBOs (last 2022, managed nonoperatively)  - No current indication for emergency surgery  - Contrast in colon on xray 12/27 and patient having bowel function. NGT out. She is having some nausea and upper abdominal soreness this am. No vomiting. Would like to ensure she does well with soft/low fiber diet  for breakfast and lunch  before discharge. If she has any worsening abdominal pain, distension, n/v would obtain xray and notify our team so we are aware.    FEN - Soft diet. Replace K (3.3 - hypokalemia) VTE - SCDs, Okay for chem ppx from a gen surg standpoint ID - None  I reviewed nursing notes, last 24 h vitals and pain scores, last 48 h intake and output, last 24 h labs and trends, and last 24 h imaging results.   LOS: 1 day    Jacinto Halim , [facility] 10/05/2023, 8:40 AM Please see Amion for pager number during day hours 7:00am-4:30pm

## 2023-10-06 DIAGNOSIS — K56609 Unspecified intestinal obstruction, unspecified as to partial versus complete obstruction: Secondary | ICD-10-CM | POA: Diagnosis not present

## 2023-10-06 NOTE — Progress Notes (Addendum)
Provided patient with AVS and discharge instructions.  All questions answered.  Patient to stay on floor until son arrives.  Son has been notified.

## 2023-10-06 NOTE — Progress Notes (Signed)
       Subjective: CC: Tolerating soft diet without abdominal pain, n/v. BM yesterday. No PRN meds yesterday. Afebrile. No tachycardia or hypotension over the last 24 hours.   Objective: Vital signs in last 24 hours: Temp:  [97.7 F (36.5 C)-98.6 F (37 C)] 98.6 F (37 C) (12/30 0600) Pulse Rate:  [74-86] 76 (12/30 0600) Resp:  [18] 18 (12/30 0600) BP: (105-124)/(61-79) 105/61 (12/30 0600) SpO2:  [93 %-94 %] 93 % (12/30 0600) Last BM Date : 10/05/23  Intake/Output from previous day: 12/29 0701 - 12/30 0700 In: 240 [P.O.:240] Out: -  Intake/Output this shift: No intake/output data recorded.  PE: Gen:  Alert, NAD, pleasant Abd: Soft, mild distension, mild upper abdominal ttp without rigidity or guarding. +BS,   Lab Results:  Recent Labs    10/04/23 0749 10/05/23 0448  WBC 5.9 5.1  HGB 10.8* 10.4*  HCT 33.3* 32.2*  PLT 196 174   BMET Recent Labs    10/04/23 0749 10/05/23 0448  NA 140 138  K 3.2* 3.3*  CL 103 103  CO2 28 28  GLUCOSE 114* 98  BUN 20 16  CREATININE 0.93 0.86  CALCIUM 8.5* 8.2*   PT/INR No results for input(s): "LABPROT", "INR" in the last 72 hours. CMP     Component Value Date/Time   NA 138 10/05/2023 0448   NA 139 05/22/2023 1109   K 3.3 (L) 10/05/2023 0448   CL 103 10/05/2023 0448   CO2 28 10/05/2023 0448   GLUCOSE 98 10/05/2023 0448   BUN 16 10/05/2023 0448   BUN 13 05/22/2023 1109   CREATININE 0.86 10/05/2023 0448   CREATININE 1.08 04/27/2013 1432   CALCIUM 8.2 (L) 10/05/2023 0448   PROT 6.3 (L) 10/03/2023 0758   PROT 7.0 05/22/2023 1109   ALBUMIN 3.3 (L) 10/03/2023 0758   ALBUMIN 4.4 05/22/2023 1109   AST 25 10/03/2023 0758   ALT 14 10/03/2023 0758   ALKPHOS 49 10/03/2023 0758   BILITOT 0.6 10/03/2023 0758   BILITOT 0.3 05/22/2023 1109   GFRNONAA >60 10/05/2023 0448   GFRAA 69 12/01/2020 1132   Lipase     Component Value Date/Time   LIPASE 26 10/02/2023 2208    Studies/Results: No results  found.   Anti-infectives: Anti-infectives (From admission, onward)    None        Assessment/Plan SBO - Clinically and radiographically resolved. Contrast in colon on xray. Tolerating soft diet without n/v and having bm's. Okay for d/c from our standpoint. Discussed return precautions. We will sign off. Discussed recs with primary.    I reviewed nursing notes, last 24 h vitals and pain scores, last 48 h intake and output, last 24 h labs and trends, and last 24 h imaging results.   LOS: 2 days    Jacinto Halim , [facility] 10/06/2023, 8:36 AM Please see Amion for pager number during day hours 7:00am-4:30pm

## 2023-10-06 NOTE — Plan of Care (Signed)
  Problem: Activity: Goal: Risk for activity intolerance will decrease Outcome: Progressing   Problem: Nutrition: Goal: Adequate nutrition will be maintained Outcome: Progressing   Problem: Coping: Goal: Level of anxiety will decrease Outcome: Progressing   

## 2023-10-06 NOTE — Discharge Instructions (Signed)
Follow with Stacey Spencer, FNP in 5-7 days  Please get a complete blood count and chemistry panel checked by your Primary MD at your next visit, and again as instructed by your Primary MD. Please get your medications reviewed and adjusted by your Primary MD.  Please request your Primary MD to go over all Hospital Tests and Procedure/Radiological results at the follow up, please get all Hospital records sent to your Prim MD by signing hospital release before you go home.  In some cases, there will be blood work, cultures and biopsy results pending at the time of your discharge. Please request that your primary care M.D. goes through all the records of your hospital data and follows up on these results.  If you had Pneumonia of Lung problems at the Hospital: Please get a 2 view Chest X ray done in 6-8 weeks after hospital discharge or sooner if instructed by your Primary MD.  If you have Congestive Heart Failure: Please call your Cardiologist or Primary MD anytime you have any of the following symptoms:  1) 3 pound weight gain in 24 hours or 5 pounds in 1 week  2) shortness of breath, with or without a dry hacking cough  3) swelling in the hands, feet or stomach  4) if you have to sleep on extra pillows at night in order to breathe  Follow cardiac low salt diet and 1.5 lit/day fluid restriction.  If you have diabetes Accuchecks 4 times/day, Once in AM empty stomach and then before each meal. Log in all results and show them to your primary doctor at your next visit. If any glucose reading is under 80 or above 300 call your primary MD immediately.  If you have Seizure/Convulsions/Epilepsy: Please do not drive, operate heavy machinery, participate in activities at heights or participate in high speed sports until you have seen by Primary MD or a Neurologist and advised to do so again. Per [facility] statutes, patients with seizures are not allowed to drive until they have been  seizure-free for six months.  Use caution when using heavy equipment or power tools. Avoid working on ladders or at heights. Take showers instead of baths. Ensure the water temperature is not too high on the home water heater. Do not go swimming alone. Do not lock yourself in a room alone (i.e. bathroom). When caring for infants or small children, sit down when holding, feeding, or changing them to minimize risk of injury to the child in the event you have a seizure. Maintain good sleep hygiene. Avoid alcohol.   If you had Gastrointestinal Bleeding: Please ask your Primary MD to check a complete blood count within one week of discharge or at your next visit. Your endoscopic/colonoscopic biopsies that are pending at the time of discharge, will also need to followed by your Primary MD.  Get Medicines reviewed and adjusted. Please take all your medications with you for your next visit with your Primary MD  Please request your Primary MD to go over all hospital tests and procedure/radiological results at the follow up, please ask your Primary MD to get all Hospital records sent to his/her office.  If you experience worsening of your admission symptoms, develop shortness of breath, life threatening emergency, suicidal or homicidal thoughts you must seek medical attention immediately by calling 911 or calling your MD immediately  if symptoms less severe.  You must read complete instructions/literature along with all the possible adverse reactions/side effects for all the Medicines you  take and that have been prescribed to you. Take any new Medicines after you have completely understood and accpet all the possible adverse reactions/side effects.   Do not drive or operate heavy machinery when taking Pain medications.   Do not take more than prescribed Pain, Sleep and Anxiety Medications  Special Instructions: If you have smoked or chewed Tobacco  in the last 2 yrs please stop smoking, stop any regular  Alcohol  and or any Recreational drug use.  Wear Seat belts while driving.  Please note You were cared for by a hospitalist during your hospital stay. If you have any questions about your discharge medications or the care you received while you were in the hospital after you are discharged, you can call the unit and asked to speak with the hospitalist on call if the hospitalist that took care of you is not available. Once you are discharged, your primary care physician will handle any further medical issues. Please note that NO REFILLS for any discharge medications will be authorized once you are discharged, as it is imperative that you return to your primary care physician (or establish a relationship with a primary care physician if you do not have one) for your aftercare needs so that they can reassess your need for medications and monitor your lab values.  You can reach the hospitalist office at phone [phone] or fax [phone]   If you do not have a primary care physician, you can call [phone] for a physician referral.  Activity: As tolerated with Full fall precautions use walker/cane & assistance as needed    Diet: regular  Disposition Home

## 2023-10-06 NOTE — Discharge Summary (Signed)
Physician Discharge Summary  Stacey [name] ZHY:[id number] DOB: [birth year] DOA: 10/02/2023  PCP: [name], FNP  Admit date: 10/02/2023 Discharge date: 10/06/2023  Admitted From: home Disposition:  home  Recommendations for Outpatient Follow-up:  Follow up with PCP in 1-2 weeks  Home Health: none Equipment/Devices: none  Discharge Condition: stable CODE STATUS: DNR Diet Orders (From admission, onward)     Start     Ordered   10/05/23 0815  DIET SOFT Room service appropriate? Yes; Fluid consistency: Thin  Diet effective now       Question Answer Comment  Room service appropriate? Yes   Fluid consistency: Thin      10/05/23 0814            HPI: Per admitting MD, NAVA HAZELRIGG is a 82 y.o. female with medical history significant of hypertension, hyperlipidemia, prediabetes, anxiety, previous SBOs presenting with abdominal pain, constipation, and vomiting. She states she started to have generalized abdominal pain on 12/26 that got progressively worse. She started to have nausea/vomiting yesterday afternoon as well. The pain continued to get more severe and she felt like it was similar her previous SBO so came to ED. She las had a BM on 12/25 and was normal, no blood. She denies any flatus. She has never had a colonoscopy. She denies any fever/chills. She last ate on 12/25. Abdominal pain rated a 5/10, but at home was a 10/10. Described as dull pain. Does not radiate, generalized in abdomen. Previous episode for SBO where she was admitted for 2 days in 2022. She has history of ventral hernia repair with extensive lysis of adhesion in 2005 and incisional hernia repair in 2007. Denies any fever/chills, vision changes/headaches, chest pain or palpitations, shortness of breath or cough, dysuria. She does not smoke or drink alcohol.   Hospital Course / Discharge diagnoses: Principal Problem:   Small bowel obstruction (HCC) Active Problems:   Hypokalemia   Leukocytosis    Hyponatremia   Prediabetes   Hypertension   Hyperlipemia   GAD (generalized anxiety disorder)   Principal problem Small bowel obstruction-patient was admitted to the hospital and managed conservatively.  General surgery was consulted and followed patient while hospitalized.  She initially required n.p.o., NG tube, IV fluids and with conservative management she improved.  NG tube was removed on 4/28, diet was gradually advanced and now tolerating a regular diet without any abdominal pain, nausea or vomiting.  She is having bowel movements.  With improvement, she will be discharged home in stable condition.  Active problems Hypokalemia-replaced Prediabetes-A1c of 6.0 Essential hypertension-continue home medications Hyperlipidemia-continue statin GAD-continue home medications  Sepsis ruled out   Discharge Instructions   Allergies as of 10/06/2023       Reactions   Clindamycin/lincomycin Diarrhea      Colchicine    Makes her to weak   Other    OMNICEF- DIARRHEA   Phenergan [promethazine Hcl] Swelling   Mouth swelling   Statins Other (See Comments)   Weakness all over        Medication List     TAKE these medications    acetaminophen 325 MG tablet Commonly known as: TYLENOL Take 650 mg by mouth every 6 (six) hours as needed for moderate pain or fever.   atorvastatin 20 MG tablet Commonly known as: LIPITOR Take 1 tablet (20 mg total) by mouth daily. What changed: when to take this   Blue-Emu Hemp 10 % cream Generic drug: trolamine salicylate Apply 1 Application topically  as needed for muscle pain.   busPIRone 5 MG tablet Commonly known as: BUSPAR Take 1 tablet (5 mg total) by mouth 3 (three) times daily as needed.   cetirizine 10 MG tablet Commonly known as: ZyrTEC Allergy Take 1 tablet (10 mg total) by mouth daily.   fluticasone 50 MCG/ACT nasal spray Commonly known as: FLONASE Place 2 sprays into both nostrils daily.   hydrochlorothiazide 25 MG  tablet Commonly known as: HYDRODIURIL Take 1 tablet by mouth once daily   multivitamin with minerals Tabs tablet Take 1 tablet by mouth daily.        Follow-up Information     [name], FNP Follow up in 1 week(s).   Specialty: Family Medicine Contact information: [address] [place] [address] [phone]                 Consultations: General surgery  Procedures/Studies:  DG Abd Portable 1V-Small Bowel Obstruction Protocol-initial, 8 hr delay Result Date: 10/03/2023 CLINICAL DATA:  8 hour small-bowel delay. EXAM: PORTABLE ABDOMEN - 1 VIEW COMPARISON:  Abdominal radiograph dated 10/03/2023. FINDINGS: Enteric tube in similar position. Oral contrast noted throughout the small bowel and colon. IMPRESSION: Oral contrast traverses into the colon. Electronically Signed   By: Elgie Collard M.D.   On: 10/03/2023 19:03   DG Abd Portable 1 View Result Date: 10/03/2023 CLINICAL DATA:  Repositioned enteric tube EXAM: PORTABLE ABDOMEN - 1 VIEW COMPARISON:  Same-day abdominal radiograph at 6:35 a.m. FINDINGS: Interval advancement of enteric tube with side hole and tip projecting over the left upper quadrant. Excreted contrast material within the right renal collecting system. Left basilar linear opacities, likely atelectasis. IMPRESSION: Interval advancement of enteric tube with side hole and tip projecting over the left upper quadrant, likely within the stomach. Electronically Signed   By: Agustin Cree M.D.   On: 10/03/2023 10:07   DG Abdomen 1 View Result Date: 10/03/2023 CLINICAL DATA:  82 year old female status post nasogastric tube placement. EXAM: ABDOMEN - 1 VIEW COMPARISON:  Abdominal radiograph 07/05/2021. FINDINGS: Enteric tube in position with tip in the proximal stomach and side port near the gastroesophageal junction. Visualized bowel gas pattern is unremarkable. Lower abdomen is incompletely imaged. IMPRESSION: 1. Enteric tube is in the proximal stomach  and should be advanced approximately 8-10 cm for more optimal placement. Electronically Signed   By: Trudie Reed M.D.   On: 10/03/2023 07:09   CT ABDOMEN PELVIS W CONTRAST Result Date: 10/03/2023 CLINICAL DATA:  Abdominal pain EXAM: CT ABDOMEN AND PELVIS WITH CONTRAST TECHNIQUE: Multidetector CT imaging of the abdomen and pelvis was performed using the standard protocol following bolus administration of intravenous contrast. RADIATION DOSE REDUCTION: This exam was performed according to the departmental dose-optimization program which includes automated exposure control, adjustment of the mA and/or kV according to patient size and/or use of iterative reconstruction technique. CONTRAST:  75mL OMNIPAQUE IOHEXOL 350 MG/ML SOLN COMPARISON:  07/04/2021 FINDINGS: Lower chest: Linear scarring or atelectasis in the lung bases. No effusions. Small hiatal hernia. Hepatobiliary: No focal hepatic abnormality. Gallbladder unremarkable. Pancreas: No focal abnormality or ductal dilatation. Spleen: No focal abnormality.  Normal size. Adrenals/Urinary Tract: 3.5 cm simple appearing cyst in the midpole of the right kidney. No follow-up imaging recommended. No stones or hydronephrosis. Adrenal glands and urinary bladder unremarkable. Stomach/Bowel: Dilated fluid-filled small bowel loops into the pelvis. Postsurgical change in the distal ileum where there is denervation dilatation. Just proximal to this dilated segment, there is an area of mild  wall thickening within the distal ileum. This could reflect acute inflammation/enteritis or stricture. The terminal ileum is decompressed. Stomach and large bowel unremarkable. Scattered sigmoid diverticula. No active diverticulitis. Vascular/Lymphatic: Aortic atherosclerosis. No evidence of aneurysm or adenopathy. Reproductive: Prior hysterectomy.  No adnexal masses. Other: No free fluid or free air. Musculoskeletal: No acute bony abnormality. IMPRESSION: Dilated fluid-filled small  bowel loops into the pelvis to the level of the distal ileum compatible with small bowel obstruction. There appears to be a short segment with wall thickening which could be related to acute inflammation/enteritis or stricture. Aortic atherosclerosis. Sigmoid diverticulosis. Electronically Signed   By: Charlett Nose M.D.   On: 10/03/2023 00:46     Subjective: - no chest pain, shortness of breath, no abdominal pain, nausea or vomiting.   Discharge Exam: BP 105/61 (BP Location: Right Arm)   Pulse 76   Temp 98.6 F (37 C) (Oral)   Resp 18   Ht 5\' 3"  (1.6 m)   Wt 76.2 kg   SpO2 93%   BMI 29.76 kg/m   General: Pt is alert, awake, not in acute distress Cardiovascular: RRR, S1/S2 +, no rubs, no gallops Respiratory: CTA bilaterally, no wheezing, no rhonchi Abdominal: Soft, NT, ND, bowel sounds + Extremities: no edema, no cyanosis    The results of significant diagnostics from this hospitalization (including imaging, microbiology, ancillary and laboratory) are listed below for reference.     Microbiology: No results found for this or any previous visit (from the past 240 hours).   Labs: Basic Metabolic Panel: Recent Labs  Lab 10/02/23 2208 10/03/23 0758 10/04/23 0749 10/05/23 0448  NA 133* 135 140 138  K 3.2* 3.1* 3.2* 3.3*  CL 96* 96* 103 103  CO2 25 29 28 28   GLUCOSE 185* 136* 114* 98  BUN 20 19 20 16   CREATININE 0.95 0.90 0.93 0.86  CALCIUM 9.3 8.7* 8.5* 8.2*  MG  --  1.8  --   --    Liver Function Tests: Recent Labs  Lab 10/02/23 2208 10/03/23 0758  AST 23 25  ALT 15 14  ALKPHOS 56 49  BILITOT 0.6 0.6  PROT 7.2 6.3*  ALBUMIN 3.9 3.3*   CBC: Recent Labs  Lab 10/02/23 2208 10/03/23 0758 10/04/23 0749 10/05/23 0448  WBC 15.6* 5.2 5.9 5.1  NEUTROABS  --  4.2  --  2.6  HGB 12.4 11.7* 10.8* 10.4*  HCT 38.6 35.8* 33.3* 32.2*  MCV 92.6 92.5 93.8 93.3  PLT 199 195 196 174   CBG: Recent Labs  Lab 10/03/23 2208 10/04/23 0022 10/04/23 0416  10/04/23 0737 10/04/23 1201  GLUCAP 131* 114* 107* 102* 95   Hgb A1c No results for input(s): "HGBA1C" in the last 72 hours. Lipid Profile No results for input(s): "CHOL", "HDL", "LDLCALC", "TRIG", "CHOLHDL", "LDLDIRECT" in the last 72 hours. Thyroid function studies No results for input(s): "TSH", "T4TOTAL", "T3FREE", "THYROIDAB" in the last 72 hours.  Invalid input(s): "FREET3" Urinalysis    Component Value Date/Time   COLORURINE YELLOW 07/18/2021 0224   APPEARANCEUR CLEAR 07/18/2021 0224   LABSPEC 1.004 (L) 07/18/2021 0224   PHURINE 7.0 07/18/2021 0224   GLUCOSEU NEGATIVE 07/18/2021 0224   HGBUR NEGATIVE 07/18/2021 0224   BILIRUBINUR NEGATIVE 07/18/2021 0224   KETONESUR NEGATIVE 07/18/2021 0224   PROTEINUR NEGATIVE 07/18/2021 0224   UROBILINOGEN 0.2 06/10/2013 1022   NITRITE NEGATIVE 07/18/2021 0224   LEUKOCYTESUR TRACE (A) 07/18/2021 0224    FURTHER DISCHARGE INSTRUCTIONS:   Get Medicines reviewed and adjusted:  Please take all your medications with you for your next visit with your Primary MD   Laboratory/radiological data: Please request your Primary MD to go over all hospital tests and procedure/radiological results at the follow up, please ask your Primary MD to get all Hospital records sent to his/her office.   In some cases, they will be blood work, cultures and biopsy results pending at the time of your discharge. Please request that your primary care M.D. goes through all the records of your hospital data and follows up on these results.   Also Note the following: If you experience worsening of your admission symptoms, develop shortness of breath, life threatening emergency, suicidal or homicidal thoughts you must seek medical attention immediately by calling 911 or calling your MD immediately  if symptoms less severe.   You must read complete instructions/literature along with all the possible adverse reactions/side effects for all the Medicines you take and  that have been prescribed to you. Take any new Medicines after you have completely understood and accpet all the possible adverse reactions/side effects.    Do not drive when taking Pain medications or sleeping medications (Benzodaizepines)   Do not take more than prescribed Pain, Sleep and Anxiety Medications. It is not advisable to combine anxiety,sleep and pain medications without talking with your primary care practitioner   Special Instructions: If you have smoked or chewed Tobacco  in the last 2 yrs please stop smoking, stop any regular Alcohol  and or any Recreational drug use.   Wear Seat belts while driving.   Please note: You were cared for by a hospitalist during your hospital stay. Once you are discharged, your primary care physician will handle any further medical issues. Please note that NO REFILLS for any discharge medications will be authorized once you are discharged, as it is imperative that you return to your primary care physician (or establish a relationship with a primary care physician if you do not have one) for your post hospital discharge needs so that they can reassess your need for medications and monitor your lab values.  Time coordinating discharge: 35 minutes  SIGNED:  Pamella Pert, MD, PhD 10/06/2023, 8:26 AM

## 2023-10-07 ENCOUNTER — Telehealth: Payer: Self-pay | Admitting: *Deleted

## 2023-10-07 NOTE — Transitions of Care (Post Inpatient/ED Visit) (Signed)
   10/07/2023  Name: Stacey [name] MRN: [id number] DOB: [birth year]  Today's TOC FU Call Status: Today's TOC FU Call Status:: Unsuccessful Call (1st Attempt) Unsuccessful Call (1st Attempt) Date: 10/07/23  Attempted to reach the patient regarding the most recent Inpatient/ED visit.  Follow Up Plan: Additional outreach attempts will be made to reach the patient to complete the Transitions of Care (Post Inpatient/ED visit) call.   Mliss Creed [facility], BSN RN Care Manager/ Transition of Care [place] [phone]

## 2023-10-09 ENCOUNTER — Telehealth: Payer: Self-pay

## 2023-10-09 NOTE — Transitions of Care (Post Inpatient/ED Visit) (Signed)
 10/09/2023  Name: Stacey [name] MRN: [id number] DOB: 1941-01-15  Today's TOC FU Call Status: Today's TOC FU Call Status:: Successful TOC FU Call Completed TOC FU Call Complete Date: 10/09/23 Patient's Name and Date of Birth confirmed.  Transition Care Management Follow-up Telephone Call How have you been since you were released from the hospital?: Better (states that she is better and reports that she continues to be weak.) Any questions or concerns?: No  Items Reviewed: Did you receive and understand the discharge instructions provided?: Yes Medications obtained,verified, and reconciled?: Yes (Medications Reviewed) Any new allergies since your discharge?: No Dietary orders reviewed?: Yes Type of Diet Ordered:: soft Do you have support at home?: Yes People in Home: child(ren), adult Name of Support/Comfort Primary Source: adult sons  Medications Reviewed Today: Medications Reviewed Today     Reviewed by Rumalda Alan PENNER, RN (Registered Nurse) on 10/09/23 at 1200  Med List Status: <None>   Medication Order Taking? Sig Documenting Provider Last Dose Status Informant  acetaminophen  (TYLENOL ) 325 MG tablet [id number] Yes Take 650 mg by mouth every 6 (six) hours as needed for moderate pain or fever. [provider] Taking Active Self, Pharmacy Records  atorvastatin  (LIPITOR) 20 MG tablet [id number] Yes Take 1 tablet (20 mg total) by mouth daily.  Patient taking differently: Take 20 mg by mouth at bedtime.   Lavell Bari LABOR, FNP Taking Active Self, Pharmacy Records  busPIRone  (BUSPAR ) 5 MG tablet [id number] Yes Take 1 tablet (5 mg total) by mouth 3 (three) times daily as needed. Lavell Bari LABOR, FNP Taking Active Self, Pharmacy Records  cetirizine  (ZYRTEC  ALLERGY) 10 MG tablet [id number] Yes Take 1 tablet (10 mg total) by mouth daily. Lavell Bari LABOR, FNP Taking Active Self, Pharmacy Records  fluticasone  (FLONASE ) 50 MCG/ACT nasal spray [id number] Yes Place 2 sprays into both  nostrils daily. Lavell Bari LABOR, FNP Taking Active Self, Pharmacy Records  hydrochlorothiazide  (HYDRODIURIL ) 25 MG tablet [id number] Yes Take 1 tablet by mouth once daily Lavell, Bari LABOR, FNP Taking Active Self, Pharmacy Records  Multiple Vitamin (MULITIVITAMIN WITH MINERALS) TABS [id number] Yes Take 1 tablet by mouth daily. [provider] Taking Active Self, Pharmacy Records  trolamine salicylate (BLUE-EMU HEMP) 10 % cream [id number] Yes Apply 1 Application topically as needed for muscle pain. [provider] Taking Active Self, Pharmacy Records            Home Care and Equipment/Supplies: Were Home Health Services Ordered?: No Any new equipment or medical supplies ordered?: No  Functional Questionnaire: Do you need assistance with bathing/showering or dressing?: No Do you need assistance with meal preparation?: No Do you need assistance with eating?: No Do you have difficulty maintaining continence: No Do you need assistance with getting out of bed/getting out of a chair/moving?: No Do you have difficulty managing or taking your medications?: No  Follow up appointments reviewed: PCP Follow-up appointment confirmed?: No (Reports she will call herself to make an appointment) MD Provider Line Number:[phone] Given: No Specialist Hospital Follow-up appointment confirmed?: NA Do you need transportation to your follow-up appointment?: No Do you understand care options if your condition(s) worsen?: Yes-patient verbalized understanding  SDOH Interventions Today    Flowsheet Row Most Recent Value  SDOH Interventions   Food Insecurity Interventions Intervention Not Indicated  Housing Interventions Intervention Not Indicated  Transportation Interventions Intervention Not Indicated  Utilities Interventions Intervention Not Indicated      Patient reports that she is doing well. Reports very little energy. Reviewed importance of  eating a well balanced diet.  Encouraged patient to eat frequent small meals.   Reviewed TOC program and offered 30 day TOC follow up. Patient declined.  Provided my contact information for patient to call me if needed.  She voiced understanding.    Alan Ee, RN, BSN, CEN [employer]- Transition of Care Team.  [facility] [phone]

## 2023-10-10 ENCOUNTER — Ambulatory Visit: Payer: Self-pay | Admitting: Family

## 2023-10-10 NOTE — Telephone Encounter (Signed)
 Called patient and put her in for Thursday

## 2023-10-10 NOTE — Telephone Encounter (Signed)
 She is currently scheduled on 10/31/2023 for her normal 4 mth check up. Will this work? Stacey [name] doesn't have any openings before this date.

## 2023-10-10 NOTE — Telephone Encounter (Signed)
 Patient called in requesting a follow-up appointment with her provider. She was hospitalized over the weekend for a bowel obstruction and released on Monday. Current PCP did not have availability within 14 days. Offered patient a sooner appointment in the office with a different provider. Patient declined and stated she wanted to see her specific PCP. Please advise. Thank you!  Copied from CRM #[id number]. Topic: Appointments - Appointment Scheduling >> Oct 10, 2023  9:19 AM Rodell CROME wrote: Patient/patient representative is calling to schedule an appointment. Refer to attachments for appointment information.  Reason for Disposition  [1] Follow-up call to recent contact AND [2] information only call, no triage required  Answer Assessment - Initial Assessment Questions 1. REASON FOR CALL or QUESTION: What is your reason for calling today? or How can I best help you? or What question do you have that I can help answer?     Patient stated that she was in the hospital over the weekend for a bowel obstruction. Patient reports that she was released on Monday and was told to make a follow-up appointment with her provider.  Protocols used: Information Only Call - No Triage-A-AH

## 2023-10-16 ENCOUNTER — Ambulatory Visit: Payer: Medicare Other | Admitting: Family

## 2023-10-16 ENCOUNTER — Encounter: Payer: Self-pay | Admitting: Family

## 2023-10-16 VITALS — BP 130/73 | HR 94 | Temp 98.0°F | Ht 63.0 in | Wt 169.4 lb

## 2023-10-16 DIAGNOSIS — Z09 Encounter for follow-up examination after completed treatment for conditions other than malignant neoplasm: Secondary | ICD-10-CM

## 2023-10-16 DIAGNOSIS — R531 Weakness: Secondary | ICD-10-CM | POA: Diagnosis not present

## 2023-10-16 DIAGNOSIS — K56609 Unspecified intestinal obstruction, unspecified as to partial versus complete obstruction: Secondary | ICD-10-CM | POA: Diagnosis not present

## 2023-10-16 DIAGNOSIS — F411 Generalized anxiety disorder: Secondary | ICD-10-CM | POA: Diagnosis not present

## 2023-10-16 MED ORDER — BUSPIRONE HCL 5 MG PO TABS: 5.0000 mg | ORAL_TABLET | Freq: Three times a day (TID) | ORAL | 1 refills | Status: DC | PRN

## 2023-10-16 NOTE — Patient Instructions (Signed)
Bowel Obstruction A bowel obstruction is a blockage in the small or large bowel. The bowel is also called the intestine. It is a long tube that connects the stomach to the anus. When a person eats and drinks, food and fluids go from the mouth to the stomach to the small bowel. This is where most of the nutrients in the food and fluids are absorbed. After the small bowel, material passes through the large bowel for further absorption until any leftover material leaves the body as stool (feces) through the anus during a bowel movement. A bowel obstruction will prevent food and fluids from passing through the bowel as they normally do during digestion. The bowel can become partially or completely blocked. If this condition is not treated, it can be dangerous because the bowel could rupture. What are the causes? Common causes of this condition include: Scar tissue in the body (adhesions) from previous surgery or treatment with high-energy X-rays (radiation). Recent surgery. This may cause the movements of the bowel to slow down and cause food to block the intestine. Inflammatory bowel disease, such as Crohn's disease or diverticulitis. Growths or tumors. A bulging organ or tissue (hernia). Twisting of the bowel (volvulus). A swallowed object (foreign body). Slipping of a part of the bowel into another part (intussusception). What are the signs or symptoms? Symptoms of this condition include: Pain in the abdomen. Depending on the degree of obstruction, pain may be: Mild or severe. Dull cramping or sharp pain. In one area or in the entire abdomen. Nausea and vomiting. Vomit may be greenish or a yellow bile color. Bloating in the abdomen. Constipation. Being unable to pass gas. Frequent belching. Diarrhea. This may occur if the obstruction is partial and runny stool is able to leak around the obstruction. How is this diagnosed? This condition may be diagnosed based on: A physical exam. Your  medical history. Exams to look into the small intestine or the large intestine (endoscopy or colonoscopy). Imaging tests of the abdomen or pelvis, such as X-ray or CT scan. Blood or urine tests. How is this treated? Treatment for this condition depends on the cause and severity of the problem. Treatment may include: Fluids and pain medicines that are given through an IV. Your health care provider may tell you not to eat or drink if you have nausea or vomiting. A clear liquid diet. You may be asked to consume a clear liquid diet for several days. This allows the bowel to rest. Placement of a small tube (nasogastric tube) through the nose, down the throat, and into the stomach. This may relieve pain, discomfort, and nausea by removing blocked air and fluids from the stomach. It can also help the obstruction clear up faster. Surgery. This may be required if other treatments do not work. Surgery may be required for: Bowel obstruction from a hernia. This can be an emergency procedure. Scar tissue that causes frequent or severe obstructions. Follow these instructions at home: Medicines Take over-the-counter and prescription medicines only as told by your health care provider. If you were prescribed an antibiotic medicine, take it as told by your health care provider. Do not stop taking the antibiotic even if you start to feel better. General instructions Follow instructions from your health care provider about eating and drinking restrictions. You may need to avoid solid foods and drink only clear liquids until your condition improves. Return to your normal activities as told by your health care provider. Ask your health care provider what activities   are safe for you. Rest as told by your health care provider. Avoid sitting for a long time without moving. Get up to take short walks every 1-2 hours. This is important to improve blood flow and breathing. Ask for help if you feel weak or unsteady. Keep  all follow-up visits. This is important. How is this prevented? After having a bowel obstruction, you are more likely to have another. You may do the following things to prevent another obstruction: If you have a long-term (chronic) disease, pay attention to your symptoms and contact your health care provider if you have questions or concerns. Avoid becoming constipated. You may need to take these actions to prevent or treat constipation: Drink enough fluid to keep your urine pale yellow. Take over-the-counter or prescription medicines. Eat foods that are high in fiber, such as beans, whole grains, and fresh fruits and vegetables. Limit foods that are high in fat and processed sugars, such as fried or sweet foods. Stay active. Exercise for 30 minutes or more, 5 or more days each week. Ask your health care provider which exercises are safe for you. Avoid stress. Find ways to reduce stress, such as meditation, exercise, or taking time for activities that relax you. Instead of eating three large meals each day, eat three small meals with three small snacks. Work with a Data processing manager to make a healthy meal plan that works for you. Do not use any products that contain nicotine or tobacco. These products include cigarettes, chewing tobacco, and vaping devices, such as e-cigarettes. If you need help quitting, ask your health care provider. Contact a health care provider if: You have a fever. You have chills. Get help right away if: You have increased pain or cramping. You vomit blood. You have uncontrolled vomiting or nausea. You cannot drink fluids because of vomiting or pain. You become confused. You begin feeling very thirsty (dehydrated). You have severe bloating. You feel extremely weak or you faint. Summary A bowel obstruction is a blockage in the small or large bowel. A bowel obstruction will prevent food and fluids from passing through the bowel as they normally do during  digestion. Treatment for this condition depends on the cause and severity of the problem. It may include fluids and pain medicines through an IV, a simple diet, a nasogastric tube, or surgery. Follow instructions from your health care provider about eating restrictions. You may need to avoid solid foods and consume only clear liquids until your condition improves. This information is not intended to replace advice given to you by your health care provider. Make sure you discuss any questions you have with your health care provider. Document Revised: 11/05/2020 Document Reviewed: 11/05/2020 Elsevier Patient Education  2024 [employer].

## 2023-10-16 NOTE — Progress Notes (Signed)
 Subjective:    Patient ID: Stacey [name], female    DOB: [birth year], 83 y.o.   MRN: [id number]  Chief Complaint  Patient presents with   Hospitalization Follow-up   Pt presents to the office today for hospital follow up. She went to the ED on 10/02/23 with abdominal pain and N&V. She was diagnosed with SBO. She was placed NPO and had NG tube place, IV fluids and conservative management. She was discharged on 10/06/23.   She reports she continues to be weak and can not gain her strength back.  Abdominal Pain The current episode started 1 to 4 weeks ago. The problem has been resolved.  Anxiety Presents for follow-up visit. Symptoms include depressed mood and nervous/anxious behavior. Symptoms occur most days. The severity of symptoms is mild.        Review of Systems  Gastrointestinal:  Positive for abdominal pain.  Psychiatric/Behavioral:  The patient is nervous/anxious.   All other systems reviewed and are negative.   Social History   Socioeconomic History   Marital status: Widowed    Spouse name: Not on file   Number of children: 2   Years of education: Not on file   Highest education level: Not on file  Occupational History   Occupation: retired  Tobacco Use   Smoking status: Former    Current packs/day: 0.25    Average packs/day: 0.3 packs/day for 25.0 years (6.3 ttl pk-yrs)    Types: Cigarettes   Smokeless tobacco: Never  Vaping Use   Vaping status: Never Used  Substance and Sexual Activity   Alcohol use: No   Drug use: No   Sexual activity: Never  Other Topics Concern   Not on file  Social History Narrative   She lives alone in [place] - ground level apartment. Her 2 sons live in [place]   Social Drivers of Health   Financial Resource Strain: Low Risk  (12/30/2022)   Overall Financial Resource Strain (CARDIA)    Difficulty of Paying Living Expenses: Not hard at all  Food Insecurity: No Food Insecurity (10/09/2023)   Hunger Vital Sign    Worried  About Running Out of Food in the Last Year: Never true    Ran Out of Food in the Last Year: Never true  Transportation Needs: No Transportation Needs (10/09/2023)   PRAPARE - Administrator, Civil Service (Medical): No    Lack of Transportation (Non-Medical): No  Physical Activity: Inactive (12/30/2022)   Exercise Vital Sign    Days of Exercise per Week: 0 days    Minutes of Exercise per Session: 0 min  Stress: No Stress Concern Present (12/30/2022)   [employer] of Occupational Health - Occupational Stress Questionnaire    Feeling of Stress : Not at all  Social Connections: Socially Isolated (12/30/2022)   Social Connection and Isolation Panel [NHANES]    Frequency of Communication with Friends and Family: More than three times a week    Frequency of Social Gatherings with Friends and Family: Three times a week    Attends Religious Services: Never    Active Member of Clubs or Organizations: No    Attends Banker Meetings: Never    Marital Status: Widowed   Family History  Problem Relation Age of Onset   Heart disease Mother    Stroke Father    Hypertension Sister    Hypertension Brother    Hypertension Sister    Hypertension Sister    Hypertension Brother  Hypertension Brother         Objective:   Physical Exam Vitals reviewed.  Constitutional:      General: She is not in acute distress.    Appearance: She is well-developed. She is obese.  HENT:     Head: Normocephalic and atraumatic.     Right Ear: Tympanic membrane normal.     Left Ear: Tympanic membrane normal.  Eyes:     Pupils: Pupils are equal, round, and reactive to light.  Neck:     Thyroid : No thyromegaly.  Cardiovascular:     Rate and Rhythm: Normal rate and regular rhythm.     Heart sounds: Normal heart sounds. No murmur heard. Pulmonary:     Effort: Pulmonary effort is normal. No respiratory distress.     Breath sounds: Normal breath sounds. No wheezing.  Abdominal:      General: Bowel sounds are normal. There is no distension.     Palpations: Abdomen is soft.     Tenderness: There is no abdominal tenderness.  Musculoskeletal:        General: No tenderness. Normal range of motion.     Cervical back: Normal range of motion and neck supple.  Skin:    General: Skin is warm and dry.  Neurological:     Mental Status: She is alert and oriented to person, place, and time.     Cranial Nerves: No cranial nerve deficit.     Deep Tendon Reflexes: Reflexes are normal and symmetric.  Psychiatric:        Mood and Affect: Mood is anxious. Affect is tearful.        Behavior: Behavior normal.        Thought Content: Thought content normal.        Judgment: Judgment normal.       BP 130/73   Pulse 94   Temp 98 F (36.7 C) (Temporal)   Ht 5' 3 (1.6 m)   Wt 169 lb 6.4 oz (76.8 kg)   BMI 30.01 kg/m      Assessment & Plan:  Stacey [name] comes in today with chief complaint of Hospitalization Follow-up   Diagnosis and orders addressed:  1. SBO (small bowel obstruction) (HCC) (Primary) - CBC with Differential/Platelet - CMP14+EGFR  2. Hospital discharge follow-up - CBC with Differential/Platelet - CMP14+EGFR  3. Weakness - CBC with Differential/Platelet - CMP14+EGFR  4. GAD (generalized anxiety disorder) Buspar  as needed - busPIRone  (BUSPAR ) 5 MG tablet; Take 1 tablet (5 mg total) by mouth 3 (three) times daily as needed.  Dispense: 90 tablet; Refill: 1   Labs pending Hospital notes reviewed  Continue current medications  Keep follow up with specialists  Health Maintenance reviewed Diet and exercise encouraged  No follow-ups on file.    Bari Learn, FNP

## 2023-10-17 LAB — CBC WITH DIFFERENTIAL/PLATELET
Basophils Absolute: 0.1 10*3/uL (ref 0.0–0.2)
Basos: 1 %
EOS (ABSOLUTE): 0.2 10*3/uL (ref 0.0–0.4)
Eos: 2 %
Hematocrit: 37.4 % (ref 34.0–46.6)
Hemoglobin: 12.3 g/dL (ref 11.1–15.9)
Immature Grans (Abs): 0 10*3/uL (ref 0.0–0.1)
Immature Granulocytes: 0 %
Lymphocytes Absolute: 2.8 10*3/uL (ref 0.7–3.1)
Lymphs: 35 %
MCH: 29.9 pg (ref 26.6–33.0)
MCHC: 32.9 g/dL (ref 31.5–35.7)
MCV: 91 fL (ref 79–97)
Monocytes Absolute: 0.5 10*3/uL (ref 0.1–0.9)
Monocytes: 7 %
Neutrophils Absolute: 4.3 10*3/uL (ref 1.4–7.0)
Neutrophils: 55 %
Platelets: 249 10*3/uL (ref 150–450)
RBC: 4.12 x10E6/uL (ref 3.77–5.28)
RDW: 12.5 % (ref 11.7–15.4)
WBC: 7.8 10*3/uL (ref 3.4–10.8)

## 2023-10-17 LAB — CMP14+EGFR
ALT: 12 [IU]/L (ref 0–32)
AST: 16 [IU]/L (ref 0–40)
Albumin: 4.1 g/dL (ref 3.7–4.7)
Alkaline Phosphatase: 62 [IU]/L (ref 44–121)
BUN/Creatinine Ratio: 16 (ref 12–28)
BUN: 14 mg/dL (ref 8–27)
Bilirubin Total: 0.2 mg/dL (ref 0.0–1.2)
CO2: 25 mmol/L (ref 20–29)
Calcium: 10.1 mg/dL (ref 8.7–10.3)
Chloride: 100 mmol/L (ref 96–106)
Creatinine, Ser: 0.89 mg/dL (ref 0.57–1.00)
Globulin, Total: 2.7 g/dL (ref 1.5–4.5)
Glucose: 105 mg/dL — ABNORMAL HIGH (ref 70–99)
Potassium: 3.4 mmol/L — ABNORMAL LOW (ref 3.5–5.2)
Sodium: 143 mmol/L (ref 134–144)
Total Protein: 6.8 g/dL (ref 6.0–8.5)
eGFR: 65 mL/min/{1.73_m2} (ref >59)

## 2023-10-31 ENCOUNTER — Ambulatory Visit: Payer: Medicare Other | Admitting: Family

## 2023-11-21 ENCOUNTER — Other Ambulatory Visit: Payer: Self-pay | Admitting: Family

## 2023-11-21 DIAGNOSIS — I10 Essential (primary) hypertension: Secondary | ICD-10-CM

## 2023-12-11 ENCOUNTER — Telehealth: Payer: Medicare Other | Admitting: Family

## 2023-12-11 ENCOUNTER — Encounter: Payer: Self-pay | Admitting: Family

## 2023-12-11 NOTE — Progress Notes (Signed)
 PT does not have a smart phone or computer at home. Will cancel this visit and reschedule in office.   Jannifer Rodney, FNP

## 2024-01-01 ENCOUNTER — Ambulatory Visit: Payer: Medicare Other

## 2024-01-01 VITALS — BP 130/73 | HR 94 | Ht 63.0 in | Wt 169.0 lb

## 2024-01-01 DIAGNOSIS — Z0000 Encounter for general adult medical examination without abnormal findings: Secondary | ICD-10-CM | POA: Diagnosis not present

## 2024-01-01 NOTE — Progress Notes (Addendum)
 Subjective:   Stacey [name] is a 83 y.o. who presents for a Medicare Wellness preventive visit.  Visit Complete: Virtual I connected with  [name] on 01/01/24 by a audio enabled telemedicine application and verified that I am speaking with the correct person using two identifiers.  Patient Location: Home  Provider Location: Home Office  I discussed the limitations of evaluation and management by telemedicine. The patient expressed understanding and agreed to proceed.  Vital Signs: Because this visit was a virtual/telehealth visit, some criteria may be missing or patient reported. Any vitals not documented were not able to be obtained and vitals that have been documented are patient reported.  VideoDeclined- This patient declined Librarian, academic. Therefore the visit was completed with audio only.  Persons Participating in Visit: Patient.  AWV Questionnaire: No: Patient Medicare AWV questionnaire was not completed prior to this visit.  Cardiac Risk Factors include: advanced age (>58men, >52 women);dyslipidemia;hypertension     Objective:    Today's Vitals   01/01/24 0920 01/01/24 0923  BP: 130/73   Pulse: 94   Weight: 169 lb (76.7 kg)   Height: 5\' 3"  (1.6 m)   PainSc:  8    Body mass index is 29.94 kg/m.     01/01/2024    9:33 AM 10/03/2023    3:25 PM 10/02/2023    9:54 PM 12/30/2022    7:12 PM 07/17/2021    4:57 PM 07/14/2021   11:17 AM 07/04/2021    6:59 AM  Advanced Directives  Does Patient Have a Medical Advance Directive? No  No Yes No No No  Type of Theme park manager;Living will     Does patient want to make changes to medical advance directive?    No - Patient declined     Copy of Healthcare Power of Attorney in Chart?    No - copy requested     Would patient like information on creating a medical advance directive?  No - Patient declined    No - Patient declined No - Patient declined     Current Medications (verified) Outpatient Encounter Medications as of 01/01/2024  Medication Sig   acetaminophen (TYLENOL) 325 MG tablet Take 650 mg by mouth every 6 (six) hours as needed for moderate pain or fever.   atorvastatin (LIPITOR) 20 MG tablet Take 1 tablet (20 mg total) by mouth daily. (Patient taking differently: Take 20 mg by mouth at bedtime.)   busPIRone (BUSPAR) 5 MG tablet Take 1 tablet (5 mg total) by mouth 3 (three) times daily as needed.   cetirizine (ZYRTEC ALLERGY) 10 MG tablet Take 1 tablet (10 mg total) by mouth daily.   fluticasone (FLONASE) 50 MCG/ACT nasal spray Place 2 sprays into both nostrils daily.   hydrochlorothiazide (HYDRODIURIL) 25 MG tablet Take 1 tablet by mouth once daily   Multiple Vitamin (MULITIVITAMIN WITH MINERALS) TABS Take 1 tablet by mouth daily.   trolamine salicylate (BLUE-EMU HEMP) 10 % cream Apply 1 Application topically as needed for muscle pain.   No facility-administered encounter medications on file as of 01/01/2024.    Allergies (verified) Clindamycin/lincomycin, Colchicine, Other, Phenergan [promethazine hcl], and Statins   History: Past Medical History:  Diagnosis Date   Arthritis    "knees" (05/27/2013)   Bowel obstruction (HCC)    Chronic lower back pain    "bulging discs" (05/27/2013)   GERD (gastroesophageal reflux disease)    Headache(784.0)    "maybe once/wk' (  05/27/2013)   Hyperlipidemia    Hypertension    Past Surgical History:  Procedure Laterality Date   ABDOMINAL HYSTERECTOMY  1980's   APPENDECTOMY  1980's   BILATERAL OOPHORECTOMY Bilateral 2000   COLON SURGERY  10/09/2003   small bowel resection/notes 10/09/2003 (05/27/2013)   INGUINAL HERNIA REPAIR  10/09/2003; 2006   Laparotomy with extensive lysis of adhesions,/notes 10/09/2003 (05/27/2013);    Family History  Problem Relation Age of Onset   Heart disease Mother    Stroke Father    Hypertension Sister    Hypertension Brother    Hypertension Sister     Hypertension Sister    Hypertension Brother    Hypertension Brother    Social History   Socioeconomic History   Marital status: Widowed    Spouse name: Not on file   Number of children: 2   Years of education: Not on file   Highest education level: Not on file  Occupational History   Occupation: retired  Tobacco Use   Smoking status: Former    Current packs/day: 0.25    Average packs/day: 0.3 packs/day for 25.0 years (6.3 ttl pk-yrs)    Types: Cigarettes   Smokeless tobacco: Never  Vaping Use   Vaping status: Never Used  Substance and Sexual Activity   Alcohol use: No   Drug use: No   Sexual activity: Never  Other Topics Concern   Not on file  Social History Narrative   She lives alone in [place] - ground level apartment. Her 2 sons live in [place]   Social Drivers of Health   Financial Resource Strain: Low Risk  (01/01/2024)   Overall Financial Resource Strain (CARDIA)    Difficulty of Paying Living Expenses: Not hard at all  Food Insecurity: No Food Insecurity (01/01/2024)   Hunger Vital Sign    Worried About Running Out of Food in the Last Year: Never true    Ran Out of Food in the Last Year: Never true  Transportation Needs: No Transportation Needs (10/09/2023)   PRAPARE - Administrator, Civil Service (Medical): No    Lack of Transportation (Non-Medical): No  Physical Activity: Sufficiently Active (01/01/2024)   Exercise Vital Sign    Days of Exercise per Week: 7 days    Minutes of Exercise per Session: 30 min  Stress: No Stress Concern Present (01/01/2024)   [employer] of Occupational Health - Occupational Stress Questionnaire    Feeling of Stress : Only a little  Social Connections: Socially Isolated (01/01/2024)   Social Connection and Isolation Panel [NHANES]    Frequency of Communication with Friends and Family: Once a week    Frequency of Social Gatherings with Friends and Family: Once a week    Attends Religious Services: Never    Automotive engineer or Organizations: No    Attends Banker Meetings: Never    Marital Status: Widowed    Tobacco Counseling Counseling given: Yes    Clinical Intake:  Pre-visit preparation completed: Yes  Pain : 0-10 (r-hand due arthritisis / both knees) Pain Score: 8  Pain Type: Chronic pain (arthritisis) Pain Location:  (r-hand/both knees) Pain Relieving Factors: per pt no Effect of Pain on Daily Activities: no  Pain Relieving Factors: per pt no  BMI - recorded: 29.94 Nutritional Status: BMI 25 -29 Overweight Nutritional Risks: None Diabetes: No  Lab Results  Component Value Date   HGBA1C 6.0 (H) 05/22/2023   HGBA1C 6.2 (H) 02/28/2023   HGBA1C  6.1 (H) 11/04/2022     How often do you need to have someone help you when you read instructions, pamphlets, or other written materials from your doctor or pharmacy?: 1 - Never  Interpreter Needed?: No  Information entered by :: Alia T/cma   Activities of Daily Living     01/01/2024    9:30 AM 10/04/2023    2:00 PM  In your present state of health, do you have any difficulty performing the following activities:  Hearing? 0   Vision? 0   Difficulty concentrating or making decisions? 0   Walking or climbing stairs? 0   Dressing or bathing? 0   Doing errands, shopping? 0 0  Preparing Food and eating ? N   Using the Toilet? N   In the past six months, have you accidently leaked urine? N   Do you have problems with loss of bowel control? N   Managing your Medications? N   Managing your Finances? N   Housekeeping or managing your Housekeeping? N     Patient Care Team: [name], FNP as PCP - General (Nurse Practitioner)  Indicate any recent Medical Services you may have received from other than [facility] providers in the past year (date may be approximate).     Assessment:   This is a routine wellness examination for Jolyssa.  Hearing/Vision screen Hearing Screening - Comments:: Pt denies hearing  def Vision Screening - Comments:: Pt denies vision def, no current eye appt at the moment   Goals Addressed             This Visit's Progress    Patient Stated       Continue to get more exercise with bicycle       Depression Screen     01/01/2024    9:28 AM 02/28/2023   11:32 AM 12/30/2022    7:10 PM 11/04/2022   10:31 AM 06/03/2022    9:36 AM 03/05/2022   10:05 AM 12/04/2021   10:22 AM  PHQ 2/9 Scores  PHQ - 2 Score 0 0 0 0 0 0 0  PHQ- 9 Score 0  0 0 0 0     Fall Risk     01/01/2024    9:28 AM 02/28/2023   11:32 AM 12/30/2022    5:28 PM 11/04/2022   10:32 AM 06/03/2022    9:36 AM  Fall Risk   Falls in the past year? 0 1 1  0  Number falls in past yr: 0 0 0 0   Injury with Fall? 0 0 0 0   Risk for fall due to : No Fall Risks Impaired balance/gait No Fall Risks No Fall Risks   Follow up Falls prevention discussed;Falls evaluation completed Falls evaluation completed;Education provided Falls prevention discussed;Education provided;Falls evaluation completed Falls evaluation completed Falls evaluation completed    MEDICARE RISK AT HOME:  Medicare Risk at Home Any stairs in or around the home?: No If so, are there any without handrails?: No Home free of loose throw rugs in walkways, pet beds, electrical cords, etc?: Yes Adequate lighting in your home to reduce risk of falls?: Yes Life alert?: No Use of a cane, walker or w/c?: No Grab bars in the bathroom?: Yes Shower chair or bench in shower?: No Elevated toilet seat or a handicapped toilet?: Yes  TIMED UP AND GO:  Was the test performed?  No  Cognitive Function: 6CIT completed        01/01/2024  9:36 AM 12/30/2022    7:15 PM 05/10/2021    4:02 PM  6CIT Screen  What Year? 0 points 0 points 0 points  What month? 0 points 0 points 0 points  What time? 0 points 0 points 0 points  Count back from 20 0 points 0 points 0 points  Months in reverse 0 points 0 points 0 points  Repeat phrase 6 points 0 points 0 points   Total Score 6 points 0 points 0 points    Immunizations Immunization History  Administered Date(s) Administered   Fluad Quad(high Dose 65+) 07/08/2019, 07/07/2022   Influenza, High Dose Seasonal PF 07/02/2016, 07/29/2017   Influenza, Seasonal, Injecte, Preservative Fre 08/22/2023   Influenza,inj,Quad PF,6+ Mos 07/04/2015   Influenza-Unspecified 07/07/2013, 07/14/2014, 07/26/2018, 08/23/2021   Moderna Sars-Covid-2 Vaccination 01/05/2020, 01/31/2020, 08/22/2020   Pneumococcal Conjugate-13 07/04/2015   Pneumococcal Polysaccharide-23 02/06/2017    Screening Tests Health Maintenance  Topic Date Due   Zoster Vaccines- Shingrix (1 of 2) 01/13/2024 (Originally 06/15/1992)   DEXA SCAN  06/30/2024 (Originally 06/16/2007)   DTaP/Tdap/Td (1 - Tdap) 10/14/2024 (Originally 06/15/1960)   COVID-19 Vaccine (4 - 2024-25 season) 01/16/2025 (Originally 06/08/2023)   Medicare Annual Wellness (AWV)  12/31/2024   Pneumonia Vaccine 48+ Years old  Completed   INFLUENZA VACCINE  Completed   HPV VACCINES  Aged Out   Hepatitis C Screening  Discontinued    Health Maintenance  There are no preventive care reminders to display for this patient.  Health Maintenance Items Addressed: See Nurse Notes  Additional Screening:  Vision Screening: Recommended annual ophthalmology exams for early detection of glaucoma and other disorders of the eye.  Dental Screening: Recommended annual dental exams for proper oral hygiene  Community Resource Referral / Chronic Care Management: CRR required this visit?  No   CCM required this visit?  No     Plan:     I have personally reviewed and noted the following in the patient's chart:   Medical and social history Use of alcohol, tobacco or illicit drugs  Current medications and supplements including opioid prescriptions. Patient is not currently taking opioid prescriptions. Functional ability and status Nutritional status Physical activity Advanced  directives List of other physicians Hospitalizations, surgeries, and ER visits in previous 12 months Vitals Screenings to include cognitive, depression, and falls Referrals and appointments  In addition, I have reviewed and discussed with patient certain preventive protocols, quality metrics, and best practice recommendations. A written personalized care plan for preventive services as well as general preventive health recommendations were provided to patient.     Arta Silence, CMA   01/01/2024   After Visit Summary: (MyChart) Due to this being a telephonic visit, the after visit summary with patients personalized plan was offered to patient via MyChart   Notes:  Pt was encouraged to get her missed vaccines, Tetanus, Shingles, and to get her Dexa Scan done

## 2024-01-01 NOTE — Patient Instructions (Signed)
 Ms. Weilbacher , Thank you for taking time to come for your Medicare Wellness Visit. I appreciate your ongoing commitment to your health goals. Please review the following plan we discussed and let me know if I can assist you in the future.   Referrals/Orders/Follow-Ups/Clinician Recommendations: Keep up the good work on maintaining your health.  This is a list of the screening recommended for you and due dates:  Health Maintenance  Topic Date Due   Zoster (Shingles) Vaccine (1 of 2) 01/13/2024*   DEXA scan (bone density measurement)  06/30/2024*   DTaP/Tdap/Td vaccine (1 - Tdap) 10/14/2024*   COVID-19 Vaccine (4 - 2024-25 season) 01/16/2025*   Medicare Annual Wellness Visit  12/31/2024   Pneumonia Vaccine  Completed   Flu Shot  Completed   HPV Vaccine  Aged Out   Hepatitis C Screening  Discontinued  *Topic was postponed. The date shown is not the original due date.    Advanced directives: (Declined) Advance directive discussed with you today. Even though you declined this today, please call our office should you change your mind, and we can give you the proper paperwork for you to fill out.  Next Medicare Annual Wellness Visit scheduled for next year: Yes

## 2024-01-06 ENCOUNTER — Other Ambulatory Visit: Payer: Self-pay | Admitting: Family

## 2024-01-06 DIAGNOSIS — J301 Allergic rhinitis due to pollen: Secondary | ICD-10-CM

## 2024-02-19 ENCOUNTER — Other Ambulatory Visit: Payer: Self-pay | Admitting: Family

## 2024-02-19 DIAGNOSIS — I10 Essential (primary) hypertension: Secondary | ICD-10-CM

## 2024-04-12 ENCOUNTER — Other Ambulatory Visit: Payer: Self-pay | Admitting: Family

## 2024-04-12 DIAGNOSIS — F411 Generalized anxiety disorder: Secondary | ICD-10-CM

## 2024-05-24 ENCOUNTER — Other Ambulatory Visit: Payer: Self-pay | Admitting: Family

## 2024-05-24 DIAGNOSIS — I10 Essential (primary) hypertension: Secondary | ICD-10-CM

## 2024-06-08 ENCOUNTER — Ambulatory Visit (INDEPENDENT_AMBULATORY_CARE_PROVIDER_SITE_OTHER): Admitting: Family

## 2024-06-08 ENCOUNTER — Encounter: Payer: Self-pay | Admitting: Family

## 2024-06-08 VITALS — BP 115/69 | HR 93 | Ht 63.0 in | Wt 172.0 lb

## 2024-06-08 DIAGNOSIS — G8929 Other chronic pain: Secondary | ICD-10-CM | POA: Diagnosis not present

## 2024-06-08 DIAGNOSIS — M5441 Lumbago with sciatica, right side: Secondary | ICD-10-CM

## 2024-06-08 DIAGNOSIS — E785 Hyperlipidemia, unspecified: Secondary | ICD-10-CM

## 2024-06-08 DIAGNOSIS — F411 Generalized anxiety disorder: Secondary | ICD-10-CM | POA: Diagnosis not present

## 2024-06-08 DIAGNOSIS — J0110 Acute frontal sinusitis, unspecified: Secondary | ICD-10-CM | POA: Diagnosis not present

## 2024-06-08 DIAGNOSIS — M150 Primary generalized (osteo)arthritis: Secondary | ICD-10-CM

## 2024-06-08 DIAGNOSIS — E669 Obesity, unspecified: Secondary | ICD-10-CM

## 2024-06-08 DIAGNOSIS — R7303 Prediabetes: Secondary | ICD-10-CM | POA: Diagnosis not present

## 2024-06-08 DIAGNOSIS — E876 Hypokalemia: Secondary | ICD-10-CM | POA: Diagnosis not present

## 2024-06-08 DIAGNOSIS — K2100 Gastro-esophageal reflux disease with esophagitis, without bleeding: Secondary | ICD-10-CM | POA: Diagnosis not present

## 2024-06-08 DIAGNOSIS — E559 Vitamin D deficiency, unspecified: Secondary | ICD-10-CM

## 2024-06-08 DIAGNOSIS — I10 Essential (primary) hypertension: Secondary | ICD-10-CM | POA: Diagnosis not present

## 2024-06-08 DIAGNOSIS — J301 Allergic rhinitis due to pollen: Secondary | ICD-10-CM

## 2024-06-08 LAB — LIPID PANEL

## 2024-06-08 LAB — BAYER DCA HB A1C WAIVED: HB A1C (BAYER DCA - WAIVED): 5.7 % — ABNORMAL HIGH (ref 4.8–5.6)

## 2024-06-08 MED ORDER — AMOXICILLIN-POT CLAVULANATE 875-125 MG PO TABS: 1.0000 | ORAL_TABLET | Freq: Two times a day (BID) | ORAL | 0 refills | Status: DC

## 2024-06-08 MED ORDER — CETIRIZINE HCL 10 MG PO TABS: 10.0000 mg | ORAL_TABLET | Freq: Every day | ORAL | 4 refills | Status: AC

## 2024-06-08 MED ORDER — BUSPIRONE HCL 5 MG PO TABS: 5.0000 mg | ORAL_TABLET | Freq: Three times a day (TID) | ORAL | 3 refills | Status: AC | PRN

## 2024-06-08 MED ORDER — ATORVASTATIN CALCIUM 20 MG PO TABS: 20.0000 mg | ORAL_TABLET | Freq: Every day | ORAL | 3 refills | Status: AC

## 2024-06-08 MED ORDER — HYDROCHLOROTHIAZIDE 25 MG PO TABS: 25.0000 mg | ORAL_TABLET | Freq: Every day | ORAL | 1 refills | Status: DC

## 2024-06-08 MED ORDER — FLUCONAZOLE 150 MG PO TABS: 150.0000 mg | ORAL_TABLET | ORAL | 0 refills | Status: DC | PRN

## 2024-06-08 MED ORDER — FLUTICASONE PROPIONATE 50 MCG/ACT NA SUSP
2.0000 | Freq: Every day | NASAL | 6 refills | Status: AC
Start: 2024-06-08 — End: ?

## 2024-06-08 NOTE — Progress Notes (Signed)
 Subjective:    Patient ID: Stacey [name], female    DOB: 1941/01/14, 83 y.o.   MRN: [id number]  Chief Complaint  Patient presents with   Medical Management of Chronic Issues   Sinusitis    Pt presents to the office today for chronic follow up. She states she her daughter-in-law passed away 12/03/2021.  Hypertension This is a chronic problem. The current episode started more than 1 year ago. The problem has been resolved since onset. The problem is controlled. Associated symptoms include anxiety, headaches, malaise/fatigue and peripheral edema. Pertinent negatives include no blurred vision or shortness of breath. Risk factors for coronary artery disease include dyslipidemia, obesity and sedentary lifestyle. The current treatment provides moderate improvement.  Gastroesophageal Reflux She complains of belching, coughing, heartburn and a hoarse voice. She reports no sore throat. This is a chronic problem. The current episode started more than 1 year ago. The problem occurs occasionally. The symptoms are aggravated by certain foods. She has tried a PPI for the symptoms. The treatment provided moderate relief.  Arthritis Presents for follow-up visit. She complains of pain and stiffness. The symptoms have been stable. Affected locations include the left knee, right knee, left MCP and right MCP. Her pain is at a severity of 10/10 (at times).  Hyperlipidemia This is a chronic problem. The current episode started more than 1 year ago. The problem is controlled. Recent lipid tests were reviewed and are normal. Exacerbating diseases include obesity. Pertinent negatives include no shortness of breath. Current antihyperlipidemic treatment includes statins. The current treatment provides moderate improvement of lipids. Risk factors for coronary artery disease include dyslipidemia, hypertension, a sedentary lifestyle and post-menopausal.  Back Pain This is a chronic problem. The current episode started more  than 1 year ago. The problem occurs intermittently. The problem has been waxing and waning since onset. The pain is present in the lumbar spine. The quality of the pain is described as aching. The pain is at a severity of 4/10. The pain is moderate. Associated symptoms include headaches. The treatment provided mild relief.  Anxiety Presents for follow-up visit. Symptoms include excessive worry, nervous/anxious behavior and restlessness. Patient reports no shortness of breath. Symptoms occur occasionally. The severity of symptoms is mild.    Diabetes Diabetes type: prediabetes. Hypoglycemia symptoms include headaches and nervousness/anxiousness. Pertinent negatives for diabetes include no blurred vision and no foot paresthesias. Risk factors for coronary artery disease include dyslipidemia, sedentary lifestyle and post-menopausal. She is following a generally healthy diet. (Does not check glucose at home)  Sinusitis This is a new problem. The current episode started 1 to 4 weeks ago. The problem has been gradually worsening since onset. There has been no fever. Her pain is at a severity of 8/10. The pain is moderate. Associated symptoms include coughing, headaches, a hoarse voice, sinus pressure and sneezing. Pertinent negatives include no congestion, ear pain, shortness of breath or sore throat. Past treatments include acetaminophen  (zyrtec  and flonase ). The treatment provided mild relief.      Review of Systems  Constitutional:  Positive for malaise/fatigue.  HENT:  Positive for hoarse voice, sinus pressure and sneezing. Negative for congestion, ear pain and sore throat.   Eyes:  Negative for blurred vision.  Respiratory:  Positive for cough. Negative for shortness of breath.   Gastrointestinal:  Positive for heartburn.  Musculoskeletal:  Positive for back pain and stiffness.  Neurological:  Positive for headaches.  Psychiatric/Behavioral:  The patient is nervous/anxious.   All other systems  reviewed  and are negative.      Objective:   Physical Exam Vitals reviewed.  Constitutional:      General: She is not in acute distress.    Appearance: She is well-developed. She is obese.  HENT:     Head: Normocephalic and atraumatic.     Right Ear: Tympanic membrane normal.     Left Ear: Tympanic membrane normal.     Nose:     Right Sinus: Frontal sinus tenderness present.     Left Sinus: Frontal sinus tenderness present.  Eyes:     Pupils: Pupils are equal, round, and reactive to light.  Neck:     Thyroid : No thyromegaly.  Cardiovascular:     Rate and Rhythm: Normal rate and regular rhythm.     Heart sounds: Normal heart sounds. No murmur heard. Pulmonary:     Effort: Pulmonary effort is normal. No respiratory distress.     Breath sounds: Normal breath sounds. No wheezing.  Abdominal:     General: Bowel sounds are normal. There is no distension.     Palpations: Abdomen is soft.     Tenderness: There is no abdominal tenderness.  Musculoskeletal:        General: No tenderness. Normal range of motion.     Cervical back: Normal range of motion and neck supple.     Right lower leg: Edema (2+) present.     Left lower leg: Edema (2+) present.  Skin:    General: Skin is warm and dry.  Neurological:     Mental Status: She is alert and oriented to person, place, and time.     Cranial Nerves: No cranial nerve deficit.     Deep Tendon Reflexes: Reflexes are normal and symmetric.  Psychiatric:        Behavior: Behavior normal.        Thought Content: Thought content normal.        Judgment: Judgment normal.       BP 115/69   Pulse 93   Ht 5' 3 (1.6 m)   Wt 172 lb (78 kg)   SpO2 95%   BMI 30.47 kg/m      Assessment & Plan:  Stacey [name] comes in today with chief complaint of Medical Management of Chronic Issues and Sinusitis   Diagnosis and orders addressed:  1. Hyperlipidemia, unspecified hyperlipidemia type - atorvastatin  (LIPITOR) 20 MG tablet; Take 1  tablet (20 mg total) by mouth at bedtime.  Dispense: 90 tablet; Refill: 3 - CMP14+EGFR - CBC with Differential/Platelet - Lipid panel  2. GAD (generalized anxiety disorder) - busPIRone  (BUSPAR ) 5 MG tablet; Take 1 tablet (5 mg total) by mouth 3 (three) times daily as needed.  Dispense: 90 tablet; Refill: 3 - CMP14+EGFR - CBC with Differential/Platelet  3. Primary hypertension (Primary) - hydrochlorothiazide  (HYDRODIURIL ) 25 MG tablet; Take 1 tablet (25 mg total) by mouth daily.  Dispense: 90 tablet; Refill: 1 - CMP14+EGFR - CBC with Differential/Platelet  4. Hypokalemia - CMP14+EGFR - CBC with Differential/Platelet  5. Gastroesophageal reflux disease with esophagitis, unspecified whether hemorrhage  - CMP14+EGFR - CBC with Differential/Platelet  6. Vitamin D  deficiency - CMP14+EGFR - CBC with Differential/Platelet  7. Obesity (BMI 30-39.9)  - CMP14+EGFR - CBC with Differential/Platelet  8. Primary osteoarthritis involving multiple joints - CMP14+EGFR - CBC with Differential/Platelet  9. Chronic bilateral low back pain with right-sided sciatica - CMP14+EGFR - CBC with Differential/Platelet  10. Prediabetes - CMP14+EGFR - CBC with Differential/Platelet - Bayer DCA Hb A1c  Waived  11. Acute non-recurrent frontal sinusitis - Take meds as prescribed - Use a cool mist humidifier  -Use saline nose sprays frequently -Force fluids -For any cough or congestion  Use plain Mucinex- regular strength or max strength is fine -For fever or aces or pains- take tylenol  or ibuprofen. -Throat lozenges if help -Follow up if symptoms worsen or do not improve  - CMP14+EGFR - CBC with Differential/Platelet - amoxicillin -clavulanate (AUGMENTIN ) 875-125 MG tablet; Take 1 tablet by mouth 2 (two) times daily.  Dispense: 14 tablet; Refill: 0  12. Allergic rhinitis due to pollen, unspecified seasonality - cetirizine  (EQ ALLERGY RELIEF, CETIRIZINE ,) 10 MG tablet; Take 1 tablet (10 mg  total) by mouth daily.  Dispense: 90 tablet; Refill: 4 - fluticasone  (FLONASE ) 50 MCG/ACT nasal spray; Place 2 sprays into both nostrils daily.  Dispense: 16 g; Refill: 6    Labs reviewed last month Continue current medications  Health Maintenance reviewed Diet and exercise encouraged  Follow up plan: 4 months    Bari Learn, FNP

## 2024-06-08 NOTE — Patient Instructions (Signed)

## 2024-06-08 NOTE — Addendum Note (Signed)
 Addended by: LAVELL LYE A on: 06/08/2024 12:12 PM   Modules accepted: Orders

## 2024-06-09 LAB — CMP14+EGFR
ALT: 11 IU/L (ref 0–32)
AST: 19 IU/L (ref 0–40)
Albumin: 4.4 g/dL (ref 3.7–4.7)
Alkaline Phosphatase: 70 IU/L (ref 44–121)
BUN/Creatinine Ratio: 20 (ref 12–28)
BUN: 19 mg/dL (ref 8–27)
Bilirubin Total: 0.2 mg/dL (ref 0.0–1.2)
CO2: 24 mmol/L (ref 20–29)
Calcium: 9.7 mg/dL (ref 8.7–10.3)
Chloride: 99 mmol/L (ref 96–106)
Creatinine, Ser: 0.96 mg/dL (ref 0.57–1.00)
Globulin, Total: 2.4 g/dL (ref 1.5–4.5)
Glucose: 106 mg/dL — AB (ref 70–99)
Potassium: 4.2 mmol/L (ref 3.5–5.2)
Sodium: 141 mmol/L (ref 134–144)
Total Protein: 6.8 g/dL (ref 6.0–8.5)
eGFR: 59 mL/min/1.73 — AB (ref >59)

## 2024-06-09 LAB — CBC WITH DIFFERENTIAL/PLATELET
Basophils Absolute: 0.1 x10E3/uL (ref 0.0–0.2)
Basos: 1 %
EOS (ABSOLUTE): 0.1 x10E3/uL (ref 0.0–0.4)
Eos: 1 %
Hematocrit: 37.5 % (ref 34.0–46.6)
Hemoglobin: 12.3 g/dL (ref 11.1–15.9)
Immature Grans (Abs): 0 x10E3/uL (ref 0.0–0.1)
Immature Granulocytes: 0 %
Lymphocytes Absolute: 1.7 x10E3/uL (ref 0.7–3.1)
Lymphs: 24 %
MCH: 30.1 pg (ref 26.6–33.0)
MCHC: 32.8 g/dL (ref 31.5–35.7)
MCV: 92 fL (ref 79–97)
Monocytes Absolute: 0.4 x10E3/uL (ref 0.1–0.9)
Monocytes: 5 %
Neutrophils Absolute: 4.9 x10E3/uL (ref 1.4–7.0)
Neutrophils: 69 %
Platelets: 224 x10E3/uL (ref 150–450)
RBC: 4.08 x10E6/uL (ref 3.77–5.28)
RDW: 12.8 % (ref 11.7–15.4)
WBC: 7.1 x10E3/uL (ref 3.4–10.8)

## 2024-06-09 LAB — LIPID PANEL
Cholesterol, Total: 156 mg/dL (ref 100–199)
HDL: 59 mg/dL (ref >39)
LDL CALC COMMENT:: 2.6 ratio (ref 0.0–4.4)
LDL Chol Calc (NIH): 79 mg/dL (ref 0–99)
Triglycerides: 97 mg/dL (ref 0–149)
VLDL Cholesterol Cal: 18 mg/dL (ref 5–40)

## 2024-06-11 ENCOUNTER — Ambulatory Visit: Payer: Self-pay | Admitting: Family

## 2024-07-28 ENCOUNTER — Encounter: Payer: Self-pay | Admitting: *Deleted

## 2024-08-06 DIAGNOSIS — Z23 Encounter for immunization: Secondary | ICD-10-CM | POA: Diagnosis not present

## 2024-09-06 ENCOUNTER — Ambulatory Visit: Payer: Self-pay

## 2024-09-06 NOTE — Telephone Encounter (Signed)
Fyi noted.

## 2024-09-06 NOTE — Telephone Encounter (Signed)
 FYI Only or Action Required?: Action required by provider: update on patient condition.  Patient was last seen in primary care on 06/08/2024 by Lavell Bari LABOR, FNP.  Called Nurse Triage reporting Back Pain.  Symptoms began several days ago.  Interventions attempted: OTC medications: Tylenol .  Symptoms are: unchanged.  Triage Disposition: Go to ED Now (Notify PCP)  Patient/caregiver understands and will follow disposition?: Yes      Copied from CRM [id number]. Topic: Clinical - Red Word Triage >> Sep 06, 2024  9:28 AM Graeme ORN wrote: Red Word that prompted transfer to Nurse Triage: Down on back - can't get up - can hardly move - pain very bad. Request muscle relaxer Reason for Disposition  [1] SEVERE back pain (e.g., excruciating) AND [2] sudden onset AND [3] age > 60 years  Answer Assessment - Initial Assessment Questions This RN recommends pt goes to ED. Pt refused stating there is nothing they can do for her there and she has dealt with this before. This RN educated pt on why she should be seen in ED today. This RN notified [facility] of pt refusal of ED disposition. Pt would like a call back at [phone].  Bulging disc in back Onset: last Thurs Pain level 10/10 Constant  Protocols used: Back Pain-A-AH

## 2024-09-06 NOTE — Telephone Encounter (Signed)
 Patient declines making an appointment with Bari Learn for now. She will give it a few days and reevaluate

## 2024-10-08 ENCOUNTER — Ambulatory Visit: Admitting: Family

## 2024-10-18 ENCOUNTER — Ambulatory Visit: Payer: Self-pay | Admitting: Family

## 2024-10-18 ENCOUNTER — Encounter: Payer: Self-pay | Admitting: Family

## 2024-10-18 VITALS — BP 131/81 | HR 90 | Temp 97.8°F | Ht 63.0 in | Wt 169.4 lb

## 2024-10-18 DIAGNOSIS — E669 Obesity, unspecified: Secondary | ICD-10-CM

## 2024-10-18 DIAGNOSIS — M170 Bilateral primary osteoarthritis of knee: Secondary | ICD-10-CM | POA: Diagnosis not present

## 2024-10-18 DIAGNOSIS — I10 Essential (primary) hypertension: Secondary | ICD-10-CM

## 2024-10-18 DIAGNOSIS — K2100 Gastro-esophageal reflux disease with esophagitis, without bleeding: Secondary | ICD-10-CM

## 2024-10-18 DIAGNOSIS — E559 Vitamin D deficiency, unspecified: Secondary | ICD-10-CM

## 2024-10-18 DIAGNOSIS — E66811 Obesity, class 1: Secondary | ICD-10-CM | POA: Diagnosis not present

## 2024-10-18 DIAGNOSIS — R7303 Prediabetes: Secondary | ICD-10-CM

## 2024-10-18 DIAGNOSIS — M5441 Lumbago with sciatica, right side: Secondary | ICD-10-CM | POA: Diagnosis not present

## 2024-10-18 DIAGNOSIS — G8929 Other chronic pain: Secondary | ICD-10-CM

## 2024-10-18 DIAGNOSIS — F411 Generalized anxiety disorder: Secondary | ICD-10-CM

## 2024-10-18 DIAGNOSIS — E785 Hyperlipidemia, unspecified: Secondary | ICD-10-CM

## 2024-10-18 DIAGNOSIS — M150 Primary generalized (osteo)arthritis: Secondary | ICD-10-CM

## 2024-10-18 MED ORDER — HYDROCHLOROTHIAZIDE 25 MG PO TABS: 25.0000 mg | ORAL_TABLET | Freq: Every day | ORAL | 1 refills | Status: AC

## 2024-10-18 MED ORDER — METHYLPREDNISOLONE ACETATE 80 MG/ML IJ SUSP: 80.0000 mg | Freq: Once | INTRAMUSCULAR | Status: DC

## 2024-10-18 MED ORDER — METHYLPREDNISOLONE ACETATE 80 MG/ML IJ SUSP
80.0000 mg | Freq: Once | INTRAMUSCULAR | Status: AC
  Administered 2024-10-21: 80 mg via INTRA_ARTICULAR

## 2024-10-18 MED ORDER — BUPIVACAINE HCL 0.25 % IJ SOLN
1.0000 mL | Freq: Once | INTRAMUSCULAR | Status: AC
  Administered 2024-10-21: 1 mL via INTRA_ARTICULAR

## 2024-10-18 NOTE — Progress Notes (Signed)
 "  Subjective:    Patient ID: Stacey [name], female    DOB: [birth year], 84 y.o.   MRN: [id number]  Chief Complaint  Patient presents with   Medical Management of Chronic Issues    Pt presents to the office today for chronic follow up.   She states she her daughter-in-law passed away 11/22/2021.  Hypertension This is a chronic problem. The current episode started more than 1 year ago. The problem has been resolved since onset. The problem is controlled. Associated symptoms include anxiety, malaise/fatigue and peripheral edema. Pertinent negatives include no blurred vision. Risk factors for coronary artery disease include dyslipidemia, obesity, sedentary lifestyle and post-menopausal state. The current treatment provides moderate improvement.  Gastroesophageal Reflux She complains of belching and heartburn. This is a chronic problem. The current episode started more than 1 year ago. The problem occurs occasionally. The symptoms are aggravated by certain foods. She has tried a PPI for the symptoms. The treatment provided moderate relief.  Arthritis Presents for follow-up visit. She complains of pain and stiffness. The symptoms have been stable. Affected locations include the left knee, right knee, left MCP and right MCP. Her pain is at a severity of 10/10 (at times).  Hyperlipidemia This is a chronic problem. The current episode started more than 1 year ago. The problem is controlled. Recent lipid tests were reviewed and are normal. Exacerbating diseases include obesity. Current antihyperlipidemic treatment includes statins. The current treatment provides moderate improvement of lipids. Risk factors for coronary artery disease include dyslipidemia, hypertension, a sedentary lifestyle, post-menopausal and obesity.  Back Pain This is a chronic problem. The current episode started more than 1 year ago. The problem occurs intermittently. The problem has been waxing and waning since onset. The pain is  present in the lumbar spine. The quality of the pain is described as aching. The pain is at a severity of 7/10. The pain is moderate. She has tried bed rest for the symptoms. The treatment provided mild relief.  Anxiety Presents for follow-up visit. Symptoms include excessive worry, nervous/anxious behavior and restlessness. Symptoms occur occasionally. The severity of symptoms is mild.    Diabetes Diabetes type: prediabetes. Hypoglycemia symptoms include nervousness/anxiousness. Pertinent negatives for diabetes include no blurred vision and no foot paresthesias. Risk factors for coronary artery disease include dyslipidemia, sedentary lifestyle, post-menopausal and hypertension. She is following a generally healthy diet. (Does not check glucose at home)      Review of Systems  Constitutional:  Positive for malaise/fatigue.  Eyes:  Negative for blurred vision.  Gastrointestinal:  Positive for heartburn.  Musculoskeletal:  Positive for back pain and stiffness.  Psychiatric/Behavioral:  The patient is nervous/anxious.   All other systems reviewed and are negative.  Family History  Problem Relation Age of Onset   Heart disease Mother    Stroke Father    Hypertension Sister    Hypertension Brother    Hypertension Sister    Hypertension Sister    Hypertension Brother    Hypertension Brother    Social History   Socioeconomic History   Marital status: Widowed    Spouse name: Not on file   Number of children: 2   Years of education: Not on file   Highest education level: Not on file  Occupational History   Occupation: retired  Tobacco Use   Smoking status: Former    Current packs/day: 0.25    Average packs/day: 0.3 packs/day for 25.0 years (6.3 ttl pk-yrs)    Types: Cigarettes   Smokeless  tobacco: Never  Vaping Use   Vaping status: Never Used  Substance and Sexual Activity   Alcohol use: No   Drug use: No   Sexual activity: Never  Other Topics Concern   Not on file   Social History Narrative   She lives alone in [place] - ground level apartment. Her 2 sons live in [place]   Social Drivers of Health   Tobacco Use: Medium Risk (10/18/2024)   Patient History    Smoking Tobacco Use: Former    Smokeless Tobacco Use: Never    Passive Exposure: Not on file  Financial Resource Strain: Low Risk (01/01/2024)   Overall Financial Resource Strain (CARDIA)    Difficulty of Paying Living Expenses: Not hard at all  Food Insecurity: No Food Insecurity (01/01/2024)   Hunger Vital Sign    Worried About Running Out of Food in the Last Year: Never true    Ran Out of Food in the Last Year: Never true  Transportation Needs: No Transportation Needs (10/09/2023)   PRAPARE - Administrator, Civil Service (Medical): No    Lack of Transportation (Non-Medical): No  Physical Activity: Sufficiently Active (01/01/2024)   Exercise Vital Sign    Days of Exercise per Week: 7 days    Minutes of Exercise per Session: 30 min  Stress: No Stress Concern Present (01/01/2024)   [employer] of Occupational Health - Occupational Stress Questionnaire    Feeling of Stress : Only a little  Social Connections: Socially Isolated (01/01/2024)   Social Connection and Isolation Panel    Frequency of Communication with Friends and Family: Once a week    Frequency of Social Gatherings with Friends and Family: Once a week    Attends Religious Services: Never    Database Administrator or Organizations: No    Attends Banker Meetings: Never    Marital Status: Widowed  Depression (PHQ2-9): Low Risk (10/18/2024)   Depression (PHQ2-9)    PHQ-2 Score: 0  Alcohol Screen: Low Risk (01/01/2024)   Alcohol Screen    Last Alcohol Screening Score (AUDIT): 0  Housing: Low Risk (01/01/2024)   Housing Stability Vital Sign    Unable to Pay for Housing in the Last Year: No    Number of Times Moved in the Last Year: 0    Homeless in the Last Year: No  Utilities: Not At Risk  (01/01/2024)   [facility] Utilities    Threatened with loss of utilities: No  Recent Concern: Utilities - At Risk (10/03/2023)   [facility] Utilities    Threatened with loss of utilities: Yes  Health Literacy: Adequate Health Literacy (01/01/2024)   B1300 Health Literacy    Frequency of need for help with medical instructions: Never       Objective:   Physical Exam Vitals reviewed.  Constitutional:      General: She is not in acute distress.    Appearance: She is well-developed. She is obese.  HENT:     Head: Normocephalic and atraumatic.     Right Ear: Tympanic membrane normal.     Left Ear: Tympanic membrane normal.     Nose:     Right Sinus: Frontal sinus tenderness present.     Left Sinus: Frontal sinus tenderness present.  Eyes:     Pupils: Pupils are equal, round, and reactive to light.  Neck:     Thyroid : No thyromegaly.  Cardiovascular:     Rate and Rhythm: Normal rate and regular rhythm.  Heart sounds: Normal heart sounds. No murmur heard. Pulmonary:     Effort: Pulmonary effort is normal. No respiratory distress.     Breath sounds: Normal breath sounds. No wheezing.  Abdominal:     General: Bowel sounds are normal. There is no distension.     Palpations: Abdomen is soft.     Tenderness: There is no abdominal tenderness.  Musculoskeletal:        General: Tenderness present.     Cervical back: Normal range of motion and neck supple.     Right lower leg: Edema (2+) present.     Left lower leg: Edema (2+) present.     Comments: Pain in bilateral knees with flexion and extension  Skin:    General: Skin is warm and dry.  Neurological:     Mental Status: She is alert and oriented to person, place, and time.     Cranial Nerves: No cranial nerve deficit.     Deep Tendon Reflexes: Reflexes are normal and symmetric.  Psychiatric:        Behavior: Behavior normal.        Thought Content: Thought content normal.        Judgment: Judgment normal.     right knee prepped with  betadine Injected with Marcaine  .5% plain and methylprednisolone  with 25 guage needle x 1. Patient tolerated well.  Left knee prepped with betadine Injected with Marcaine  .5% plain and methylprednisolone  with 25 guage needle x 1. Patient tolerated well.   BP 131/81   Pulse 90   Temp 97.8 F (36.6 C) (Temporal)   Ht 5' 3 (1.6 m)   Wt 169 lb 6.4 oz (76.8 kg)   SpO2 96%   BMI 30.01 kg/m      Assessment & Plan:  [place] comes in today with chief complaint of Medical Management of Chronic Issues   Diagnosis and orders addressed:  1. Primary hypertension (Primary) - hydrochlorothiazide  (HYDRODIURIL ) 25 MG tablet; Take 1 tablet (25 mg total) by mouth daily.  Dispense: 90 tablet; Refill: 1 - CMP14+EGFR  2. Morbid obesity (HCC) - CMP14+EGFR  3. Chronic bilateral low back pain with right-sided sciatica - CMP14+EGFR  4. GAD (generalized anxiety disorder) - CMP14+EGFR  5. Gastroesophageal reflux disease with esophagitis, unspecified whether hemorrhage - CMP14+EGFR  6. Hyperlipidemia, unspecified hyperlipidemia type - CMP14+EGFR  7. Obesity (BMI 30-39.9) - CMP14+EGFR  8. Vitamin D  deficiency - CMP14+EGFR  9. Prediabetes - CMP14+EGFR  10. Primary osteoarthritis involving multiple joints Bilateral knee injection given Pt tolerated well - CMP14+EGFR - bupivacaine  (MARCAINE ) 0.25 % (with pres) injection 1 mL - methylPREDNISolone  acetate (DEPO-MEDROL ) injection 80 mg - bupivacaine  (MARCAINE ) 0.25 % (with pres) injection 1 mL - methylPREDNISolone  acetate (DEPO-MEDROL ) injection 80 mg    Labs pending Continue current medications  Health Maintenance reviewed Diet and exercise encouraged  Follow up plan: 4 months    Bari Learn, FNP   "

## 2024-10-18 NOTE — Patient Instructions (Signed)

## 2024-10-19 ENCOUNTER — Ambulatory Visit: Payer: Self-pay | Admitting: Family

## 2024-10-19 LAB — CMP14+EGFR
ALT: 21 IU/L (ref 0–32)
AST: 29 IU/L (ref 0–40)
Albumin: 4.5 g/dL (ref 3.7–4.7)
Alkaline Phosphatase: 86 IU/L (ref 48–129)
BUN/Creatinine Ratio: 18 (ref 12–28)
BUN: 18 mg/dL (ref 8–27)
Bilirubin Total: 0.3 mg/dL (ref 0.0–1.2)
CO2: 24 mmol/L (ref 20–29)
Calcium: 10.5 mg/dL — ABNORMAL HIGH (ref 8.7–10.3)
Chloride: 98 mmol/L (ref 96–106)
Creatinine, Ser: 1.01 mg/dL — ABNORMAL HIGH (ref 0.57–1.00)
Globulin, Total: 2.8 g/dL (ref 1.5–4.5)
Glucose: 118 mg/dL — ABNORMAL HIGH (ref 70–99)
Potassium: 3.5 mmol/L (ref 3.5–5.2)
Sodium: 141 mmol/L (ref 134–144)
Total Protein: 7.3 g/dL (ref 6.0–8.5)
eGFR: 56 mL/min/1.73 — ABNORMAL LOW

## 2024-10-21 DIAGNOSIS — M170 Bilateral primary osteoarthritis of knee: Secondary | ICD-10-CM | POA: Diagnosis not present

## 2024-10-29 ENCOUNTER — Ambulatory Visit: Payer: Self-pay

## 2024-10-29 DIAGNOSIS — J019 Acute sinusitis, unspecified: Secondary | ICD-10-CM

## 2024-10-29 MED ORDER — DOXYCYCLINE HYCLATE 100 MG PO CAPS: 100.0000 mg | ORAL_CAPSULE | Freq: Two times a day (BID) | ORAL | 0 refills | Status: AC

## 2024-10-29 NOTE — Telephone Encounter (Signed)
 FYI Only or Action Required?: Action required by provider: request for antibiotics.  Patient was last seen in primary care on 10/18/2024 by Lavell Bari LABOR, FNP.  Called Nurse Triage reporting No chief complaint on file..  Symptoms began a week ago.  Interventions attempted: Rest, hydration, or home remedies.  Symptoms are: gradually worsening.  Triage Disposition: No disposition on file.  Patient/caregiver understands and will follow disposition?:  Reason for Triage: PT has severe Sinus infection/For 1 week/headache fatigue/Stuffy head/Hard to sleep/Gum ache.    Reason for Disposition  Earache  Answer Assessment - Initial Assessment Questions No aval. Appt today. Declines virtual appt. Requesting antibiotics from MD.    1. LOCATION: Where does it hurt?      Gums, between eyes, forehead 2. ONSET: When did the sinus pain start?  (e.g., hours, days)      Few days ago 3. SEVERITY: How bad is the pain?   (Scale 0-10; or none, mild, moderate or severe)     moderate 4. RECURRENT SYMPTOM: Have you ever had sinus problems before? If Yes, ask: When was the last time? and What happened that time?       5. NASAL CONGESTION: Is the nose blocked? If Yes, ask: Can you open it or must you breathe through your mouth?     yes 6. NASAL DISCHARGE: Do you have discharge from your nose? If so ask, What color?     clear 7. FEVER: Do you have a fever? If Yes, ask: What is it, how was it measured, and when did it start?      no 8. OTHER SYMPTOMS: Do you have any other symptoms? (e.g., sore throat, cough, earache, difficulty breathing)     Ear ache  Protocols used: Sinus Pain or Congestion-A-AH

## 2024-10-29 NOTE — Telephone Encounter (Signed)
 Called and spoke with patient and offered to do a video visit. She does not have a smart phone and asked that I send a message any way to request meds. Please advise

## 2024-10-29 NOTE — Telephone Encounter (Signed)
 Called and LM notifying pt that RX has been sent to pharmacy

## 2024-10-29 NOTE — Telephone Encounter (Signed)
Doxycycline Prescription sent to pharmacy

## 2025-02-15 ENCOUNTER — Ambulatory Visit: Admitting: Family
